# Patient Record
Sex: Female | Born: 1960 | Race: White | Hispanic: No | Marital: Single | Smoking: Former smoker
Health system: Southern US, Community
[De-identification: ages and names within clinical notes are randomized; demographics above are authoritative.]

## PROBLEM LIST (undated history)

## (undated) DIAGNOSIS — Z923 Personal history of irradiation: Secondary | ICD-10-CM

## (undated) DIAGNOSIS — C3490 Malignant neoplasm of unspecified part of unspecified bronchus or lung: Secondary | ICD-10-CM

## (undated) DIAGNOSIS — Z9221 Personal history of antineoplastic chemotherapy: Secondary | ICD-10-CM

## (undated) DIAGNOSIS — IMO0002 Reserved for concepts with insufficient information to code with codable children: Secondary | ICD-10-CM

## (undated) DIAGNOSIS — D649 Anemia, unspecified: Secondary | ICD-10-CM

## (undated) DIAGNOSIS — IMO0001 Reserved for inherently not codable concepts without codable children: Secondary | ICD-10-CM

## (undated) DIAGNOSIS — I82409 Acute embolism and thrombosis of unspecified deep veins of unspecified lower extremity: Secondary | ICD-10-CM

## (undated) DIAGNOSIS — C799 Secondary malignant neoplasm of unspecified site: Secondary | ICD-10-CM

## (undated) DIAGNOSIS — Z66 Do not resuscitate: Secondary | ICD-10-CM

## (undated) DIAGNOSIS — N390 Urinary tract infection, site not specified: Secondary | ICD-10-CM

## (undated) DIAGNOSIS — E669 Obesity, unspecified: Secondary | ICD-10-CM

## (undated) DIAGNOSIS — Z5189 Encounter for other specified aftercare: Secondary | ICD-10-CM

## (undated) DIAGNOSIS — Z8719 Personal history of other diseases of the digestive system: Secondary | ICD-10-CM

## (undated) DIAGNOSIS — Z8742 Personal history of other diseases of the female genital tract: Secondary | ICD-10-CM

## (undated) HISTORY — DX: Obesity, unspecified: E66.9

## (undated) HISTORY — DX: Acute embolism and thrombosis of unspecified deep veins of unspecified lower extremity: I82.409

## (undated) HISTORY — DX: Personal history of irradiation: Z92.3

## (undated) HISTORY — DX: Urinary tract infection, site not specified: N39.0

## (undated) HISTORY — DX: Secondary malignant neoplasm of unspecified site: C79.9

## (undated) HISTORY — DX: Do not resuscitate: Z66

## (undated) HISTORY — DX: Personal history of antineoplastic chemotherapy: Z92.21

---

## 1997-11-07 ENCOUNTER — Emergency Department (HOSPITAL_COMMUNITY): Admission: EM | Admit: 1997-11-07 | Discharge: 1997-11-07 | Payer: Self-pay | Admitting: Emergency Medicine

## 1998-02-21 ENCOUNTER — Encounter: Payer: Self-pay | Admitting: *Deleted

## 1998-02-21 ENCOUNTER — Ambulatory Visit (HOSPITAL_COMMUNITY): Admission: RE | Admit: 1998-02-21 | Discharge: 1998-02-21 | Payer: Self-pay | Admitting: *Deleted

## 1998-04-04 ENCOUNTER — Ambulatory Visit (HOSPITAL_COMMUNITY): Admission: RE | Admit: 1998-04-04 | Discharge: 1998-04-04 | Payer: Self-pay | Admitting: *Deleted

## 1998-04-04 ENCOUNTER — Encounter: Payer: Self-pay | Admitting: *Deleted

## 1998-04-19 ENCOUNTER — Ambulatory Visit (HOSPITAL_COMMUNITY): Admission: RE | Admit: 1998-04-19 | Discharge: 1998-04-19 | Payer: Self-pay | Admitting: *Deleted

## 1998-04-19 ENCOUNTER — Encounter: Payer: Self-pay | Admitting: *Deleted

## 1999-12-05 ENCOUNTER — Encounter: Payer: Self-pay | Admitting: *Deleted

## 1999-12-05 ENCOUNTER — Ambulatory Visit (HOSPITAL_COMMUNITY): Admission: RE | Admit: 1999-12-05 | Discharge: 1999-12-05 | Payer: Self-pay | Admitting: *Deleted

## 1999-12-11 ENCOUNTER — Encounter: Payer: Self-pay | Admitting: *Deleted

## 1999-12-11 ENCOUNTER — Inpatient Hospital Stay (HOSPITAL_COMMUNITY): Admission: AD | Admit: 1999-12-11 | Discharge: 1999-12-14 | Payer: Self-pay | Admitting: Internal Medicine

## 1999-12-13 ENCOUNTER — Encounter: Payer: Self-pay | Admitting: *Deleted

## 1999-12-28 ENCOUNTER — Encounter: Admission: RE | Admit: 1999-12-28 | Discharge: 1999-12-28 | Payer: Self-pay | Admitting: Internal Medicine

## 2000-02-06 ENCOUNTER — Encounter: Admission: RE | Admit: 2000-02-06 | Discharge: 2000-02-06 | Payer: Self-pay

## 2000-02-21 ENCOUNTER — Encounter: Admission: RE | Admit: 2000-02-21 | Discharge: 2000-02-21 | Payer: Self-pay | Admitting: Hematology and Oncology

## 2000-02-28 ENCOUNTER — Encounter: Admission: RE | Admit: 2000-02-28 | Discharge: 2000-02-28 | Payer: Self-pay | Admitting: Internal Medicine

## 2000-03-11 ENCOUNTER — Ambulatory Visit (HOSPITAL_COMMUNITY): Admission: RE | Admit: 2000-03-11 | Discharge: 2000-03-11 | Payer: Self-pay | Admitting: Cardiology

## 2000-03-11 ENCOUNTER — Encounter: Payer: Self-pay | Admitting: Internal Medicine

## 2000-04-02 ENCOUNTER — Encounter: Admission: RE | Admit: 2000-04-02 | Discharge: 2000-04-02 | Payer: Self-pay | Admitting: Obstetrics & Gynecology

## 2011-05-07 ENCOUNTER — Emergency Department (INDEPENDENT_AMBULATORY_CARE_PROVIDER_SITE_OTHER)
Admission: EM | Admit: 2011-05-07 | Discharge: 2011-05-07 | Disposition: A | Discharge status: Nursing Home (Includes ICF) | Payer: Self-pay | Source: Home / Self Care | Attending: Emergency Medicine | Admitting: Emergency Medicine

## 2011-05-07 ENCOUNTER — Emergency Department (HOSPITAL_COMMUNITY): Payer: Self-pay

## 2011-05-07 ENCOUNTER — Encounter (HOSPITAL_COMMUNITY): Payer: Self-pay

## 2011-05-07 ENCOUNTER — Encounter (HOSPITAL_COMMUNITY): Payer: Self-pay | Admitting: Emergency Medicine

## 2011-05-07 ENCOUNTER — Emergency Department (HOSPITAL_COMMUNITY)
Admission: EM | Admit: 2011-05-07 | Discharge: 2011-05-07 | Disposition: A | Discharge status: Home / Self Care | Payer: Self-pay | Attending: Emergency Medicine | Admitting: Emergency Medicine

## 2011-05-07 DIAGNOSIS — R110 Nausea: Secondary | ICD-10-CM | POA: Insufficient documentation

## 2011-05-07 DIAGNOSIS — K5900 Constipation, unspecified: Secondary | ICD-10-CM | POA: Insufficient documentation

## 2011-05-07 DIAGNOSIS — N858 Other specified noninflammatory disorders of uterus: Secondary | ICD-10-CM

## 2011-05-07 DIAGNOSIS — R1900 Intra-abdominal and pelvic swelling, mass and lump, unspecified site: Secondary | ICD-10-CM

## 2011-05-07 DIAGNOSIS — N9489 Other specified conditions associated with female genital organs and menstrual cycle: Secondary | ICD-10-CM | POA: Insufficient documentation

## 2011-05-07 DIAGNOSIS — R109 Unspecified abdominal pain: Secondary | ICD-10-CM | POA: Insufficient documentation

## 2011-05-07 LAB — CBC
Platelets: 392 10*3/uL (ref 150–400)
RBC: 4.45 MIL/uL (ref 3.87–5.11)
WBC: 17.2 10*3/uL — ABNORMAL HIGH (ref 4.0–10.5)

## 2011-05-07 LAB — URINALYSIS, ROUTINE W REFLEX MICROSCOPIC
Glucose, UA: NEGATIVE mg/dL
Hgb urine dipstick: NEGATIVE
Leukocytes, UA: NEGATIVE
Protein, ur: 30 mg/dL — AB
Specific Gravity, Urine: 1.016 (ref 1.005–1.030)
Urobilinogen, UA: 0.2 mg/dL (ref 0.0–1.0)

## 2011-05-07 LAB — POCT URINALYSIS DIP (DEVICE)
Glucose, UA: NEGATIVE mg/dL
Nitrite: NEGATIVE
Urobilinogen, UA: 0.2 mg/dL (ref 0.0–1.0)

## 2011-05-07 LAB — URINE MICROSCOPIC-ADD ON

## 2011-05-07 LAB — COMPREHENSIVE METABOLIC PANEL
ALT: 8 U/L (ref 0–35)
Alkaline Phosphatase: 62 U/L (ref 39–117)
CO2: 24 mEq/L (ref 19–32)
Glucose, Bld: 95 mg/dL (ref 70–99)
Potassium: 3.8 mEq/L (ref 3.5–5.1)
Sodium: 138 mEq/L (ref 135–145)
Total Protein: 8.2 g/dL (ref 6.0–8.3)

## 2011-05-07 LAB — DIFFERENTIAL
Lymphocytes Relative: 13 % (ref 12–46)
Lymphs Abs: 2.2 10*3/uL (ref 0.7–4.0)
Neutro Abs: 13.5 10*3/uL — ABNORMAL HIGH (ref 1.7–7.7)
Neutrophils Relative %: 78 % — ABNORMAL HIGH (ref 43–77)

## 2011-05-07 LAB — LIPASE, BLOOD: Lipase: 33 U/L (ref 11–59)

## 2011-05-07 LAB — PREGNANCY, URINE: Preg Test, Ur: NEGATIVE

## 2011-05-07 MED ORDER — HYDROMORPHONE HCL PF 1 MG/ML IJ SOLN
1.0000 mg | Freq: Once | INTRAMUSCULAR | Status: AC
  Administered 2011-05-07: 1 mg via INTRAVENOUS
  Filled 2011-05-07: qty 1

## 2011-05-07 MED ORDER — IOHEXOL 300 MG/ML  SOLN
100.0000 mL | Freq: Once | INTRAMUSCULAR | Status: AC | PRN
  Administered 2011-05-07: 100 mL via INTRAVENOUS

## 2011-05-07 MED ORDER — OXYCODONE-ACETAMINOPHEN 5-325 MG PO TABS
2.0000 | ORAL_TABLET | Freq: Once | ORAL | Status: AC
  Administered 2011-05-07: 2 via ORAL
  Filled 2011-05-07: qty 2

## 2011-05-07 MED ORDER — SODIUM CHLORIDE 0.9 % IV SOLN
Freq: Once | INTRAVENOUS | Status: AC
  Administered 2011-05-07: 19:00:00 via INTRAVENOUS

## 2011-05-07 MED ORDER — PROMETHAZINE HCL 25 MG PO TABS: 25.0000 mg | ORAL_TABLET | Freq: Four times a day (QID) | ORAL | Status: DC | PRN

## 2011-05-07 MED ORDER — ONDANSETRON HCL 4 MG/2ML IJ SOLN
4.0000 mg | Freq: Once | INTRAMUSCULAR | Status: AC
Start: 2011-05-07 — End: 2011-05-07
  Administered 2011-05-07: 4 mg via INTRAVENOUS
  Filled 2011-05-07: qty 2

## 2011-05-07 MED ORDER — OXYCODONE-ACETAMINOPHEN 5-325 MG PO TABS: 2.0000 | ORAL_TABLET | ORAL | Status: DC | PRN

## 2011-05-07 NOTE — ED Notes (Signed)
C/o lower abdominal pain that radiates around to her lower back for 3 weeks.  States has been getting progressively worse.  States temp of 100 last night.  States she has a lot of abdominal pressure before urination but no painful urination or hematuria.  Lance Bosch has experienced some nausea but denies vomiting, diarrhea or constipation.

## 2011-05-07 NOTE — ED Provider Notes (Signed)
History     CSN: [id number]  Arrival date & time 05/07/11  1547   First MD Initiated Contact with Patient 05/07/11 1807      Chief Complaint  Patient presents with  . Abdominal Pain    (Consider location/radiation/quality/duration/timing/severity/associated sxs/prior treatment) HPI  History reviewed. No pertinent past medical history.  History reviewed. No pertinent past surgical history.  No family history on file.  History  Substance Use Topics  . Smoking status: Current Everyday Smoker -- 1.0 packs/day  . Smokeless tobacco: Not on file  . Alcohol Use: No    OB History    Grav Para Term Preterm Abortions TAB SAB Ect Mult Living                  Review of Systems  Allergies  Aspirin; Morphine and related; and Penicillins  Home Medications  No current outpatient prescriptions on file.  BP 118/70  Pulse 91  Temp(Src) 99 F (37.2 C) (Oral)  Resp 18  SpO2 97%  LMP 03/30/2011  Physical Exam  ED Course  Procedures (including critical care time)   Labs Reviewed  CBC  DIFFERENTIAL  COMPREHENSIVE METABOLIC PANEL  URINALYSIS, ROUTINE W REFLEX MICROSCOPIC  PREGNANCY, URINE  LIPASE, BLOOD   No results found.   No diagnosis found.    MDM  2000 Report received from Drs. Estell Harpin on a 51 year old female presenting with abdominal pain and lower back pain x3 weeks. Palpable abdominal mass. Will be moving her to CDU for a CT of the abdomen after her pregnancy test is back.  10pm Radiologist reports that Jackie Cook has a large uterine mass with right hydronephrosis and lymph edema.    63 Spoke with GYN Oncology nurse Telford Nab.  Will call in am for appointment to schedule biopsy.    10:30  Spoke with urologist about the R hydronephrosis and he said she can follow up tomorrow at the office.         Jethro Bastos, NP 05/08/11 1143

## 2011-05-07 NOTE — ED Notes (Signed)
Pt c/o pain at periumbilical area radiating to lower abd and into back.  Onset 3 weeks ago.  Pt was seen at Urgent Care and sent to ED.  Pt denies any vomiting

## 2011-05-07 NOTE — ED Provider Notes (Signed)
History     CSN: [id number]  Arrival date & time 05/07/11  1357   First MD Initiated Contact with Patient 05/07/11 1434      Chief Complaint  Patient presents with  . Abdominal Pain  . Back Pain    (Consider location/radiation/quality/duration/timing/severity/associated sxs/prior treatment) HPI Comments: The pain and pressure has been getting worse" " the pain shoots to my back goes from my sides" (points to infraumbilical region), feeling chills - nauseous" For week been feeling some pressure" But it has gotten worse " " I don't have insurance",  Patient is a 51 y.o. female presenting with abdominal pain and back pain. The history is provided by the patient.  Abdominal Pain The primary symptoms of the illness include abdominal pain, fever, fatigue and nausea. The primary symptoms of the illness do not include shortness of breath, vomiting, diarrhea, dysuria, vaginal discharge or vaginal bleeding. The problem has been gradually worsening.  The patient states that she believes she is currently not pregnant. Additional symptoms associated with the illness include chills, anorexia and back pain. Symptoms associated with the illness do not include constipation.  Back Pain  Associated symptoms include a fever and abdominal pain. Pertinent negatives include no dysuria.    History reviewed. No pertinent past medical history.  History reviewed. No pertinent past surgical history.  No family history on file.  History  Substance Use Topics  . Smoking status: Current Everyday Smoker -- 1.0 packs/day  . Smokeless tobacco: Not on file  . Alcohol Use: No    OB History    Grav Para Term Preterm Abortions TAB SAB Ect Mult Living                  Review of Systems  Constitutional: Positive for fever, chills and fatigue.  Respiratory: Negative for shortness of breath.   Gastrointestinal: Positive for nausea, abdominal pain and anorexia. Negative for vomiting, diarrhea and constipation.    Genitourinary: Negative for dysuria, vaginal bleeding and vaginal discharge.  Musculoskeletal: Positive for back pain.    Allergies  Aspirin; Morphine and related; and Penicillins  Home Medications  No current outpatient prescriptions on file.  BP 151/83  Pulse 90  Temp(Src) 99.7 F (37.6 C) (Oral)  Resp 17  SpO2 100%  LMP 03/30/2011  Physical Exam  Nursing note and vitals reviewed. Constitutional: She appears well-developed. No distress.  HENT:  Head: Normocephalic.  Eyes: Scleral icterus is present.  Neck: Neck supple.  Abdominal: She exhibits mass. She exhibits no pulsatile liver. There is tenderness. There is no guarding.    Skin: No rash noted.    ED Course  Procedures (including critical care time)  Labs Reviewed  POCT URINALYSIS DIP (DEVICE) - Abnormal; Notable for the following:    Hgb urine dipstick TRACE (*)    All other components within normal limits  POCT URINALYSIS DIPSTICK   No results found.   No diagnosis found.    MDM  Lower abdominal pain- for 3 weeks with posterior back radiation. No dysuria. Reports low grade fevers- palpable abdominal mass and icterus noted- Suspect oncological pelvic process-Patient transferred to the ED for further evaluation        Jimmie Molly, MD 05/07/11 1559

## 2011-05-07 NOTE — ED Provider Notes (Signed)
History     CSN: [id number]  Arrival date & time 05/07/11  1547   First MD Initiated Contact with Patient 05/07/11 1807      Chief Complaint  Patient presents with  . Abdominal Pain    (Consider location/radiation/quality/duration/timing/severity/associated sxs/prior treatment) Patient is a 51 y.o. female presenting with abdominal pain. The history is provided by the patient (Patient complains of lower abdominal pain for 3 weeks mild nausea no vomiting.). No language interpreter was used.  Abdominal Pain The primary symptoms of the illness include abdominal pain. The primary symptoms of the illness do not include fatigue, shortness of breath or diarrhea. The current episode started more than 2 days ago. The onset of the illness was gradual. The problem has been gradually worsening.  Associated with: nothing. The patient states that she believes she is currently not pregnant. The patient has not had a change in bowel habit. Additional symptoms associated with the illness include constipation. Symptoms associated with the illness do not include heartburn, hematuria, frequency or back pain. Significant associated medical issues do not include diverticulitis.    History reviewed. No pertinent past medical history.  History reviewed. No pertinent past surgical history.  No family history on file.  History  Substance Use Topics  . Smoking status: Current Everyday Smoker -- 1.0 packs/day  . Smokeless tobacco: Not on file  . Alcohol Use: No    OB History    Grav Para Term Preterm Abortions TAB SAB Ect Mult Living                  Review of Systems  Constitutional: Negative for fatigue.  HENT: Negative for congestion, sinus pressure and ear discharge.   Eyes: Negative for discharge.  Respiratory: Negative for cough and shortness of breath.   Cardiovascular: Negative for chest pain.  Gastrointestinal: Positive for abdominal pain and constipation. Negative for heartburn and  diarrhea.  Genitourinary: Negative for frequency and hematuria.  Musculoskeletal: Negative for back pain.  Skin: Negative for rash.  Neurological: Negative for seizures and headaches.  Hematological: Negative.   Psychiatric/Behavioral: Negative for hallucinations.    Allergies  Aspirin; Morphine and related; and Penicillins  Home Medications  No current outpatient prescriptions on file.  BP 118/70  Pulse 91  Temp(Src) 99 F (37.2 C) (Oral)  Resp 18  SpO2 97%  LMP 03/30/2011  Physical Exam  Constitutional: She is oriented to person, place, and time. She appears well-developed.  HENT:  Head: Normocephalic and atraumatic.  Eyes: Conjunctivae and EOM are normal. No scleral icterus.  Neck: Neck supple. No thyromegaly present.  Cardiovascular: Normal rate and regular rhythm.  Exam reveals no gallop and no friction rub.   No murmur heard. Pulmonary/Chest: No stridor. She has no wheezes. She has no rales. She exhibits no tenderness.  Abdominal: She exhibits no distension. There is tenderness. There is no rebound.       Pt has a large mass inferior to her umbilicus  Musculoskeletal: Normal range of motion. She exhibits no edema.  Lymphadenopathy:    She has no cervical adenopathy.  Neurological: She is oriented to person, place, and time. Coordination normal.  Skin: No rash noted. No erythema.  Psychiatric: She has a normal mood and affect. Her behavior is normal.    ED Course  Procedures (including critical care time)  Labs Reviewed  CBC - Abnormal; Notable for the following:    WBC 17.2 (*)    Hemoglobin 11.9 (*)    HCT 34.2 (*)  MCV 76.9 (*)    RDW 15.6 (*)    All other components within normal limits  DIFFERENTIAL - Abnormal; Notable for the following:    Neutrophils Relative 78 (*)    Neutro Abs 13.5 (*)    Monocytes Absolute 1.2 (*)    All other components within normal limits  COMPREHENSIVE METABOLIC PANEL - Abnormal; Notable for the following:    GFR calc  non Af Amer 65 (*)    GFR calc Af Amer 76 (*)    All other components within normal limits  URINALYSIS, ROUTINE W REFLEX MICROSCOPIC - Abnormal; Notable for the following:    APPearance HAZY (*)    Protein, ur 30 (*)    All other components within normal limits  URINE MICROSCOPIC-ADD ON - Abnormal; Notable for the following:    Squamous Epithelial / LPF MANY (*)    Bacteria, UA MANY (*)    All other components within normal limits  LIPASE, BLOOD  POCT PREGNANCY, URINE  PREGNANCY, URINE   No results found.   No diagnosis found. Results for orders placed during the hospital encounter of 05/07/11  CBC      Component Value Range   WBC 17.2 (*) 4.0 - 10.5 (K/uL)   RBC 4.45  3.87 - 5.11 (MIL/uL)   Hemoglobin 11.9 (*) 12.0 - 15.0 (g/dL)   HCT 96.0 (*) 45.4 - 46.0 (%)   MCV 76.9 (*) 78.0 - 100.0 (fL)   MCH 26.7  26.0 - 34.0 (pg)   MCHC 34.8  30.0 - 36.0 (g/dL)   RDW 09.8 (*) 11.9 - 15.5 (%)   Platelets 392  150 - 400 (K/uL)  DIFFERENTIAL      Component Value Range   Neutrophils Relative 78 (*) 43 - 77 (%)   Neutro Abs 13.5 (*) 1.7 - 7.7 (K/uL)   Lymphocytes Relative 13  12 - 46 (%)   Lymphs Abs 2.2  0.7 - 4.0 (K/uL)   Monocytes Relative 7  3 - 12 (%)   Monocytes Absolute 1.2 (*) 0.1 - 1.0 (K/uL)   Eosinophils Relative 2  0 - 5 (%)   Eosinophils Absolute 0.3  0.0 - 0.7 (K/uL)   Basophils Relative 0  0 - 1 (%)   Basophils Absolute 0.0  0.0 - 0.1 (K/uL)  COMPREHENSIVE METABOLIC PANEL      Component Value Range   Sodium 138  135 - 145 (mEq/L)   Potassium 3.8  3.5 - 5.1 (mEq/L)   Chloride 103  96 - 112 (mEq/L)   CO2 24  19 - 32 (mEq/L)   Glucose, Bld 95  70 - 99 (mg/dL)   BUN 8  6 - 23 (mg/dL)   Creatinine, Ser 1.47  0.50 - 1.10 (mg/dL)   Calcium 82.9  8.4 - 10.5 (mg/dL)   Total Protein 8.2  6.0 - 8.3 (g/dL)   Albumin 3.5  3.5 - 5.2 (g/dL)   AST 13  0 - 37 (U/L)   ALT 8  0 - 35 (U/L)   Alkaline Phosphatase 62  39 - 117 (U/L)   Total Bilirubin 0.8  0.3 - 1.2 (mg/dL)   GFR  calc non Af Amer 65 (*) >90 (mL/min)   GFR calc Af Amer 76 (*) >90 (mL/min)  URINALYSIS, ROUTINE W REFLEX MICROSCOPIC      Component Value Range   Color, Urine YELLOW  YELLOW    APPearance HAZY (*) CLEAR    Specific Gravity, Urine 1.016  1.005 - 1.030  pH 6.0  5.0 - 8.0    Glucose, UA NEGATIVE  NEGATIVE (mg/dL)   Hgb urine dipstick NEGATIVE  NEGATIVE    Bilirubin Urine NEGATIVE  NEGATIVE    Ketones, ur NEGATIVE  NEGATIVE (mg/dL)   Protein, ur 30 (*) NEGATIVE (mg/dL)   Urobilinogen, UA 0.2  0.0 - 1.0 (mg/dL)   Nitrite NEGATIVE  NEGATIVE    Leukocytes, UA NEGATIVE  NEGATIVE   PREGNANCY, URINE      Component Value Range   Preg Test, Ur NEGATIVE    LIPASE, BLOOD      Component Value Range   Lipase 33  11 - 59 (U/L)  POCT PREGNANCY, URINE      Component Value Range   Preg Test, Ur NEGATIVE    URINE MICROSCOPIC-ADD ON      Component Value Range   Squamous Epithelial / LPF MANY (*) RARE    WBC, UA 0-2  <3 (WBC/hpf)   Bacteria, UA MANY (*) RARE    Urine-Other AMORPHOUS URATES/PHOSPHATES     Ct Abdomen Pelvis W Contrast  05/07/2011  *RADIOLOGY REPORT*  Clinical Data: Abdominal pain radiating into the groin.  Nausea and chills.  CT ABDOMEN AND PELVIS WITH CONTRAST  Technique:  Multidetector CT imaging of the abdomen and pelvis was performed following the standard protocol during bolus administration of intravenous contrast.  Contrast: OMNIPAQUE IOHEXOL 300 MG/ML IV SOLN  Comparison: None.  Findings: The lung bases are clear.  No pleural or pericardial effusion.  The patient has a large mass in the pelvis measuring approximately 20 cm AP by 16.7 cm cranial-caudal by 16 cm transverse.  Although difficult to definitively characterize, this appears to be a massively enlarged uterus.  There is a large area of central low attenuation superiorly within this mass compatible with necrosis. A second pedunculated lesion is seen off the left aspect of the main mass which measures 10.3 cm AP by 8.8  cm transverse by 10.7 cm cranial-caudal.  There is also a low attenuating collection along the right aspect of the main mass measuring 8.3 cm AP by 5.3 cm transverse by 7.7 cm cranial-caudal.  Posterior to the main lesion along the right iliac vessels, there is a large nodal mass measuring 11.1 cm transverse by 7.5 cm AP.  This lesion encases the right iliac vessels.  The patient has severe right hydronephrosis with the point of compression of the right ureter between the nodal mass and the main pelvic mass.  Multiple smaller retroperitoneal lymph nodes are seen cephalad to the main lesion.  The urinary bladder is nearly completely decompressed.  Ovaries are not discretely visualized.  The liver, spleen, adrenal glands and left kidney are unremarkable. Punctate nonobstructing stone lower pole right kidney is noted. The stomach and small and large bowel appear normal.  No focal bony abnormality.  IMPRESSION:  1.  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  There is associated bulky lymphadenopathy in the pelvis with encasement and compression of the right iliac vessels.  Severe right hydronephrosis is again noted with the point of obstruction between the patient's dominant mass and right pelvic lymph nodes. 2.  Nonobstructing stone lower pole right kidney. 3.  Critical Value/emergent results were called by telephone at the time of interpretation on 05/07/2011  at 10:00 p.m.  to Thurston Hole, nurse practitioner, who verbally acknowledged these results.  Original Report Authenticated By: Bernadene Bell. Maricela Curet, M.D.       MDM  Benny Lennert, MD 05/08/11 1209

## 2011-05-08 NOTE — ED Provider Notes (Signed)
Medical screening examination/treatment/procedure(s) were performed by non-physician practitioner and as supervising physician I was immediately available for consultation/collaboration.   Kitiara Hintze L Cedra Villalon, MD 05/08/11 1202 

## 2011-05-10 ENCOUNTER — Encounter: Payer: Self-pay | Admitting: Gynecologic Oncology

## 2011-05-11 ENCOUNTER — Ambulatory Visit: Payer: Self-pay | Attending: Gynecology | Admitting: Gynecology

## 2011-05-11 ENCOUNTER — Encounter: Payer: Self-pay | Admitting: Gynecology

## 2011-05-11 ENCOUNTER — Other Ambulatory Visit (HOSPITAL_COMMUNITY)
Admission: RE | Admit: 2011-05-11 | Discharge: 2011-05-11 | Disposition: A | Discharge status: Home / Self Care | Payer: Self-pay | Source: Ambulatory Visit | Attending: Gynecology | Admitting: Gynecology

## 2011-05-11 VITALS — BP 128/62 | HR 80 | Temp 98.9°F | Resp 16 | Ht 65.0 in | Wt 235.4 lb

## 2011-05-11 DIAGNOSIS — R1909 Other intra-abdominal and pelvic swelling, mass and lump: Secondary | ICD-10-CM | POA: Insufficient documentation

## 2011-05-11 DIAGNOSIS — R634 Abnormal weight loss: Secondary | ICD-10-CM | POA: Insufficient documentation

## 2011-05-11 DIAGNOSIS — Z01419 Encounter for gynecological examination (general) (routine) without abnormal findings: Secondary | ICD-10-CM | POA: Insufficient documentation

## 2011-05-11 DIAGNOSIS — R1900 Intra-abdominal and pelvic swelling, mass and lump, unspecified site: Secondary | ICD-10-CM | POA: Insufficient documentation

## 2011-05-11 DIAGNOSIS — F17200 Nicotine dependence, unspecified, uncomplicated: Secondary | ICD-10-CM | POA: Insufficient documentation

## 2011-05-11 NOTE — Patient Instructions (Signed)
OR is scheduled for February 5

## 2011-05-11 NOTE — Progress Notes (Signed)
Consult Note: Gyn-Onc   Jackie Cook 51 y.o. female  Chief Complaint  Patient presents with  . Pelvic Mass    New pt       HPI:  51-year-old white female seen in consultation and management of a newly diagnosed abdominal pelvic mass.  The patient has had approximately 3 weeks of abdominal pain and saw her primary care provider who found a large mass on exam. Subsequently she's had a CT scan which shows a 20 x 16 x 16 cm abdominal pelvic mass. This is thought to be a large uterus with a central area of low attenuation consistent with necrosis. There is some evidence of a pedunculated mass measuring 10 x 8.8 x 10.7 cm posterior to the main lesion along the right iliac vessels is a large nodal mass measuring 11 x 7.5 cm which encased the iliac vessels and ureter creating a right hydronephrosis which is severe. The liver spleen and adrenal glands and left kidney are otherwise unremarkable there is no evidence of ascites. The patient denies any past gynecologic history. She reports she continues to have regular cyclic menses. She has not had a Pap smear in several years.  Allergies  Allergen Reactions  . Aspirin     GI upset  . Morphine And Related Itching  . Penicillins Itching    History reviewed. No pertinent past medical history.  Past Surgical History  Procedure Date  . No past surgeries     Current Outpatient Prescriptions  Medication Sig Dispense Refill  . Multiple Vitamins-Minerals (MULTIVITAMIN PO) Take 1 tablet by mouth daily.      . oxyCODONE-acetaminophen (PERCOCET) 5-325 MG per tablet Take 2 tablets by mouth every 4 (four) hours as needed for pain.  20 tablet  0  . promethazine (PHENERGAN) 25 MG tablet Take 1 tablet (25 mg total) by mouth every 6 (six) hours as needed for nausea.  30 tablet  0    History   Social History  . Marital Status: Single    Spouse Name: N/A    Number of Children: N/A  . Years of Education: N/A   Occupational History  . Not on  file.   Social History Main Topics  . Smoking status: Current Everyday Smoker -- 1.0 packs/day  . Smokeless tobacco: Not on file  . Alcohol Use: No  . Drug Use: No  . Sexually Active: No   Other Topics Concern  . Not on file   Social History Narrative  . No narrative on file    Family History  Problem Relation Age of Onset  . Breast cancer Maternal Aunt   . Heart attack Other     Review of Systems: The patient admits to 50 pound weight loss over the past 6 months. Otherwise her review of systems is negative except as noted above.  Vitals: Blood pressure 128/62, pulse 80, temperature 98.9 F (37.2 C), temperature source Oral, resp. rate 16, height 5' 5" (1.651 m), weight 235 lb 6.4 oz (106.777 kg), last menstrual period 04/09/2011.  Physical Exam: In general the patient is a pleasant but anxious white female in no acute distress  HEENT is negative  Neck is supple without thyromegaly.  There is no supraclavicular or inguinal adenopathy.  Abdominal exam reveals a large mass extending from the lower abdomen into the upper abdomen. This is moderately painful without any rebound. I do not detect any ascites.  Ellik exam  EGBUS vagina bladder urethra are normal.  The cervix is deviated   far anteriorly and superiorly and cannot be identified with a speculum nor can I palpate it with my vaginal exam finger. Bimanual and rectovaginal exam revealed no masses deep in the pelvis. There is no cul-de-sac nodularity. 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 The unusual features it is the nodal mass on the right side of the pelvis obstructing the ureter and encasing the blood vessels.  A Pap smear was obtained but I was unable to obtain an endometrial biopsy  .Assessment/Plan:  I recommend that the patient undergo exploratory laparotomy with an attempt to resect the majority of the mass which I believe is the uterus. I did indicate to the  patient that it is unlikely we would be able to resect the right pelvic sidewall mass. Clearly we need to establish a diagnosis before any further therapy can be recommended. She is aware that this is major surgery and carries risks including hemorrhage infection injury to adjacent viscera thromboembolic complications and anesthetic risks. She also understands that she will likely require postoperative therapy. All her questions are answered. She would like to move ahead with surgery as soon as possible and we'll therefore scheduled for February 5. She understands and Dr. Wendy Brewster she will be the primary surgeon.  We will attempt to have a right ureteral stent placed preoperatively.     CLARKE-PEARSON,[name], MD 05/11/2011, 12:45 PM                         Consult Note: Gyn-Onc   Jackie Cook 51 y.o. female  Chief Complaint  Patient presents with  . Pelvic Mass    New pt    Interval History:   HPI:  Allergies  Allergen Reactions  . Aspirin     GI upset  . Morphine And Related Itching  . Penicillins Itching    History reviewed. No pertinent past medical history.  Past Surgical History  Procedure Date  . No past surgeries     Current Outpatient Prescriptions  Medication Sig Dispense Refill  . Multiple Vitamins-Minerals (MULTIVITAMIN PO) Take 1 tablet by mouth daily.      . oxyCODONE-acetaminophen (PERCOCET) 5-325 MG per tablet Take 2 tablets by mouth every 4 (four) hours as needed for pain.  20 tablet  0  . promethazine (PHENERGAN) 25 MG tablet Take 1 tablet (25 mg total) by mouth every 6 (six) hours as needed for nausea.  30 tablet  0    History   Social History  . Marital Status: Single    Spouse Name: N/A    Number of Children: N/A  . Years of Education: N/A   Occupational History  . Not on file.   Social History Main Topics  . Smoking status: Current Everyday Smoker -- 1.0 packs/day  . Smokeless tobacco: Not on file  .  Alcohol Use: No  . Drug Use: No  . Sexually Active: No   Other Topics Concern  . Not on file   Social History Narrative  . No narrative on file    Family History  Problem Relation Age of Onset  . Breast cancer Maternal Aunt   . Heart attack Other     Review of Systems:  Vitals: Blood pressure 128/62, pulse 80, temperature 98.9 F (37.2 C), temperature source Oral, resp. rate 16, height 5' 5" (1.651 m), weight 235 lb 6.4 oz (106.777 kg), last menstrual period 04/09/2011.  Physical Exam:  Assessment/Plan:     CLARKE-PEARSON,Tera Pellicane L, MD 05/11/2011, 12:46 PM                         

## 2011-05-11 NOTE — Progress Notes (Signed)
Addended by: Randel Pigg on: 05/11/2011 01:21 PM   Modules accepted: Orders

## 2011-05-14 ENCOUNTER — Encounter (HOSPITAL_COMMUNITY): Payer: Self-pay

## 2011-05-18 ENCOUNTER — Ambulatory Visit (HOSPITAL_COMMUNITY)
Admission: RE | Admit: 2011-05-18 | Discharge: 2011-05-18 | Disposition: A | Discharge status: Home / Self Care | Payer: Self-pay | Source: Ambulatory Visit | Attending: Gynecologic Oncology | Admitting: Gynecologic Oncology

## 2011-05-18 ENCOUNTER — Encounter (HOSPITAL_COMMUNITY)
Admission: RE | Admit: 2011-05-18 | Discharge: 2011-05-18 | Disposition: A | Discharge status: Home / Self Care | Payer: Self-pay | Source: Ambulatory Visit | Attending: Obstetrics & Gynecology | Admitting: Obstetrics & Gynecology

## 2011-05-18 ENCOUNTER — Encounter (HOSPITAL_COMMUNITY): Payer: Self-pay

## 2011-05-18 DIAGNOSIS — Z01812 Encounter for preprocedural laboratory examination: Secondary | ICD-10-CM | POA: Insufficient documentation

## 2011-05-18 DIAGNOSIS — R918 Other nonspecific abnormal finding of lung field: Secondary | ICD-10-CM | POA: Insufficient documentation

## 2011-05-18 DIAGNOSIS — Z01818 Encounter for other preprocedural examination: Secondary | ICD-10-CM | POA: Insufficient documentation

## 2011-05-18 HISTORY — DX: Anemia, unspecified: D64.9

## 2011-05-18 HISTORY — DX: Encounter for other specified aftercare: Z51.89

## 2011-05-18 HISTORY — DX: Reserved for inherently not codable concepts without codable children: IMO0001

## 2011-05-18 HISTORY — DX: Personal history of other diseases of the digestive system: Z87.19

## 2011-05-18 LAB — SURGICAL PCR SCREEN
MRSA, PCR: NEGATIVE
Staphylococcus aureus: NEGATIVE

## 2011-05-18 LAB — COMPREHENSIVE METABOLIC PANEL
ALT: 11 U/L (ref 0–35)
BUN: 8 mg/dL (ref 6–23)
CO2: 28 mEq/L (ref 19–32)
Calcium: 10.3 mg/dL (ref 8.4–10.5)
Creatinine, Ser: 1.05 mg/dL (ref 0.50–1.10)
GFR calc Af Amer: 70 mL/min — ABNORMAL LOW (ref >90)
GFR calc non Af Amer: 60 mL/min — ABNORMAL LOW (ref >90)
Glucose, Bld: 92 mg/dL (ref 70–99)
Sodium: 138 mEq/L (ref 135–145)
Total Protein: 8.1 g/dL (ref 6.0–8.3)

## 2011-05-18 LAB — DIFFERENTIAL
Basophils Absolute: 0.1 10*3/uL (ref 0.0–0.1)
Eosinophils Relative: 3 % (ref 0–5)
Lymphocytes Relative: 13 % (ref 12–46)
Lymphs Abs: 2 10*3/uL (ref 0.7–4.0)
Neutro Abs: 11.6 10*3/uL — ABNORMAL HIGH (ref 1.7–7.7)
Neutrophils Relative %: 77 % (ref 43–77)

## 2011-05-18 LAB — CBC
Hemoglobin: 11.3 g/dL — ABNORMAL LOW (ref 12.0–15.0)
MCH: 26.5 pg (ref 26.0–34.0)
MCHC: 34.6 g/dL (ref 30.0–36.0)
MCV: 76.8 fL — ABNORMAL LOW (ref 78.0–100.0)
RBC: 4.26 MIL/uL (ref 3.87–5.11)

## 2011-05-18 LAB — HCG, SERUM, QUALITATIVE: Preg, Serum: NEGATIVE

## 2011-05-18 NOTE — Pre-Procedure Instructions (Signed)
05/18/11 Nurse also made Dr Nelly Rout aware of pltc count of 435.

## 2011-05-18 NOTE — Pre-Procedure Instructions (Signed)
05/18/11 1550pm Paged Dr Laurette Schimke at [phone] regarding lab and CXR results.  Dr Nelly Rout returned page and cxr results ( Impression ) of CXR done 05/18/11 read to Dr Nelly Rout along with white count result of 15.2.  Dr Nelly Rout stated she would followup with Dr Kemper DurieSharol Given with these results since he saw the pt in office.  Dr Nelly Rout asked regarding what kind of bowel prep instructions the pt had received.  I told her the pt discussed at the preop visit drinking Gatorade and Miralax and being on clear liquids starting on 05/21/11.  Dr Nelly Rout stated she would follow thru with DR Stanford Breed and pt.

## 2011-05-18 NOTE — Patient Instructions (Signed)
[address] [name]  05/18/2011   Your procedure is scheduled on:  05/22/11 145pm-405pm  Report to [facility] at 1145 AM.  Call this number if you have problems the morning of surgery: [phone]   Remember:   Do not eat food:After Midnight.    Take these medicines the morning of surgery with A SIP OF WATER:    Do not wear jewelry, make-up or nail polish.  Do not wear lotions, powders, or perfumes.   Do not shave 48 hours prior to surgery.  Do not bring valuables to the hospital.  Contacts, dentures or bridgework may not be worn into surgery.  Leave suitcase in the car. After surgery it may be brought to your room.  For patients admitted to the hospital, checkout time is 11:00 AM the day of discharge.       Special Instructions: CHG Shower Use Special Wash: 1/2 bottle night before surgery and 1/2 bottle morning of surgery. Shower chin to toes with CHG.  Wash face and private parts with regular soap.     Please read over the following fact sheets that you were given: MRSA Information, coughing and deep breathing exercises, leg exercises, Blood Transfusion Fact Sheet

## 2011-05-22 ENCOUNTER — Encounter (HOSPITAL_COMMUNITY): Payer: Self-pay | Admitting: *Deleted

## 2011-05-22 ENCOUNTER — Inpatient Hospital Stay (HOSPITAL_COMMUNITY)
Admission: RE | Admit: 2011-05-22 | Discharge: 2011-05-26 | DRG: 740 | Disposition: A | Discharge status: Home / Self Care | Payer: Medicaid Other | Source: Ambulatory Visit | Attending: Obstetrics & Gynecology | Admitting: Obstetrics & Gynecology

## 2011-05-22 ENCOUNTER — Encounter (HOSPITAL_COMMUNITY): Payer: Self-pay | Admitting: Certified Registered Nurse Anesthetist

## 2011-05-22 ENCOUNTER — Other Ambulatory Visit: Payer: Self-pay | Admitting: Gynecologic Oncology

## 2011-05-22 ENCOUNTER — Encounter (HOSPITAL_COMMUNITY)
Admission: RE | Disposition: A | Discharge status: Home / Self Care | Payer: Self-pay | Source: Ambulatory Visit | Attending: Obstetrics & Gynecology

## 2011-05-22 ENCOUNTER — Inpatient Hospital Stay (HOSPITAL_COMMUNITY): Payer: Medicaid Other | Admitting: Certified Registered Nurse Anesthetist

## 2011-05-22 DIAGNOSIS — R000 Tachycardia, unspecified: Secondary | ICD-10-CM | POA: Diagnosis not present

## 2011-05-22 DIAGNOSIS — I519 Heart disease, unspecified: Secondary | ICD-10-CM | POA: Diagnosis not present

## 2011-05-22 DIAGNOSIS — R1900 Intra-abdominal and pelvic swelling, mass and lump, unspecified site: Secondary | ICD-10-CM

## 2011-05-22 DIAGNOSIS — D62 Acute posthemorrhagic anemia: Secondary | ICD-10-CM | POA: Diagnosis not present

## 2011-05-22 DIAGNOSIS — Y836 Removal of other organ (partial) (total) as the cause of abnormal reaction of the patient, or of later complication, without mention of misadventure at the time of the procedure: Secondary | ICD-10-CM | POA: Diagnosis not present

## 2011-05-22 DIAGNOSIS — C55 Malignant neoplasm of uterus, part unspecified: Principal | ICD-10-CM | POA: Diagnosis present

## 2011-05-22 HISTORY — PX: LAPAROTOMY: SHX154

## 2011-05-22 HISTORY — PX: ABDOMINAL HYSTERECTOMY: SHX81

## 2011-05-22 HISTORY — PX: TOTAL ABDOMINAL HYSTERECTOMY W/ BILATERAL SALPINGOOPHORECTOMY: SHX83

## 2011-05-22 HISTORY — PX: SALPINGOOPHORECTOMY: SHX82

## 2011-05-22 LAB — PREPARE RBC (CROSSMATCH)

## 2011-05-22 SURGERY — LAPAROTOMY, EXPLORATORY
Anesthesia: General | Site: Abdomen | Wound class: Clean Contaminated

## 2011-05-22 MED ORDER — FENTANYL CITRATE 0.05 MG/ML IJ SOLN
25.0000 ug | INTRAMUSCULAR | Status: DC | PRN
  Administered 2011-05-22 (×2): 50 ug via INTRAVENOUS
  Administered 2011-05-22: 25 ug via INTRAVENOUS

## 2011-05-22 MED ORDER — HYDROMORPHONE HCL PF 1 MG/ML IJ SOLN
INTRAMUSCULAR | Status: DC | PRN
  Administered 2011-05-22: 0.5 mg via INTRAVENOUS
  Administered 2011-05-22: 1 mg via INTRAVENOUS
  Administered 2011-05-22: 0.5 mg via INTRAVENOUS

## 2011-05-22 MED ORDER — LACTATED RINGERS IV SOLN
INTRAVENOUS | Status: DC
  Administered 2011-05-22: 1000 mL via INTRAVENOUS

## 2011-05-22 MED ORDER — ENOXAPARIN SODIUM 40 MG/0.4ML ~~LOC~~ SOLN
40.0000 mg | SUBCUTANEOUS | Status: AC
  Administered 2011-05-22: 40 mg via SUBCUTANEOUS

## 2011-05-22 MED ORDER — KCL IN DEXTROSE-NACL 20-5-0.45 MEQ/L-%-% IV SOLN
INTRAVENOUS | Status: DC
  Administered 2011-05-22 – 2011-05-23 (×3): via INTRAVENOUS
  Filled 2011-05-22 (×13): qty 1000

## 2011-05-22 MED ORDER — DIPHENHYDRAMINE HCL 12.5 MG/5ML PO ELIX: 12.5000 mg | ORAL_SOLUTION | Freq: Four times a day (QID) | ORAL | Status: DC | PRN

## 2011-05-22 MED ORDER — FENTANYL CITRATE 0.05 MG/ML IJ SOLN
INTRAMUSCULAR | Status: DC | PRN
  Administered 2011-05-22: 100 ug via INTRAVENOUS
  Administered 2011-05-22: 50 ug via INTRAVENOUS
  Administered 2011-05-22: 100 ug via INTRAVENOUS

## 2011-05-22 MED ORDER — ONDANSETRON HCL 4 MG/2ML IJ SOLN: 4.0000 mg | Freq: Four times a day (QID) | INTRAMUSCULAR | Status: DC | PRN

## 2011-05-22 MED ORDER — SODIUM CHLORIDE 0.9 % IJ SOLN: 9.0000 mL | INTRAMUSCULAR | Status: DC | PRN

## 2011-05-22 MED ORDER — NALOXONE HCL 0.4 MG/ML IJ SOLN: 0.4000 mg | INTRAMUSCULAR | Status: DC | PRN

## 2011-05-22 MED ORDER — FENTANYL CITRATE 0.05 MG/ML IJ SOLN
INTRAMUSCULAR | Status: AC
  Filled 2011-05-22: qty 2

## 2011-05-22 MED ORDER — PROMETHAZINE HCL 25 MG/ML IJ SOLN: 6.2500 mg | INTRAMUSCULAR | Status: DC | PRN

## 2011-05-22 MED ORDER — LIDOCAINE HCL (CARDIAC) 20 MG/ML IV SOLN
INTRAVENOUS | Status: DC | PRN
  Administered 2011-05-22: 80 mg via INTRAVENOUS

## 2011-05-22 MED ORDER — HYDROMORPHONE 0.3 MG/ML IV SOLN
INTRAVENOUS | Status: DC
  Administered 2011-05-22: 17:00:00 via INTRAVENOUS
  Administered 2011-05-22: 3.9 mg via INTRAVENOUS
  Administered 2011-05-23: 1.7 mg via INTRAVENOUS
  Administered 2011-05-23: via INTRAVENOUS
  Administered 2011-05-23: 2.1 mg via INTRAVENOUS
  Administered 2011-05-24: 2.4 mg via INTRAVENOUS
  Administered 2011-05-24: 0.9 mg via INTRAVENOUS

## 2011-05-22 MED ORDER — DEXAMETHASONE SODIUM PHOSPHATE 10 MG/ML IJ SOLN
INTRAMUSCULAR | Status: DC | PRN
  Administered 2011-05-22: 10 mg via INTRAVENOUS

## 2011-05-22 MED ORDER — ACETAMINOPHEN 10 MG/ML IV SOLN
INTRAVENOUS | Status: DC | PRN
  Administered 2011-05-22: 1000 mg via INTRAVENOUS

## 2011-05-22 MED ORDER — ONDANSETRON HCL 4 MG/2ML IJ SOLN
INTRAMUSCULAR | Status: DC | PRN
  Administered 2011-05-22: 4 mg via INTRAVENOUS

## 2011-05-22 MED ORDER — ONDANSETRON HCL 4 MG/2ML IJ SOLN
4.0000 mg | Freq: Four times a day (QID) | INTRAMUSCULAR | Status: DC | PRN
  Administered 2011-05-23: 4 mg via INTRAVENOUS
  Filled 2011-05-22: qty 2

## 2011-05-22 MED ORDER — HETASTARCH-ELECTROLYTES 6 % IV SOLN
INTRAVENOUS | Status: DC | PRN
  Administered 2011-05-22: 15:00:00 via INTRAVENOUS

## 2011-05-22 MED ORDER — SODIUM CHLORIDE 0.9 % IJ SOLN
INTRAMUSCULAR | Status: DC | PRN
  Administered 2011-05-22: 16:00:00 via TOPICAL

## 2011-05-22 MED ORDER — MAGNESIUM HYDROXIDE 400 MG/5ML PO SUSP
30.0000 mL | Freq: Three times a day (TID) | ORAL | Status: AC
  Administered 2011-05-23: 30 mL via ORAL
  Filled 2011-05-22 (×3): qty 30

## 2011-05-22 MED ORDER — CEFOTETAN DISODIUM 2 G IJ SOLR
2.0000 g | INTRAMUSCULAR | Status: AC
  Administered 2011-05-22: 2 g via INTRAVENOUS
  Filled 2011-05-22: qty 2

## 2011-05-22 MED ORDER — LACTATED RINGERS IV SOLN
INTRAVENOUS | Status: DC | PRN
  Administered 2011-05-22: 13:00:00 via INTRAVENOUS

## 2011-05-22 MED ORDER — NEOSTIGMINE METHYLSULFATE 1 MG/ML IJ SOLN
INTRAMUSCULAR | Status: DC | PRN
  Administered 2011-05-22: 5 mg via INTRAVENOUS

## 2011-05-22 MED ORDER — MIDAZOLAM HCL 5 MG/5ML IJ SOLN
INTRAMUSCULAR | Status: DC | PRN
  Administered 2011-05-22: 2 mg via INTRAVENOUS

## 2011-05-22 MED ORDER — SUCCINYLCHOLINE CHLORIDE 20 MG/ML IJ SOLN
INTRAMUSCULAR | Status: DC | PRN
  Administered 2011-05-22: 100 mg via INTRAVENOUS

## 2011-05-22 MED ORDER — GLYCOPYRROLATE 0.2 MG/ML IJ SOLN
INTRAMUSCULAR | Status: DC | PRN
  Administered 2011-05-22: .8 mg via INTRAVENOUS

## 2011-05-22 MED ORDER — PROPOFOL 10 MG/ML IV EMUL
INTRAVENOUS | Status: DC | PRN
  Administered 2011-05-22: 200 mg via INTRAVENOUS

## 2011-05-22 MED ORDER — ZOLPIDEM TARTRATE 5 MG PO TABS: 5.0000 mg | ORAL_TABLET | Freq: Every evening | ORAL | Status: DC | PRN

## 2011-05-22 MED ORDER — ROCURONIUM BROMIDE 100 MG/10ML IV SOLN
INTRAVENOUS | Status: DC | PRN
  Administered 2011-05-22: 50 mg via INTRAVENOUS
  Administered 2011-05-22 (×4): 10 mg via INTRAVENOUS

## 2011-05-22 MED ORDER — DIPHENHYDRAMINE HCL 50 MG/ML IJ SOLN: 12.5000 mg | Freq: Four times a day (QID) | INTRAMUSCULAR | Status: DC | PRN

## 2011-05-22 MED ORDER — ONDANSETRON HCL 4 MG PO TABS
4.0000 mg | ORAL_TABLET | Freq: Four times a day (QID) | ORAL | Status: DC | PRN
  Administered 2011-05-22: 4 mg via ORAL
  Filled 2011-05-22: qty 1

## 2011-05-22 MED ORDER — BUPIVACAINE LIPOSOME 1.3 % IJ SUSP
20.0000 mL | Freq: Once | INTRAMUSCULAR | Status: AC
  Administered 2011-05-22: 20 mL
  Filled 2011-05-22: qty 20

## 2011-05-22 MED ORDER — HYDROMORPHONE HCL 2 MG PO TABS
2.0000 mg | ORAL_TABLET | ORAL | Status: DC | PRN
  Administered 2011-05-24 – 2011-05-26 (×8): 2 mg via ORAL
  Filled 2011-05-22 (×8): qty 1

## 2011-05-22 MED ORDER — LACTATED RINGERS IV SOLN
INTRAVENOUS | Status: DC | PRN
  Administered 2011-05-22 (×2): via INTRAVENOUS

## 2011-05-22 MED ORDER — HEMOSTATIC AGENTS (NO CHARGE) OPTIME
TOPICAL | Status: DC | PRN
  Administered 2011-05-22: 1 via TOPICAL

## 2011-05-22 MED ORDER — ACETAMINOPHEN 10 MG/ML IV SOLN
INTRAVENOUS | Status: AC
  Filled 2011-05-22: qty 100

## 2011-05-22 SURGICAL SUPPLY — 52 items
APL SKNCLS STERI-STRIP NONHPOA (GAUZE/BANDAGES/DRESSINGS) ×2
APPLIER CLIP 11 MED OPEN (CLIP) ×3
APR CLP MED 11 20 MLT OPN (CLIP) ×2
ATTRACTOMAT 16X20 MAGNETIC DRP (DRAPES) ×3 IMPLANT
BAG URINE DRAINAGE (UROLOGICAL SUPPLIES) ×1 IMPLANT
BENZOIN TINCTURE PRP APPL 2/3 (GAUZE/BANDAGES/DRESSINGS) ×3 IMPLANT
BLADE EXTENDED COATED 6.5IN (ELECTRODE) ×3 IMPLANT
CANISTER SUCTION 2500CC (MISCELLANEOUS) ×3 IMPLANT
CHLORAPREP W/TINT 26ML (MISCELLANEOUS) ×4 IMPLANT
CLIP APPLIE 11 MED OPEN (CLIP) ×2 IMPLANT
CLIP TI MEDIUM LARGE 6 (CLIP) ×6 IMPLANT
CLOTH BEACON ORANGE TIMEOUT ST (SAFETY) ×3 IMPLANT
COVER SURGICAL LIGHT HANDLE (MISCELLANEOUS) ×6 IMPLANT
DECANTER SPIKE VIAL GLASS SM (MISCELLANEOUS) ×3 IMPLANT
DRAPE UTILITY 15X26 (DRAPE) ×3 IMPLANT
DRAPE WARM FLUID 44X44 (DRAPE) ×1 IMPLANT
ELECT REM PT RETURN 9FT ADLT (ELECTROSURGICAL) ×3
ELECTRODE REM PT RTRN 9FT ADLT (ELECTROSURGICAL) ×2 IMPLANT
GAUZE SPONGE 4X4 16PLY XRAY LF (GAUZE/BANDAGES/DRESSINGS) ×1 IMPLANT
GLOVE BIO SURGEON STRL SZ7.5 (GLOVE) ×15 IMPLANT
GLOVE BIOGEL M STRL SZ7.5 (GLOVE) ×15 IMPLANT
GLOVE INDICATOR 8.0 STRL GRN (GLOVE) ×3 IMPLANT
GOWN STRL REIN XL XLG (GOWN DISPOSABLE) ×3 IMPLANT
LIGASURE IMPACT 36 18CM CVD LR (INSTRUMENTS) ×3 IMPLANT
NS IRRIG 1000ML POUR BTL (IV SOLUTION) ×6 IMPLANT
PACK ABDOMINAL WL (CUSTOM PROCEDURE TRAY) ×3 IMPLANT
SHEET LAVH (DRAPES) ×3 IMPLANT
SPONGE GAUZE 4X4 STERILE 39 (GAUZE/BANDAGES/DRESSINGS) ×3 IMPLANT
SPONGE LAP 18X18 X RAY DECT (DISPOSABLE) ×2 IMPLANT
STRIP CLOSURE SKIN 1/2X4 (GAUZE/BANDAGES/DRESSINGS) ×3 IMPLANT
SUT ETHILON 1 LR 30 (SUTURE) IMPLANT
SUT PDS AB 0 CT1 36 (SUTURE) ×6 IMPLANT
SUT PDS AB 0 CTX 60 (SUTURE) ×6 IMPLANT
SUT SILK 0 (SUTURE)
SUT SILK 0 30XBRD TIE 6 (SUTURE) ×2 IMPLANT
SUT SILK 2 0 (SUTURE)
SUT SILK 2 0 30  PSL (SUTURE)
SUT SILK 2 0 30 PSL (SUTURE) IMPLANT
SUT SILK 2-0 18XBRD TIE 12 (SUTURE) ×2 IMPLANT
SUT VIC AB 0 CT1 36 (SUTURE) ×22 IMPLANT
SUT VIC AB 2-0 CT2 27 (SUTURE) ×4 IMPLANT
SUT VIC AB 2-0 SH 27 (SUTURE) ×18
SUT VIC AB 2-0 SH 27X BRD (SUTURE) ×8 IMPLANT
SUT VIC AB 3-0 CTX 36 (SUTURE) IMPLANT
SUT VIC AB 4-0 PS1 27 (SUTURE) ×6 IMPLANT
SUT VICRYL 0 TIES 12 18 (SUTURE) ×3 IMPLANT
TAPE CLOTH SURG 4X10 WHT LF (GAUZE/BANDAGES/DRESSINGS) ×2 IMPLANT
TOWEL BLUE STERILE X RAY DET (MISCELLANEOUS) ×3 IMPLANT
TOWEL OR 17X26 10 PK STRL BLUE (TOWEL DISPOSABLE) ×3 IMPLANT
TOWEL OR NON WOVEN STRL DISP B (DISPOSABLE) ×3 IMPLANT
TRAY FOLEY CATH 14FRSI W/METER (CATHETERS) ×3 IMPLANT
WATER STERILE IRR 1500ML POUR (IV SOLUTION) ×4 IMPLANT

## 2011-05-22 NOTE — Anesthesia Postprocedure Evaluation (Signed)
  Anesthesia Post-op Note  Patient: Jackie Cook  Procedure(s) Performed:  EXPLORATORY LAPAROTOMY; HYSTERECTOMY ABDOMINAL - with Resection of Pelvic Mass  ; SALPINGO OOPHERECTOMY  Patient Location: PACU  Anesthesia Type: General  Level of Consciousness: oriented and sedated  Airway and Oxygen Therapy: Patient Spontanous Breathing and Patient connected to nasal cannula oxygen  Post-op Pain: mild  Post-op Assessment: Post-op Vital signs reviewed, Patient's Cardiovascular Status Stable, Respiratory Function Stable and Patent Airway  Post-op Vital Signs: stable  Complications: No apparent anesthesia complications

## 2011-05-22 NOTE — Anesthesia Procedure Notes (Signed)
Procedure Name: Intubation Date/Time: 05/22/2011 2:03 PM Performed by: [place], Revanth Neidig C Pre-anesthesia Checklist: Timeout performed, Patient identified, Emergency Drugs available, Suction available and Patient being monitored Patient Re-evaluated:Patient Re-evaluated prior to inductionOxygen Delivery Method: Circle System Utilized Preoxygenation: Pre-oxygenation with 100% oxygen Intubation Type: IV induction and Inhalational induction Ventilation: Mask ventilation without difficulty Laryngoscope Size: Miller and 2 Grade View: Grade III Tube type: Oral Tube size: 7.5 mm Number of attempts: 2 Airway Equipment and Method: bougie stylet Placement Confirmation: breath sounds checked- equal and bilateral,  positive ETCO2,  CO2 detector and ETT inserted through vocal cords under direct vision Secured at: 21 cm Tube secured with: Tape Dental Injury: Teeth and Oropharynx as per pre-operative assessment  Comments: DL by S [place] with MAC 3, grade 3 view, did not attempt to insert ETT. DL by Dr. Shireen Quan with Hyacinth Meeker 2, boogie and cricoid used 7.5 ETT over boogie.

## 2011-05-22 NOTE — Transfer of Care (Signed)
Immediate Anesthesia Transfer of Care Note  Patient: Jackie Cook  Procedure(s) Performed:  EXPLORATORY LAPAROTOMY; HYSTERECTOMY ABDOMINAL - with Resection of Pelvic Mass  ; SALPINGO OOPHERECTOMY  Patient Location: PACU  Anesthesia Type: General  Level of Consciousness: awake, alert , oriented and patient cooperative  Airway & Oxygen Therapy: Patient Spontanous Breathing and Patient connected to face mask oxygen  Post-op Assessment: Report given to PACU RN, Post -op Vital signs reviewed and stable and Patient moving all extremities  Post vital signs: Reviewed and stable  Complications: No apparent anesthesia complications

## 2011-05-22 NOTE — Op Note (Signed)
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  Nelly Rout, M.D. PhD  Assistant Surgeon: Antionette Char M.D. Assistant: Telford Nab RN, MSN  Anesthesia: Gen. endotracheal  Specimens: Uterus ovaries and tubes  Estimated Blood Loss: 800 cc mL.  Indication for Procedure: This is a 51 year old who presented with a recent history of 30 pound weight loss significant abdominal pain and a pelvic mass. Imaging was notable for an enlarged uterus with multiple areas of necrosis. A 11 cm retroperitoneal mass causing ureteral obstruction was also identified. Bilateral pulmonary nodules were noted. Given the significant abdominal pain the plan was for removal of the uterus with planned adjuvant therapy  Operative Findings: 24 cm uterus extending to bilateral pelvic sidewalls. The cervix is deviated towards the right. A proximately 15 cm retroperitoneal mass was noted extending from the level of the cervix to the bifurcation of the right iliac vessels. Tumor was also noted to be extruding from the right cornua and adherent to the retroperitoneal mass.  Frozen pathology was consistent with uterine spindle cell tumor with atypia and necrosis and mitotic activity Procedure: Patient was taken to the operating room placed under general tracheal anesthesia without any difficulty. She was placed in dorsal lithotomy position and prepped and draped in usual sterile fashion. A midline incision was made etc. from symphysis pubis to approximately 5 cm above the umbilicus. Upon entry into the abdomen a small amount of ascites is noted. This was collected. The incision was further extended more cephalad. A myoma was noted to arise from the superior aspect of the uterus on the left. This was transected in order to facilitate visualization. The left  retroperitoneal space was entered the ureter was identified. Using the LigaSure the left utero-ovarian vessels were sealed. The broad ligament was dissected and was noted to be bullous in nature. Dissection was continued inferiorly to the level of the cervix. Hemostasis was assured with the use of the LigaSure. The vesicouterine reflection was identified and dissected off the upper aspect of the cervix. The right retroperitoneal space was entered. The round ligament was transected. The superior aspect of the ureter was identified and the right utero-ovarian vessels were sealed with the LigaSure and transected. The broad ligament was dissected and a large cyst noted within the broad ligament. This was transected and drained in order to facilitate visualization. The uterine vessels were skeletonized as much as possible and sealed and transected with use of the LigaSure. The uterus was then transected from the cervix using Bovie cautery. Significant bleeding was noted at this point the procedure was at that time that the right-sided retroperitoneal mass was appreciated and noted to be in contiguity with the area of the right corneal with infiltrative disease. The specimen was sent for frozen section evaluation. MR  Hemostasis was obtained. Given the extent of the retroperitoneal disease it was determined that it was not prudent to proceed with removal of the cervix. The cervix was oversewn with interrupted 0 Vicryl sutures. Additional vessels noted within the right side were secured with suture, clips and Bovie cautery.  The area was copiously irrigated and drained. FloSeal was placed over the denuded true peritoneal tumor. Gelfoam was placed over and hemostasis was assured.  Frozen section returned evidence of a uterine malignancy.  The fascia was closed with old looped PDS suture in a mass closure fashion with suture overlapping in the midline. Subcutaneous tissues were copiously  irrigated and drained. The  subcutaneous tissues were infiltrated with Exporil. Hemostasis was assured. The skin was closed with staples.  Sponge, lap and needle counts were correct x 3.    Complications: None  The patient had sequential compression devices for VTE prophylaxis and will  receive Lovenox postoperatively.           Disposition: Patient taken to recovery room in stable condition. Foley draining clear urine         Condition: Stable

## 2011-05-22 NOTE — Anesthesia Preprocedure Evaluation (Addendum)
Anesthesia Evaluation  Patient identified by MRN, date of birth, ID band Patient awake    Reviewed: Allergy & Precautions, H&P , NPO status , Patient's Chart, lab work & pertinent test results, reviewed documented beta blocker date and time   Airway Mallampati: III TM Distance: >3 FB Neck ROM: Full    Dental  (+) Dental Advisory Given   Pulmonary Current Smoker,  clear to auscultation        Cardiovascular neg cardio ROS Regular Normal Denies cardiac symptoms   Neuro/Psych Negative Neurological ROS  Negative Psych ROS   GI/Hepatic negative GI ROS, Neg liver ROS,   Endo/Other  obesity  Renal/GU negative Renal ROS   Pelvic mass    Musculoskeletal negative musculoskeletal ROS (+)   Abdominal   Peds negative pediatric ROS (+)  Hematology negative hematology ROS (+)   Anesthesia Other Findings Poor dentition  Reproductive/Obstetrics negative OB ROS                          Anesthesia Physical Anesthesia Plan  ASA: II  Anesthesia Plan: General   Post-op Pain Management:    Induction: Intravenous  Airway Management Planned: Oral ETT  Additional Equipment:   Intra-op Plan:   Post-operative Plan: Extubation in OR  Informed Consent: I have reviewed the patients History and Physical, chart, labs and discussed the procedure including the risks, benefits and alternatives for the proposed anesthesia with the patient or authorized representative who has indicated his/her understanding and acceptance.     Plan Discussed with: CRNA and Surgeon  Anesthesia Plan Comments:         Anesthesia Quick Evaluation

## 2011-05-22 NOTE — Progress Notes (Signed)
Bowel prep and clear liq diet 05/21/11 as instructed

## 2011-05-22 NOTE — Interval H&P Note (Signed)
History and Physical Interval Note:  05/22/2011 1:19 PM  Jackie Cook  has presented today for surgery, with the diagnosis of Pelvic mass  The various methods of treatment have been discussed with the patient and family. After consideration of risks, benefits and other options for treatment, the patient has consented to  Procedure(s): EXPLORATORY LAPAROTOMY HYSTERECTOMY ABDOMINAL SALPINGO OOPHERECTOMY as a surgical intervention .  The patients' history has been reviewed, patient examined, no change in status, stable for surgery.  I have reviewed the patients' chart and labs.  Questions were answered to the patient's satisfaction.     Laurette Schimke MD

## 2011-05-22 NOTE — H&P (View-Only) (Signed)
Consult Note: Gyn-Onc   [name] 51 y.o. female  Chief Complaint  Patient presents with  . Pelvic Mass    New pt       HPI:  51 year old white female seen in consultation and management of a newly diagnosed abdominal pelvic mass.  The patient has had approximately 3 weeks of abdominal pain and saw her primary care provider who found a large mass on exam. Subsequently she's had a CT scan which shows a 20 x 16 x 16 cm abdominal pelvic mass. This is thought to be a large uterus with a central area of low attenuation consistent with necrosis. There is some evidence of a pedunculated mass measuring 10 x 8.8 x 10.7 cm posterior to the main lesion along the right iliac vessels is a large nodal mass measuring 11 x 7.5 cm which encased the iliac vessels and ureter creating a right hydronephrosis which is severe. The liver spleen and adrenal glands and left kidney are otherwise unremarkable there is no evidence of ascites. The patient denies any past gynecologic history. She reports she continues to have regular cyclic menses. She has not had a Pap smear in several years.  Allergies  Allergen Reactions  . Aspirin     GI upset  . Morphine And Related Itching  . Penicillins Itching    History reviewed. No pertinent past medical history.  Past Surgical History  Procedure Date  . No past surgeries     Current Outpatient Prescriptions  Medication Sig Dispense Refill  . Multiple Vitamins-Minerals (MULTIVITAMIN PO) Take 1 tablet by mouth daily.      Marland Kitchen oxyCODONE-acetaminophen (PERCOCET) 5-325 MG per tablet Take 2 tablets by mouth every 4 (four) hours as needed for pain.  20 tablet  0  . promethazine (PHENERGAN) 25 MG tablet Take 1 tablet (25 mg total) by mouth every 6 (six) hours as needed for nausea.  30 tablet  0    History   Social History  . Marital Status: Single    Spouse Name: N/A    Number of Children: N/A  . Years of Education: N/A   Occupational History  . Not on  file.   Social History Main Topics  . Smoking status: Current Everyday Smoker -- 1.0 packs/day  . Smokeless tobacco: Not on file  . Alcohol Use: No  . Drug Use: No  . Sexually Active: No   Other Topics Concern  . Not on file   Social History Narrative  . No narrative on file    Family History  Problem Relation Age of Onset  . Breast cancer Maternal Aunt   . Heart attack Other     Review of Systems: The patient admits to 50 pound weight loss over the past 6 months. Otherwise her review of systems is negative except as noted above.  Vitals: Blood pressure 128/62, pulse 80, temperature 98.9 F (37.2 C), temperature source Oral, resp. rate 16, height 5\' 5"  (1.651 m), weight 235 lb 6.4 oz (106.777 kg), last menstrual period 04/09/2011.  Physical Exam: In general the patient is a pleasant but anxious white female in no acute distress  HEENT is negative  Neck is supple without thyromegaly.  There is no supraclavicular or inguinal adenopathy.  Abdominal exam reveals a large mass extending from the lower abdomen into the upper abdomen. This is moderately painful without any rebound. I do not detect any ascites.  Ellik exam  EGBUS vagina bladder urethra are normal.  The cervix is deviated  far anteriorly and superiorly and cannot be identified with a speculum nor can I palpate it with my vaginal exam finger. Bimanual and rectovaginal exam revealed no masses deep in the pelvis. There is no cul-de-sac nodularity. 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 The unusual features it is the nodal mass on the right side of the pelvis obstructing the ureter and encasing the blood vessels.  A Pap smear was obtained but I was unable to obtain an endometrial biopsy  .Assessment/Plan:  I recommend that the patient undergo exploratory laparotomy with an attempt to resect the majority of the mass which I believe is the uterus. I did indicate to the  patient that it is unlikely we would be able to resect the right pelvic sidewall mass. Clearly we need to establish a diagnosis before any further therapy can be recommended. She is aware that this is major surgery and carries risks including hemorrhage infection injury to adjacent viscera thromboembolic complications and anesthetic risks. She also understands that she will likely require postoperative therapy. All her questions are answered. She would like to move ahead with surgery as soon as possible and we'll therefore scheduled for February 5. She understands and Dr. Laurette Schimke she will be the primary surgeon.  We will attempt to have a right ureteral stent placed preoperatively.     Jeannette Corpus, MD 05/11/2011, 12:45 PM                         Consult Note: Gyn-Onc   [name] 51 y.o. female  Chief Complaint  Patient presents with  . Pelvic Mass    New pt    Interval History:   HPI:  Allergies  Allergen Reactions  . Aspirin     GI upset  . Morphine And Related Itching  . Penicillins Itching    History reviewed. No pertinent past medical history.  Past Surgical History  Procedure Date  . No past surgeries     Current Outpatient Prescriptions  Medication Sig Dispense Refill  . Multiple Vitamins-Minerals (MULTIVITAMIN PO) Take 1 tablet by mouth daily.      Marland Kitchen oxyCODONE-acetaminophen (PERCOCET) 5-325 MG per tablet Take 2 tablets by mouth every 4 (four) hours as needed for pain.  20 tablet  0  . promethazine (PHENERGAN) 25 MG tablet Take 1 tablet (25 mg total) by mouth every 6 (six) hours as needed for nausea.  30 tablet  0    History   Social History  . Marital Status: Single    Spouse Name: N/A    Number of Children: N/A  . Years of Education: N/A   Occupational History  . Not on file.   Social History Main Topics  . Smoking status: Current Everyday Smoker -- 1.0 packs/day  . Smokeless tobacco: Not on file  .  Alcohol Use: No  . Drug Use: No  . Sexually Active: No   Other Topics Concern  . Not on file   Social History Narrative  . No narrative on file    Family History  Problem Relation Age of Onset  . Breast cancer Maternal Aunt   . Heart attack Other     Review of Systems:  Vitals: Blood pressure 128/62, pulse 80, temperature 98.9 F (37.2 C), temperature source Oral, resp. rate 16, height 5\' 5"  (1.651 m), weight 235 lb 6.4 oz (106.777 kg), last menstrual period 04/09/2011.  Physical Exam:  Assessment/Plan:  Jeannette Corpus, MD 05/11/2011, 12:46 PM

## 2011-05-23 LAB — CBC
MCH: 26.5 pg (ref 26.0–34.0)
MCHC: 34.4 g/dL (ref 30.0–36.0)
MCV: 77.1 fL — ABNORMAL LOW (ref 78.0–100.0)
Platelets: 337 10*3/uL (ref 150–400)
RDW: 15.3 % (ref 11.5–15.5)
WBC: 18.6 10*3/uL — ABNORMAL HIGH (ref 4.0–10.5)

## 2011-05-23 LAB — BASIC METABOLIC PANEL
Calcium: 9.2 mg/dL (ref 8.4–10.5)
Chloride: 103 mEq/L (ref 96–112)
Creatinine, Ser: 1.03 mg/dL (ref 0.50–1.10)
GFR calc Af Amer: 71 mL/min — ABNORMAL LOW (ref >90)
GFR calc non Af Amer: 61 mL/min — ABNORMAL LOW (ref >90)

## 2011-05-23 MED ORDER — HYDROMORPHONE 0.3 MG/ML IV SOLN
INTRAVENOUS | Status: AC
  Administered 2011-05-23: 1.7 mg via INTRAVENOUS
  Filled 2011-05-23: qty 25

## 2011-05-23 MED ORDER — PROMETHAZINE HCL 25 MG/ML IJ SOLN
12.5000 mg | Freq: Four times a day (QID) | INTRAMUSCULAR | Status: DC | PRN
  Administered 2011-05-23: 12.5 mg via INTRAVENOUS
  Filled 2011-05-23: qty 1

## 2011-05-23 MED ORDER — HYDROMORPHONE 0.3 MG/ML IV SOLN
INTRAVENOUS | Status: AC
  Filled 2011-05-23: qty 25

## 2011-05-23 MED ORDER — ENOXAPARIN SODIUM 40 MG/0.4ML ~~LOC~~ SOLN
40.0000 mg | SUBCUTANEOUS | Status: DC
  Administered 2011-05-23 – 2011-05-25 (×3): 40 mg via SUBCUTANEOUS
  Filled 2011-05-23 (×4): qty 0.4

## 2011-05-23 NOTE — Progress Notes (Signed)
MD ordered for pt to be transferred to surgical unit 5 west, report called to [facility]. Pt. left this unit in stable condition.

## 2011-05-23 NOTE — Progress Notes (Signed)
Patient arrived to unit. Transferred from 5east. Is alert and oriented. Dilaudid pca and pulse oximetry active. Abdominal incision clean and dry, staples intact. Is up in chair. No c/o pain at this time.

## 2011-05-23 NOTE — Progress Notes (Signed)
Patient ID: Jackie Cook, female   DOB: 1960-10-05, 51 y.o.   MRN: [id number] 1 Day Post-Op Procedure(s) (LRB): EXPLORATORY LAPAROTOMY (N/Cook) HYSTERECTOMY ABDOMINAL (N/Cook) SALPINGO OOPHERECTOMY (Bilateral)  Subjective: Patient reports incisional pain.    Objective: Vital signs in last 24 hours: Temp:  [97.1 F (36.2 C)-98.8 F (37.1 C)] 98.8 F (37.1 C) (02/06 0533) Pulse Rate:  [65-86] 73  (02/06 0533) Resp:  [12-20] 20  (02/06 0533) BP: (108-132)/(51-74) 113/55 mmHg (02/06 0533) SpO2:  [96 %-100 %] 97 % (02/06 0533) Weight:  [109.2 kg (240 lb 11.9 oz)] 109.2 kg (240 lb 11.9 oz) (02/05 2030) Last BM Date: 05/21/11  Intake/Output from previous day: 02/05 0701 - 02/06 0700 In: 5916.7 [I.V.:5416.7; IV Piggyback:500] Out: 2725 [Urine:1225; Blood:1500]     Physical Examination: General: alert GI: soft, non-tender; bowel sounds normal; no masses,  no organomegaly and incision: bloody drainage present Extremities: extremities normal, atraumatic, no cyanosis or edema   Labs: WBC/Hgb/Hct/Plts:  18.6/8.8/25.6/337 (02/06 0501) BUN/Cr/glu/ALT/AST/amyl/lip:  10/1.03/--/--/--/--/-- (02/06 0501)   Assessment:  51 y.o. s/p Procedure(s)ure(s): EXPLORATORY LAPAROTOMY HYSTERECTOMY ABDOMINAL SALPINGO OOPHERECTOMY: stable Pain:  Pain is well-controlled on PCA .  Heme:Anemia: c/w intraoperative blood loss  GI:  Tolerating po: Yes    Prophylaxis: pharmacologic prophylaxis (with any of the following: Lovenox to start today).  Plan: Advance diet Encourage ambulation Recheck hgb 2/7    LOS: 1 day    Jackie Cook 05/23/2011, 8:39 AM

## 2011-05-24 ENCOUNTER — Other Ambulatory Visit: Payer: Self-pay

## 2011-05-24 ENCOUNTER — Telehealth: Payer: Self-pay | Admitting: Oncology

## 2011-05-24 ENCOUNTER — Inpatient Hospital Stay (HOSPITAL_COMMUNITY): Payer: Medicaid Other

## 2011-05-24 DIAGNOSIS — R000 Tachycardia, unspecified: Secondary | ICD-10-CM | POA: Diagnosis not present

## 2011-05-24 DIAGNOSIS — D62 Acute posthemorrhagic anemia: Secondary | ICD-10-CM | POA: Diagnosis not present

## 2011-05-24 LAB — COMPREHENSIVE METABOLIC PANEL
Albumin: 2.4 g/dL — ABNORMAL LOW (ref 3.5–5.2)
BUN: 9 mg/dL (ref 6–23)
Calcium: 8.5 mg/dL (ref 8.4–10.5)
GFR calc Af Amer: 67 mL/min — ABNORMAL LOW (ref >90)
Glucose, Bld: 114 mg/dL — ABNORMAL HIGH (ref 70–99)
Sodium: 133 mEq/L — ABNORMAL LOW (ref 135–145)
Total Protein: 6.3 g/dL (ref 6.0–8.3)

## 2011-05-24 LAB — CARDIAC PANEL(CRET KIN+CKTOT+MB+TROPI)
CK, MB: 1.9 ng/mL (ref 0.3–4.0)
Total CK: 106 U/L (ref 7–177)
Troponin I: <0.3 ng/mL (ref <0.30)

## 2011-05-24 LAB — PHOSPHORUS: Phosphorus: 3.2 mg/dL (ref 2.3–4.6)

## 2011-05-24 LAB — HEMOGLOBIN AND HEMATOCRIT, BLOOD
HCT: 23.5 % — ABNORMAL LOW (ref 36.0–46.0)
Hemoglobin: 8.1 g/dL — ABNORMAL LOW (ref 12.0–15.0)

## 2011-05-24 MED ORDER — FAMOTIDINE 20 MG PO TABS
20.0000 mg | ORAL_TABLET | Freq: Every day | ORAL | Status: DC | PRN
  Filled 2011-05-24: qty 1

## 2011-05-24 MED ORDER — ACETAMINOPHEN 325 MG PO TABS
650.0000 mg | ORAL_TABLET | Freq: Once | ORAL | Status: AC
  Administered 2011-05-24: 650 mg via ORAL

## 2011-05-24 MED ORDER — FAMOTIDINE 20 MG PO TABS
20.0000 mg | ORAL_TABLET | Freq: Every day | ORAL | Status: DC
  Administered 2011-05-24: 20 mg via ORAL
  Filled 2011-05-24: qty 1

## 2011-05-24 MED ORDER — ACETAMINOPHEN 325 MG PO TABS
ORAL_TABLET | ORAL | Status: AC
  Administered 2011-05-24: 650 mg via ORAL
  Filled 2011-05-24: qty 2

## 2011-05-24 MED ORDER — DIPHENHYDRAMINE HCL 50 MG PO CAPS
50.0000 mg | ORAL_CAPSULE | Freq: Once | ORAL | Status: AC
  Administered 2011-05-24: 50 mg via ORAL
  Filled 2011-05-24: qty 1

## 2011-05-24 MED ORDER — IOHEXOL 350 MG/ML SOLN
100.0000 mL | Freq: Once | INTRAVENOUS | Status: AC | PRN
  Administered 2011-05-24: 100 mL via INTRAVENOUS

## 2011-05-24 MED ORDER — SODIUM CHLORIDE 0.9 % IV BOLUS (SEPSIS)
500.0000 mL | Freq: Once | INTRAVENOUS | Status: AC
  Administered 2011-05-24: 500 mL via INTRAVENOUS

## 2011-05-24 MED ORDER — BISACODYL 10 MG RE SUPP: 10.0000 mg | Freq: Every day | RECTAL | Status: DC | PRN

## 2011-05-24 NOTE — Progress Notes (Signed)
Patient spiked temp of 100.1 with blood transfusion. MD notified. New orders obtained. Meds given. Will continue to monitor the patient.  Also IV infiltrate in R-AC where PRBC's were transfusing. Site d/c'd.

## 2011-05-24 NOTE — Telephone Encounter (Signed)
Talked to Dagmar Hait, she is aware of pt's appt on 06/12/11

## 2011-05-24 NOTE — Progress Notes (Signed)
Patient ID: Jackie Cook, female   DOB: Mar 02, 1961, 51 y.o.   MRN: [id number] 2 Days Post-Op Procedure(s) (LRB): EXPLORATORY LAPAROTOMY (N/A) HYSTERECTOMY ABDOMINAL (N/A) SALPINGO OOPHERECTOMY (Bilateral)  Subjective: Patient reports tolerating PO.    Objective: Vital signs in last 24 hours: Temp:  [98 F (36.7 C)-99.3 F (37.4 C)] 99.3 F (37.4 C) (02/07 0636) Pulse Rate:  [82-144] 144  (02/07 0636) Resp:  [16-20] 18  (02/07 0636) BP: (102-127)/(63-78) 127/67 mmHg (02/07 0636) SpO2:  [94 %-98 %] 94 % (02/07 0636) Weight:  [109.2 kg (240 lb 11.9 oz)] 109.2 kg (240 lb 11.9 oz) (02/06 1115) Last BM Date: 05/22/11  Intake/Output from previous day: 02/06 0701 - 02/07 0700 In: 3441.6 [P.O.:580; I.V.:2861.6] Out: 1255 [Urine:1255]     Physical Examination: General: alert Cardio: tachycardic, regular rhythm GI: soft, non-tender; bowel sounds normal; no masses,  no organomegaly and incision: clean, dry and intact Extremities: Homans sign is negative, no sign of DVT Chest: CTA B/L  Labs: WBC/Hgb/Hct/Plts:  --/8.1/23.5/-- (02/07 0340)     Assessment:  51 y.o. s/p Procedure(s): EXPLORATORY LAPAROTOMY HYSTERECTOMY ABDOMINAL SALPINGO OOPHERECTOMY: anemia Pain:  Pain is well-controlled on PCA.  Heme:Anemia: stable/symptomatic  CV: Arrhythmia: sinus tachycardia in setting of acute blood loss/anemia   GI:  Tolerating po: ?GERD symptoms  Prophylaxis: pharmacologic prophylaxis (with any of the following: LMWH) and intermittent pneumatic compression boots.  Plan: Advance diet Cardiac enzymes, electrolytes, TSH, Spiral Chest CT, transfuse 2 units PRBC, H2 blocker    LOS: 2 days    JACKSON-MOORE,Samaa Ueda A 05/24/2011, 8:19 AM

## 2011-05-25 ENCOUNTER — Telehealth: Payer: Self-pay | Admitting: Oncology

## 2011-05-25 LAB — CARDIAC PANEL(CRET KIN+CKTOT+MB+TROPI)
CK, MB: 1.6 ng/mL (ref 0.3–4.0)
Relative Index: INVALID (ref 0.0–2.5)
Total CK: 71 U/L (ref 7–177)
Troponin I: <0.3 ng/mL (ref <0.30)

## 2011-05-25 LAB — CBC
Platelets: 295 10*3/uL (ref 150–400)
RDW: 15.6 % — ABNORMAL HIGH (ref 11.5–15.5)
WBC: 14.9 10*3/uL — ABNORMAL HIGH (ref 4.0–10.5)

## 2011-05-25 LAB — TYPE AND SCREEN
ABO/RH(D): O POS
Antibody Screen: NEGATIVE
Unit division: 0

## 2011-05-25 NOTE — Telephone Encounter (Signed)
Del.Chart °

## 2011-05-25 NOTE — Progress Notes (Signed)
Patient ID: Jackie Cook, female   DOB: 19-Feb-1961, 51 y.o.   MRN: [id number] 3 Days Post-Op Procedure(s) (LRB): EXPLORATORY LAPAROTOMY (N/A) HYSTERECTOMY ABDOMINAL (N/A) SALPINGO OOPHERECTOMY (Bilateral)  Subjective: Patient reports + flatus.    Objective: Vital signs in last 24 hours: Temp:  [98.1 F (36.7 C)-100.1 F (37.8 C)] 99.3 F (37.4 C) (02/08 0635) Pulse Rate:  [59-100] 88  (02/08 0635) Resp:  [15-20] 18  (02/08 0635) BP: (95-119)/(46-71) 119/71 mmHg (02/08 0635) SpO2:  [94 %-98 %] 96 % (02/08 0635) Last BM Date: 05/22/11  Intake/Output from previous day: 02/07 0701 - 02/08 0700 In: 2421 [P.O.:1171; I.V.:500; Blood:750] Out: 2095 [Urine:2095] Intake/Output this shift: Total I/O In: 240 [P.O.:240] Out: -   Physical Examination: General: alert Cardio: regular rate and rhythm, S1, S2 normal, no murmur, click, rub or gallop GI: soft, non-tender; bowel sounds normal; no masses,  no organomegaly and incision: clean, dry and intact Extremities: extremities normal, atraumatic, no cyanosis or edema  Labs: WBC/Hgb/Hct/Plts:  14.9/8.8/25.0/295 (02/08 0249) BUN/Cr/glu/ALT/AST/amyl/lip:  9/1.08/--/9/13/--/-- (02/07 1037)   Assessment:  51 y.o. s/p Procedure(s): EXPLORATORY LAPAROTOMY HYSTERECTOMY ABDOMINAL SALPINGO OOPHERECTOMY: stable Pain:  Pain is well-controlled on oral medications.  Heme:Anemia: s/p transfusion; doubt on-going bleed; hemodynamically stable   CV: Tachyardia resolved intravascular resuscitation   GI:  Tolerating po: Yes    FEN: Electrolytes normal  Prophylaxis: pharmacologic prophylaxis (with any of the following: LMWH).  Plan: Encourage ambulation repeat H/H on 2/9    LOS: 3 days    JACKSON-MOORE,Rainn Bullinger A 05/25/2011, 9:52 AM

## 2011-05-25 NOTE — Progress Notes (Signed)
*  Late Entry* Patient with elevated HR this AM, HR 140-147, regular rate, pt with no complaints, denies chest pain, pain, or shortness of breath,dizziness, patient reports feeling no changes in condition. BP 127/67, O2 94% on RA, noted some wheezing with assessment. Attempted to contact MD on call answering service for approximately 30 minutes with no response. Contacted Patients MD and notified of above information and Hgb 8.1 this AM. Orders given and initiated. MD to be here shortly to assess patient. Report given to oncoming Day shift RN, to get EKG and give NS bolus. To continue to moniter.

## 2011-05-26 LAB — HEMOGLOBIN AND HEMATOCRIT, BLOOD
HCT: 26.7 % — ABNORMAL LOW (ref 36.0–46.0)
Hemoglobin: 9.1 g/dL — ABNORMAL LOW (ref 12.0–15.0)

## 2011-05-26 MED ORDER — ENOXAPARIN SODIUM 40 MG/0.4ML ~~LOC~~ SOLN: 40.0000 mg | SUBCUTANEOUS | Status: AC

## 2011-05-26 MED ORDER — HYDROCODONE-ACETAMINOPHEN 5-500 MG PO TABS: 1.0000 | ORAL_TABLET | Freq: Four times a day (QID) | ORAL | Status: AC | PRN

## 2011-05-26 NOTE — Progress Notes (Signed)
CARE MANAGEMENT NOTE 05/26/2011  Patient:  Jackie Cook, Jackie Cook   Account Number:  [id number]  Date Initiated:  05/26/2011  Documentation initiated by:  Asya Derryberry  Subjective/Objective Assessment:   51 yo femalke admitted s/p EXPLORATORY LAPAROTOMY  HYSTERECTOMY ABDOMINAL  SALPINGO OOPHERECTOMY . PTA pt lived with spouse.     Action/Plan:   Home when stable   Anticipated DC Date:  05/26/2011   Anticipated DC Plan:  HOME/SELF CARE  In-house referral  NA      DC Planning Services  CM consult  Medication Assistance      PAC Choice  NA   Choice offered to / List presented to:  NA   DME arranged  NA      DME agency  NA     HH arranged  NA      HH agency  NA   Status of service:  Completed, signed off Medicare Important Message given?   (If response is "NO", the following Medicare IM given date fields will be blank) Date Medicare IM given:   Date Additional Medicare IM given:    Discharge Disposition:    Per UR Regulation:    Comments:  05/26/11 1112 Youssouf Shipley,RN,BSN [phone] CM spoke with pt concerning medication assistance. pt self-pay, per MD pt to discharge with home lovenox. pt eligble for indigent funds. Pharmarcy able to fill rx, rx taken to pharmacy by CM. Pt given [facility] generic rug list. Pt agreed to have CM forward demographics to [name] for uninsured program. pt to follow up with Dirk Dress MD on 05/31/11.

## 2011-05-26 NOTE — Discharge Summary (Signed)
Physician Discharge Summary  Patient ID: Jackie Cook MRN: [id number] DOB/AGE: 1961/04/09 51 y.o.  Admit date: 05/22/2011 Discharge date: 05/26/2011  Admission Diagnoses:  Pelvic mass  Discharge Diagnoses: Pelvic mass, anemia secondary to acute blood loss   Discharged Condition: stable  Hospital Course: The patient underwent an exploratory laparotomy/supracervical hysterectomy.  The procedure was complicated by intraoperative bleeding.  Postoperatively the patient became tachycardic--she was transfused 2 units packed red blood cells.  The tachycardia resolved after the intravascular resuscitation.  She was tolerating a regular diet at discharge.  Consults: None  Significant Diagnostic Studies: labs: cardiac enzymes, TSH and radiology: CT scan: chest CT  Treatments: anticoagulation: LMW heparin and surgery: see above  Discharge Exam: Blood pressure 125/71, pulse 90, temperature 98.7 F (37.1 C), temperature source Oral, resp. rate 20, height 5\' 5"  (1.651 m), weight 109.2 kg (240 lb 11.9 oz), last menstrual period 04/09/2011, SpO2 99.00%.  CBC    Component Value Date/Time   WBC 14.9* 05/25/2011 0249   RBC 3.21* 05/25/2011 0249   HGB 9.1* 05/26/2011 0334   HCT 26.7* 05/26/2011 0334   PLT 295 05/25/2011 0249   MCV 77.9* 05/25/2011 0249   MCH 27.4 05/25/2011 0249   MCHC 35.2 05/25/2011 0249   RDW 15.6* 05/25/2011 0249   LYMPHSABS 2.0 05/18/2011 1140   MONOABS 1.1* 05/18/2011 1140   EOSABS 0.4 05/18/2011 1140   BASOSABS 0.1 05/18/2011 1140     General appearance: alert GI: soft, non-tender; bowel sounds normal; no masses,  no organomegaly Extremities: extremities normal, atraumatic, no cyanosis or edema Incision/Wound: C/D/I  Disposition: Home or Self Care  Discharge Orders    Future Appointments: Provider: Department: Dept Phone: Center:   05/31/2011 9:00 AM Laurette Schimke, MD PHD [facility] [phone] None   06/05/2011 12:00 PM [facility] Edu [facility] [phone] None   06/12/2011 9:30 AM [facility] Financial Counselor [facility] [phone] None   06/12/2011 9:45 AM Stephanie Acre [facility] [phone] None   06/12/2011 10:00 AM Lennis Buzzy Han, MD [facility] [phone] None     Future Orders Please Complete By Expires   Diet - low sodium heart healthy      Activity as tolerated - No restrictions      Increase activity slowly      May walk up steps      May shower / Bathe      Comments:   No tub baths for 6 weeks   Driving Restrictions      Comments:   No driving for 1- 2 weeks   Lifting restrictions      Comments:   No lifting > 30 lbs for 6 weeks   Sexual Activity Restrictions      Comments:   No intercourse for 6 - 8 weeks   Discharge wound care:      Comments:   Keep clean and dry   Call MD for:  temperature >100.4      Call MD for:  persistant nausea and vomiting      Call MD for:  severe uncontrolled pain      Call MD for:  redness, tenderness, or signs of infection (pain, swelling, redness, odor or green/yellow discharge around incision site)      Call MD for:  persistant dizziness or light-headedness      Call MD for:  extreme fatigue        Medication List  As of 05/26/2011  9:20 AM   STOP taking these medications  HYDROcodone-acetaminophen 5-325 MG per tablet         TAKE these medications         docusate sodium 100 MG capsule   Commonly known as: COLACE   Take 100 mg by mouth daily.      enoxaparin 40 MG/0.4ML Soln   Commonly known as: LOVENOX   Inject 0.4 mLs (40 mg total) into the skin daily.      ferrous sulfate 325 (65 FE) MG tablet   Take 325 mg by mouth daily.      HYDROcodone-acetaminophen 5-500 MG per tablet   Commonly known as: VICODIN   Take 1 tablet by mouth every 6 (six) hours as needed for pain (Can take up to 2 pills at one time).      MULTIVITAMIN PO   Take 1 tablet by mouth daily.      promethazine 25 MG tablet   Commonly known as: PHENERGAN   Take 25 mg by mouth every 6 (six)  hours as needed.           Follow-up Information    Please follow up. (call Deatra Robinson, RN)          Signed: Roseanna Rainbow 05/26/2011, 9:20 AM

## 2011-05-26 NOTE — Progress Notes (Signed)
Cm spoke with pharmacy concerning lovenox rx. Pt eligible for indigent funds. Rx taken to pharmacy by Cm.   Leonie Green [phone]

## 2011-05-31 ENCOUNTER — Encounter: Payer: Self-pay | Admitting: Gynecologic Oncology

## 2011-05-31 ENCOUNTER — Ambulatory Visit (HOSPITAL_COMMUNITY)
Admission: RE | Admit: 2011-05-31 | Discharge: 2011-05-31 | Disposition: A | Discharge status: Home / Self Care | Payer: Self-pay | Source: Ambulatory Visit | Attending: Gynecologic Oncology | Admitting: Gynecologic Oncology

## 2011-05-31 ENCOUNTER — Ambulatory Visit: Payer: Medicaid Other | Attending: Gynecologic Oncology | Admitting: Gynecologic Oncology

## 2011-05-31 VITALS — BP 160/70 | HR 70 | Temp 97.8°F | Resp 18 | Ht 65.83 in | Wt 232.1 lb

## 2011-05-31 DIAGNOSIS — Z90710 Acquired absence of both cervix and uterus: Secondary | ICD-10-CM | POA: Insufficient documentation

## 2011-05-31 DIAGNOSIS — Z9079 Acquired absence of other genital organ(s): Secondary | ICD-10-CM | POA: Insufficient documentation

## 2011-05-31 DIAGNOSIS — C549 Malignant neoplasm of corpus uteri, unspecified: Secondary | ICD-10-CM | POA: Insufficient documentation

## 2011-05-31 DIAGNOSIS — M7989 Other specified soft tissue disorders: Secondary | ICD-10-CM | POA: Insufficient documentation

## 2011-05-31 DIAGNOSIS — C499 Malignant neoplasm of connective and soft tissue, unspecified: Secondary | ICD-10-CM

## 2011-05-31 DIAGNOSIS — C55 Malignant neoplasm of uterus, part unspecified: Secondary | ICD-10-CM | POA: Insufficient documentation

## 2011-05-31 NOTE — Patient Instructions (Signed)
  Stage IV uterine leiomyosarcoma Immediate instituation of chemotherapy,  Docetaxol and gemcitabine for  symptom management. If there is a response to chemotherapy and residual disease remains in the retroperitoneal area consideration could be given for adjuvant radiotherapy. Prescription for Percocet  Patient's been asked to followup in 3 weeks. She's been advised to keep all appointments with Dr. Ozella Rocks

## 2011-05-31 NOTE — Progress Notes (Signed)
VASCULAR LAB PRELIMINARY  PRELIMINARY  PRELIMINARY  PRELIMINARY  Right lower extremity venous duplex completed.    Preliminary report:  Right:  No evidence of DVT, superficial thrombosis, or Baker's cyst.   Terance Hart, RVT 05/31/2011, 10:17 AM

## 2011-05-31 NOTE — Progress Notes (Signed)
Consult Note: Gyn-Onc   Jackie Cook 51 y.o. female  Chief Complaint  Patient presents with  . Leiomyosarcoma    Follow up     HPI:  51 year old white female presented with an abdominal pelvic mass. Subsequently she's had a CT scan which shows a 20 x 16 x 16 cm abdominal pelvic mass. This is thought to be a large uterus with a central area of low attenuation consistent with necrosis. There is some evidence of a pedunculated mass measuring 10 x 8.8 x 10.7 cm posterior to the main lesion along the right iliac vessels is a large nodal mass measuring 11 x 7.5 cm which encased the iliac vessels and ureter creating a right hydronephrosis which is severe. The liver spleen and adrenal glands and left kidney are otherwise unremarkable there is no evidence of ascites. The patient denies any past gynecologic history. Chest x-ray was notable for 2 metastatic lesions.   On February 5 she underwent an exploratory laparotomy.  Operative findings were notable for a 24 cm uterus extending to bilateral pelvic sidewalls. The cervix was deviated towards the right. A proximately 15 cm retroperitoneal mass was noted extending from the level of the cervix to the bifurcation of the right iliac vessels. Tumor was also noted to be extruding from the right cornua and adherent to the retroperitoneal mass.  She underwent supracervical hysterectomy  bilateral salpingo-oophorectomy. 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 - TUMOR INVOLVES UTERUS AND RIGHT ADNEXA - LYMPHOVASCULAR INVASION IS IDENTIFIED. - SEE ONCOLOGY TABLE BELOW. Microscopic Comment UTERUS Specimen: Uterus and bilateral adnexa. Procedure: Supracervical hysterectomy and bilateral salpingo-oophorectomy. Lymph node sampling performed: No. Specimen integrity: Intact. Maximum tumor size (gross measurement): At least 17.0 cm. Histologic type:  Leiomyosarcoma. Grade: High grade. Myometrial invasion: N/A. Cervical stromal involvement: Not identified. Extent of involvement of other organs: Involves the right adnexa. Lymph vascular invasion: Present, scattered foci. Peritoneal washings: N/A. Lymph nodes: Number examined 0; number positive N/A. TNM code: pT2a, pNX, at least. FIGO Stage (based on pathologic findings, needs clinical correlation): IIA, at least. Comments: There are multiple foci of leiomyosarcoma; the largest grossly spans 17.0 cm and is present in the myometrium. In addition to this largest nodule, there are two other grossly identified sarcomas in the right para adnexa tissue, spanning 6.0 and 4.5 cm. The myometrium also contains benign leiomyomata and the endometrium contains two benign endometrioid polyps.  Allergies  Allergen Reactions  . Aspirin     GI upset  . Morphine And Related Itching  . Penicillins Itching    Past Medical History  Diagnosis Date  . Anemia   . Blood transfusion     2001  . H/O hiatal hernia     Past Surgical History  Procedure Date  . No past surgeries     Current Outpatient Prescriptions  Medication Sig Dispense Refill  . docusate sodium (COLACE) 100 MG capsule Take 100 mg by mouth daily.      Marland Kitchen enoxaparin (LOVENOX) 40 MG/0.4ML SOLN Inject 0.4 mLs (40 mg total) into the skin daily.  11.2 mL  1  . ferrous sulfate 325 (65 FE) MG tablet Take 325 mg by mouth daily.      Marland Kitchen HYDROcodone-acetaminophen (VICODIN) 5-500 MG per tablet Take 1 tablet by mouth every 6 (six) hours as needed for pain (Can take up to 2 pills at one time).  60 tablet  0  . Multiple Vitamins-Minerals (MULTIVITAMIN PO) Take  1 tablet by mouth daily.      . promethazine (PHENERGAN) 25 MG tablet Take 25 mg by mouth every 6 (six) hours as needed.        History   Social History  . Marital Status: Single    Spouse Name: N/A    Number of Children: N/A  . Years of Education: N/A   Occupational History  . Not  on file.   Social History Main Topics  . Smoking status: Current Everyday Smoker -- 0.5 packs/day for 34 years  . Smokeless tobacco: Never Used  . Alcohol Use: No  . Drug Use: No  . Sexually Active: No   Other Topics Concern  . Not on file   Social History Narrative  . No narrative on file    Family History  Problem Relation Age of Onset  . Breast cancer Maternal Aunt   . Heart attack Other     Review of Systems: Reports epigastric pain anorexia nausea no vomiting. Reports significant right back pain radiating to low right leg that is not managed well on current Vicodin. Reports edema of the right upper thigh since surgery.   Denies vaginal bleeding, no rectal bleeding.    Vitals: Blood pressure 160/70, pulse 70, temperature 97.8 F (36.6 C), temperature source Oral, resp. rate 18, height 5' 5.83" (1.672 m), weight 232 lb 1.6 oz (105.28 kg), last menstrual period 04/09/2011.  Physical Exam: A pleasant but anxious white female in significant distress with multiple episodes of crying. HEENT is negative  There is no supraclavicular or inguinal adenopathy.  Abdomen;  soft nontender.  Midline incision healing well without any evidence of wound separation or discharge  Pelvic EGBUS vagina bladder urethra are normal.  Probable cervix without any pelvic masses  Extremities: Edema of the right upper thigh from the knee to the inguinal area at the lower extremities within normal limits. .Assessment/Plan: Stage IV uterine leiomyosarcoma. Patient has significant residual disease in the right retroperitoneal area with obstruction of the ureter. There is also noticeable edema of the right upper thigh. A Doppler ultrasound was obtained and was unremarkable. It's likely that this swelling is she used to venous stasis.    The poor prognosis was discussed with the patient and her family. Recommendation was given for immediate in the situation of chemotherapy, to Docetaxol and gemcitabine for  emission symptom management. If there is a response to chemotherapy and residual disease remains in the retroperitoneal area consideration could be given for adjuvant radiotherapy. Prescription for Percocet was given. Patient's been asked to followup in 3 weeks. She's been advised to keep all appointments with Dr. Juel Burrow as necessary.  Laurette Schimke, MD 05/31/2011, 11:28 AM                         Consult Note: Gyn-Onc   Jackie Cook 51 y.o. female  Chief Complaint  Patient presents with  . Leiomyosarcoma    Follow up    Interval History:   HPI:  Allergies  Allergen Reactions  . Aspirin     GI upset  . Morphine And Related Itching  . Penicillins Itching    Past Medical History  Diagnosis Date  . Anemia   . Blood transfusion     2001  . H/O hiatal hernia     Past Surgical History  Procedure Date  . No past surgeries     Current Outpatient Prescriptions  Medication Sig Dispense Refill  . docusate  sodium (COLACE) 100 MG capsule Take 100 mg by mouth daily.      Marland Kitchen enoxaparin (LOVENOX) 40 MG/0.4ML SOLN Inject 0.4 mLs (40 mg total) into the skin daily.  11.2 mL  1  . ferrous sulfate 325 (65 FE) MG tablet Take 325 mg by mouth daily.      Marland Kitchen HYDROcodone-acetaminophen (VICODIN) 5-500 MG per tablet Take 1 tablet by mouth every 6 (six) hours as needed for pain (Can take up to 2 pills at one time).  60 tablet  0  . Multiple Vitamins-Minerals (MULTIVITAMIN PO) Take 1 tablet by mouth daily.      . promethazine (PHENERGAN) 25 MG tablet Take 25 mg by mouth every 6 (six) hours as needed.        History   Social History  . Marital Status: Single    Spouse Name: N/A    Number of Children: N/A  . Years of Education: N/A   Occupational History  . Not on file.   Social History Main Topics  . Smoking status: Current Everyday Smoker -- 0.5 packs/day for 34 years  . Smokeless tobacco: Never Used  . Alcohol Use: No  . Drug Use: No  . Sexually  Active: No   Other Topics Concern  . Not on file   Social History Narrative  . No narrative on file    Family History  Problem Relation Age of Onset  . Breast cancer Maternal Aunt   . Heart attack Other     Review of Systems:  Vitals: Blood pressure 160/70, pulse 70, temperature 97.8 F (36.6 C), temperature source Oral, resp. rate 18, height 5' 5.83" (1.672 m), weight 232 lb 1.6 oz (105.28 kg), last menstrual period 04/09/2011.  Physical Exam:  Assessment/Plan:   Laurette Schimke, MD 05/31/2011, 11:28 AM

## 2011-06-05 ENCOUNTER — Encounter: Payer: Self-pay | Admitting: *Deleted

## 2011-06-05 ENCOUNTER — Other Ambulatory Visit: Payer: Self-pay

## 2011-06-05 ENCOUNTER — Encounter: Payer: Self-pay | Admitting: Oncology

## 2011-06-05 NOTE — Progress Notes (Signed)
Patient approve 100% Discount  06/05/11 - 12/03/11.

## 2011-06-11 ENCOUNTER — Telehealth: Payer: Self-pay | Admitting: Gynecologic Oncology

## 2011-06-11 NOTE — Telephone Encounter (Signed)
Called to speak with patient about information needed by [facility] for lovenox assistance.  Company requesting income documentation or a federal tax return but pt reporting no income and no federal tax information.  Patient reporting right leg swelling, numbness, tightness, and pain in the thigh region.  Denies any areas of redness or drainage.  Doppler performed at last visit was negative for DVT (swelling present at this time).  Telford Nab RN, MSN notified of the right leg symptoms- no new orders or recommendations at this time.  Pt has appointment with Dr Darrold Span in the am 06/12/11.  Plan to contact [employer] for further recommendations to complete the application.

## 2011-06-11 NOTE — Telephone Encounter (Signed)
Spoke with a representative at [employer] about the form for lovenox assistance.  Company stating that patient needs to contact them about financial status/income.  Called to speak with Pilar Grammes but unavailable.  Message left with family member with the company's number and instructions to contact them to further the application process.  All information required from the GYN Oncology office has been submitted.

## 2011-06-12 ENCOUNTER — Ambulatory Visit (HOSPITAL_BASED_OUTPATIENT_CLINIC_OR_DEPARTMENT_OTHER): Payer: Self-pay | Admitting: Oncology

## 2011-06-12 ENCOUNTER — Telehealth: Payer: Self-pay | Admitting: Oncology

## 2011-06-12 ENCOUNTER — Other Ambulatory Visit: Payer: Self-pay | Admitting: Lab

## 2011-06-12 ENCOUNTER — Other Ambulatory Visit: Payer: Self-pay | Admitting: Oncology

## 2011-06-12 ENCOUNTER — Inpatient Hospital Stay (HOSPITAL_COMMUNITY)
Admission: EM | Admit: 2011-06-12 | Discharge: 2011-06-19 | DRG: 660 | Disposition: A | Discharge status: Home / Self Care | Payer: Medicaid Other | Attending: Internal Medicine | Admitting: Internal Medicine

## 2011-06-12 ENCOUNTER — Encounter (HOSPITAL_COMMUNITY): Payer: Self-pay | Admitting: *Deleted

## 2011-06-12 ENCOUNTER — Emergency Department (HOSPITAL_COMMUNITY): Payer: Medicaid Other

## 2011-06-12 ENCOUNTER — Encounter (HOSPITAL_COMMUNITY): Payer: Self-pay | Admitting: Internal Medicine

## 2011-06-12 ENCOUNTER — Ambulatory Visit: Payer: Self-pay

## 2011-06-12 ENCOUNTER — Other Ambulatory Visit: Payer: Self-pay

## 2011-06-12 ENCOUNTER — Encounter: Payer: Self-pay | Admitting: Oncology

## 2011-06-12 ENCOUNTER — Ambulatory Visit: Payer: Self-pay | Admitting: Nutrition

## 2011-06-12 VITALS — BP 143/74 | HR 83 | Temp 98.0°F | Ht 66.0 in | Wt 238.7 lb

## 2011-06-12 DIAGNOSIS — Z6838 Body mass index (BMI) 38.0-38.9, adult: Secondary | ICD-10-CM

## 2011-06-12 DIAGNOSIS — N1330 Unspecified hydronephrosis: Secondary | ICD-10-CM

## 2011-06-12 DIAGNOSIS — M5137 Other intervertebral disc degeneration, lumbosacral region: Secondary | ICD-10-CM | POA: Diagnosis present

## 2011-06-12 DIAGNOSIS — R600 Localized edema: Secondary | ICD-10-CM | POA: Diagnosis present

## 2011-06-12 DIAGNOSIS — D630 Anemia in neoplastic disease: Secondary | ICD-10-CM | POA: Diagnosis present

## 2011-06-12 DIAGNOSIS — C55 Malignant neoplasm of uterus, part unspecified: Secondary | ICD-10-CM

## 2011-06-12 DIAGNOSIS — M51379 Other intervertebral disc degeneration, lumbosacral region without mention of lumbar back pain or lower extremity pain: Secondary | ICD-10-CM | POA: Diagnosis present

## 2011-06-12 DIAGNOSIS — R110 Nausea: Secondary | ICD-10-CM | POA: Diagnosis present

## 2011-06-12 DIAGNOSIS — R599 Enlarged lymph nodes, unspecified: Secondary | ICD-10-CM | POA: Diagnosis present

## 2011-06-12 DIAGNOSIS — R609 Edema, unspecified: Secondary | ICD-10-CM | POA: Diagnosis present

## 2011-06-12 DIAGNOSIS — C7800 Secondary malignant neoplasm of unspecified lung: Secondary | ICD-10-CM | POA: Diagnosis present

## 2011-06-12 DIAGNOSIS — D62 Acute posthemorrhagic anemia: Secondary | ICD-10-CM

## 2011-06-12 DIAGNOSIS — E669 Obesity, unspecified: Secondary | ICD-10-CM

## 2011-06-12 DIAGNOSIS — D649 Anemia, unspecified: Secondary | ICD-10-CM

## 2011-06-12 DIAGNOSIS — N19 Unspecified kidney failure: Secondary | ICD-10-CM

## 2011-06-12 DIAGNOSIS — K5900 Constipation, unspecified: Secondary | ICD-10-CM | POA: Diagnosis present

## 2011-06-12 DIAGNOSIS — N179 Acute kidney failure, unspecified: Principal | ICD-10-CM

## 2011-06-12 DIAGNOSIS — N135 Crossing vessel and stricture of ureter without hydronephrosis: Secondary | ICD-10-CM | POA: Diagnosis present

## 2011-06-12 HISTORY — DX: Reserved for concepts with insufficient information to code with codable children: IMO0002

## 2011-06-12 HISTORY — DX: Personal history of other diseases of the female genital tract: Z87.42

## 2011-06-12 LAB — URINALYSIS, MICROSCOPIC - CHCC
Glucose: NEGATIVE g/dL
Leukocyte Esterase: NEGATIVE
Nitrite: NEGATIVE
Specific Gravity, Urine: 1.005 (ref 1.003–1.035)

## 2011-06-12 LAB — COMPREHENSIVE METABOLIC PANEL
Albumin: 3.4 g/dL — ABNORMAL LOW (ref 3.5–5.2)
CO2: 24 mEq/L (ref 19–32)
Glucose, Bld: 109 mg/dL — ABNORMAL HIGH (ref 70–99)
Sodium: 144 mEq/L (ref 135–145)
Total Bilirubin: 0.4 mg/dL (ref 0.3–1.2)
Total Protein: 6.6 g/dL (ref 6.0–8.3)

## 2011-06-12 LAB — CBC WITH DIFFERENTIAL/PLATELET
Basophils Absolute: 0.1 10*3/uL (ref 0.0–0.1)
Eosinophils Absolute: 0.4 10*3/uL (ref 0.0–0.5)
HCT: 26.5 % — ABNORMAL LOW (ref 34.8–46.6)
HGB: 9 g/dL — ABNORMAL LOW (ref 11.6–15.9)
LYMPH%: 11.4 % — ABNORMAL LOW (ref 14.0–49.7)
MONO#: 1 10*3/uL — ABNORMAL HIGH (ref 0.1–0.9)
NEUT#: 8.1 10*3/uL — ABNORMAL HIGH (ref 1.5–6.5)
Platelets: 365 10*3/uL (ref 145–400)
RBC: 3.25 10*6/uL — ABNORMAL LOW (ref 3.70–5.45)
WBC: 10.8 10*3/uL — ABNORMAL HIGH (ref 3.9–10.3)

## 2011-06-12 MED ORDER — DOCUSATE SODIUM 100 MG PO CAPS
200.0000 mg | ORAL_CAPSULE | Freq: Every day | ORAL | Status: DC
  Administered 2011-06-13 – 2011-06-19 (×8): 200 mg via ORAL
  Filled 2011-06-12 (×9): qty 2

## 2011-06-12 MED ORDER — ONDANSETRON 8 MG PO TBDP: 8.0000 mg | ORAL_TABLET | Freq: Once | ORAL | Status: DC

## 2011-06-12 MED ORDER — ONDANSETRON HCL 8 MG PO TABS: ORAL_TABLET | ORAL | Status: DC

## 2011-06-12 MED ORDER — ONDANSETRON HCL 4 MG/2ML IJ SOLN
4.0000 mg | Freq: Four times a day (QID) | INTRAMUSCULAR | Status: DC | PRN
  Administered 2011-06-16 – 2011-06-18 (×2): 4 mg via INTRAVENOUS
  Filled 2011-06-12 (×3): qty 2

## 2011-06-12 MED ORDER — OXYCODONE-ACETAMINOPHEN 5-325 MG PO TABS
1.0000 | ORAL_TABLET | ORAL | Status: DC | PRN
  Administered 2011-06-12 – 2011-06-17 (×7): 1 via ORAL
  Filled 2011-06-12 (×8): qty 1

## 2011-06-12 MED ORDER — SODIUM CHLORIDE 0.9 % IV SOLN
INTRAVENOUS | Status: DC
  Administered 2011-06-12 – 2011-06-13 (×2): via INTRAVENOUS

## 2011-06-12 MED ORDER — ONDANSETRON HCL 4 MG PO TABS: 4.0000 mg | ORAL_TABLET | Freq: Four times a day (QID) | ORAL | Status: DC | PRN

## 2011-06-12 MED ORDER — DEXAMETHASONE 4 MG PO TABS: ORAL_TABLET | ORAL | Status: DC

## 2011-06-12 MED ORDER — ACETAMINOPHEN 325 MG PO TABS: 650.0000 mg | ORAL_TABLET | Freq: Four times a day (QID) | ORAL | Status: DC | PRN

## 2011-06-12 MED ORDER — ENOXAPARIN SODIUM 30 MG/0.3ML ~~LOC~~ SOLN
30.0000 mg | SUBCUTANEOUS | Status: DC
  Filled 2011-06-12: qty 0.3

## 2011-06-12 MED ORDER — LIDOCAINE-PRILOCAINE 2.5-2.5 % EX CREA: TOPICAL_CREAM | CUTANEOUS | Status: DC | PRN

## 2011-06-12 MED ORDER — ACETAMINOPHEN 650 MG RE SUPP: 650.0000 mg | Freq: Four times a day (QID) | RECTAL | Status: DC | PRN

## 2011-06-12 MED ORDER — ADULT MULTIVITAMIN W/MINERALS CH
1.0000 | ORAL_TABLET | Freq: Every day | ORAL | Status: DC
  Administered 2011-06-13 – 2011-06-19 (×8): 1 via ORAL
  Filled 2011-06-12 (×9): qty 1

## 2011-06-12 MED ORDER — POLYETHYLENE GLYCOL 3350 17 GM/SCOOP PO POWD: 17.0000 g | Freq: Every day | ORAL | Status: DC

## 2011-06-12 MED ORDER — POLYETHYLENE GLYCOL 3350 17 GM/SCOOP PO POWD
17.0000 g | Freq: Every day | ORAL | Status: DC
  Administered 2011-06-14: 17 g via ORAL
  Filled 2011-06-12 (×2): qty 255

## 2011-06-12 MED ORDER — SODIUM CHLORIDE 0.9 % IV SOLN
Freq: Once | INTRAVENOUS | Status: AC
  Administered 2011-06-12: 19:00:00 via INTRAVENOUS

## 2011-06-12 MED ORDER — LOPERAMIDE HCL 2 MG PO CAPS: 4.0000 mg | ORAL_CAPSULE | Freq: Once | ORAL | Status: DC

## 2011-06-12 NOTE — Patient Instructions (Signed)
Take milk of mag (or miralax/glycolax) every day, to keep bowels moving daily   We will call in prescriptions to [facility], where you can use patient assistance funds:  Zofran (ondansetron) 8 mg       1 - 2 every 12 hrs as needed for nausea. Does not make you drowsy Decadron (dexamethasone, steroid) 4 mg      2 tablets = 8 mg with food morning and evening x 3 days begin day before taxotere chemotherapy.         Your first taxotere will be March 18, so you will need to take the steroid March 17,18 and 19.  Each dose with food. EMLA/ELLAMAX numbing cream to put on the portacath before access. Use a good squirt and cover with a small piece of saran wrap to keep it on the port and off of your clothes.    Your phenergan will be fine to use for chemo nausea also, as needed every 6 hrs.   You need to skip one dose of your lovenox before the portacath is put in   Dr.Mark Ottelin will see you at [facility] this Thurs. 2-28  Arrive 1230 for 1:00 appointment.  [phone].  He will talk with you about putting in a stent to drain the right kidney.   Call any time if questions or concerns    [phone]

## 2011-06-12 NOTE — ED Provider Notes (Signed)
History     CSN: [id number]  Arrival date & time 06/12/11  1640   First MD Initiated Contact with Patient 06/12/11 1717      Chief Complaint  Patient presents with  . Acute Renal Failure    cancer pt    (Consider location/radiation/quality/duration/timing/severity/associated sxs/prior treatment) HPI Comments: Patient with a history of leiomyosarcoma, of the uterus involving her right adnexal organs possibly her right ureter and vascular to her pelvis, status post total abdominal hysterectomy at the beginning of February reports that she was having a routine evaluation with the oncologist Dr. Darrold Span who checked a urinalysis and some blood tests and discover that she was in "renal failure" and was told to come to the emergency department supposedly for admission. The patient denies a decrease in her urine output. She reports no prior history of kidney problems, however she does report that do to the area of her cancer, it did involve her ureter. She reports some appropriate discomfort on the right side of her abdomen and postsurgical. She denies fever or chills. She denies nausea vomiting. She reports her appointment today was in preparation for future chemotherapy as some of her cancer was not able to be fully removed surgically due to the involvement of her blood vessels.  Otherwise she has been in her usual state of health.  The history is provided by the patient and a relative.    Past Medical History  Diagnosis Date  . Anemia   . Blood transfusion     2001  . H/O hiatal hernia   . Malignant neoplasm of body of uterus 05/31/2011    Past Surgical History  Procedure Date  . No past surgeries   . Abdominal hysterectomy     Family History  Problem Relation Age of Onset  . Breast cancer Maternal Aunt   . Heart attack Other     History  Substance Use Topics  . Smoking status: Former Smoker -- 0.5 packs/day for 34 years    Quit date: 05/22/2011  . Smokeless tobacco: Never  Used  . Alcohol Use: No    OB History    Grav Para Term Preterm Abortions TAB SAB Ect Mult Living                  Review of Systems  Constitutional: Negative for fever, chills and fatigue.  Gastrointestinal: Positive for abdominal pain. Negative for nausea, vomiting and diarrhea.  Genitourinary: Negative for dysuria and decreased urine volume.  Skin: Negative for color change.  Neurological: Negative for weakness.  All other systems reviewed and are negative.    Allergies  Aspirin; Morphine and related; and Penicillins  Home Medications   Current Outpatient Rx  Name Route Sig Dispense Refill  . DOCUSATE SODIUM 100 MG PO CAPS Oral Take 200 mg by mouth daily.     Marland Kitchen ENOXAPARIN SODIUM 40 MG/0.4ML [place] SOLN Subcutaneous Inject 0.4 mLs (40 mg total) into the skin daily. 11.2 mL 1  . MULTIVITAMIN PO Oral Take 1 tablet by mouth daily.    . OXYCODONE-ACETAMINOPHEN 5-325 MG PO TABS Oral Take 1 tablet by mouth every 4 (four) hours as needed. For pain.    Marland Kitchen POLYETHYLENE GLYCOL 3350 PO POWD Oral Take 17 g by mouth daily. 255 g 1    TO KEEP BOWELS MOVING DAILY  . PROMETHAZINE HCL 25 MG PO TABS Oral Take 25 mg by mouth every 6 (six) hours as needed. For nausea.      BP 150/69  Pulse 82  Temp(Src) 98.9 F (37.2 C) (Oral)  Resp 16  Wt 236 lb (107.049 kg)  SpO2 100%  LMP 04/09/2011  Physical Exam  Nursing note and vitals reviewed. Constitutional: She is oriented to person, place, and time. She appears well-developed and well-nourished. No distress.  HENT:  Head: Normocephalic and atraumatic.  Eyes: No scleral icterus.  Cardiovascular: Normal rate.   Pulmonary/Chest: Effort normal. No respiratory distress. She has no wheezes.  Abdominal: Soft. Bowel sounds are normal. She exhibits mass. She exhibits no distension. There is tenderness. There is no rebound and no guarding.  Musculoskeletal: She exhibits no tenderness.       Legs: Neurological: She is alert and oriented to person,  place, and time.  Skin: Skin is warm and dry. No rash noted. No erythema.  Psychiatric: She has a normal mood and affect. Her speech is normal and behavior is normal. Judgment and thought content normal. Cognition and memory are normal.    ED Course  Procedures (including critical care time)  Labs Reviewed - No data to display No results found.   1. Renal failure   2. Leiomyosarcoma of uterus     RA sat is 100% and normal.    MDM   I reviewed the patient's prior records. The patient did have right hydronephrosis secondary to the cancer. She did have a CBC as well as a complete metabolic panel performed at the oncology office this morning. Labs were drawn at 09 30. Her creatinine at that time was 3.76. Plan is to place a Foley catheter and performing a ultrasound of her kidneys. Patient will require admission. She will likely require urology consultation, possibly for stent placement in my opinion. Otherwise she is nontoxic-appearing with stable vital signs at present.   6:35 PM I spoke to Dr. Darnelle Catalan who will see pt in the ED and agrees to admit for renal failure.         Gavin Pound. Oletta Lamas, MD 06/12/11 [phone]

## 2011-06-12 NOTE — ED Notes (Signed)
Pt states "did a urine test for Dr. Darrold Span, she called me and told me to come on in now because they may do the stent in my right kidney tonight or tomorrow"

## 2011-06-12 NOTE — Telephone Encounter (Signed)
Chemistries sent at new patient visit earlier today resulted with creatinine 3.76/ BUN 30, these having been 1.08/ 9 on 05-24-2011. Called [facility] as I had made outpatient referral to Dr.Ottelin for 06-14-11 prior to this result; LM for on call MD to call me back. Called patient at home # and told her that creatinine much higher, particularly concerning with known right hydronephrosis by previous scan and present nausea/ probably inadequate po fluid intake. I recommended that she go to [facility] ED, anticipating that she will need admission and urologic intervention prior to 06-14-11. Patient agrees and will go to ED.

## 2011-06-12 NOTE — Progress Notes (Signed)
[facility] NEW PATIENT EVALUATION Date of Visit 06-12-2011  Name: Jackie Cook Date: 06/12/2011 MRN: [id number] DOB: 1960/04/30  REFERRING PHYSICIAN: Dr.Wendy Nelly Rout Other physicians: none. No regular medical care in years.  Referral made to Dr. Ihor Gully for new patient visit 06-14-2011.  REASON FOR REFERRAL: metastatic leiomyosarcoma of uterus    HISTORY OF PRESENT ILLNESS:Jackie Cook is a 51 y.o. female who is see in new patient consultation, together with her mother, on referral from Dr.Wendy [facility] for recent diagnosis of metastatic leiomyosarcoma of uterus. History is of heavy menstrual bleeding x years, 4 months of continuous light vaginal bleeding fall 2012, and more recent constipation. She had also lost 50 lbs in past 8 months, which she states was intentional with diet changes. She had significant constipation in Jan 2013, followed by several days of pelvic pain after bowels finally moved. She presented to ED because of the pain, with CT AP 05-07-2011 in [facility] with 20 x 16.7 x 16  cm pelvic mass thought to be uterus, with second 10.3x10.7x8.8 cm mass adjacent, an 11 x 7.5 cm nodal mass encasing right iliac vessels and severe right hydronephrosis. She was referred to gyn oncology, seen by [facility] 05-11-11, then went to exploratory laparotomy with supracervical hysterectomy at [facility] by Dr.Wendy Nelly Rout on 05-22-11. At surgery she had a 24 cm uterus extending to bilateral pelvic sidewalls with an ~ 15 cm retroperitoneal mass. Pathology [id number]) showed multifocal leiomyosarcoma spanning 17 cm, involving uterus and right adnexa, + LVSI. Postoperatively she was tachycardic which improved with 2 units PRBCs; she also had CT angio chest done during that hospitalization 05-24-11 which was negative for PE but did show multiple bilateral pulmonary nodules radiographically consistent with metastases. She was seen in follow up by Dr.Brewster at clinic  on 05-31-2011, with increased swelling of right thigh and right back pain; venous doppler was negative for clot RLE. Patient continues on lovenox daily since surgery, ~ 9 days left. Dr.Brewster has recommended taxotere/ gemcitabine and patient has attended chemotherapy teaching class prior to my visit today. My RN has also reviewed teaching for these drugs today. Patient has had no problems with healing of the surgical wound, but has right flank/ right low back pain (?new vs related to known disc disease), more swelling RLE which is heavy and some discomfort in thigh, increased nausea with poor po intake, some suprapubic discomfort and still constipation. She has been using just occasional percocet (total 3 yesterday), MOM about every 3 days, and phenergan. She has had water, tea, fruit punch and a couple of donuts only in last 36 hrs. She is not vomiting and denies GERD symptoms. She has some urinary pressure and we have sent UA/C&S now. She has had no fever. She had slight bleeding from lower surgical incision x 1, no other bleeding. She denies SOB, cough, chest pain and stopped smoking with surgery. Last BM was 06-10-11. She is sleeping poorly.   REVIEW OF SYSTEMS: otherwise, no HA or other neuro symptoms, good visual acuity with glasses, denies dental pain or bleeding, has not had any dental care in quite some time. Denies difficulty being off cigarettes, not using patch or gum. Long hx low back pain at least to 2001, intermittently radiates down right leg and some lateral thigh numbness, no numbness hands or feet. Has been on oral iron since 2001 (when hgb 5.3 such that planned back surgery was cancelled, also had IV iron then). IV access apparently was not easy  during hospitalization.  ALLERGIES: Aspirin; Morphine and related; and Penicillins Morphine and PCN caused itching, ASA causes GI upset.  PAST MEDICAL HISTORY:  No other surgery. Iron deficiency anemia dating back at least to 2001 (see above).  Disc problems low back. No pregnancies.   CURRENT MEDICATIONS: phenergan, percocet, MOM, ibuprofen  SOCIAL HISTORY: From [place], where she lives with friend. Mother in [place]. 1 ppd cigarettes since age 89 until DC'd 05-22-11. Denies ETOH. Transfused PRBCs 2001 and at recent surgery. Not employed.  FAMILY HISTORY: Mother healthy. Father deceased with "brain and lung" cancer, also alcoholic. Sister healthy. Maternal aunt breast ca. Maternal uncle with renal disease and DM, maternal grandparents with MIs.  LABORATORY DATA:  Results for orders placed in visit on 06/12/11 (from the past 48 hour(s))  CBC WITH DIFFERENTIAL     Status: Abnormal   Collection Time   06/12/11  9:30 AM      Component Value Range Comment   WBC 10.8 (*) 3.9 - 10.3 (10e3/uL)    NEUT# 8.1 (*) 1.5 - 6.5 (10e3/uL)    HGB 9.0 (*) 11.6 - 15.9 (g/dL)    HCT 40.9 (*) 81.1 - 46.6 (%)    Platelets 365  145 - 400 (10e3/uL)    MCV 81.6  79.5 - 101.0 (fL)    MCH 27.6  25.1 - 34.0 (pg)    MCHC 33.8  31.5 - 36.0 (g/dL)    RBC 9.14 (*) 7.82 - 5.45 (10e6/uL)    RDW 16.0 (*) 11.2 - 14.5 (%)    lymph# 1.2  0.9 - 3.3 (10e3/uL)    MONO# 1.0 (*) 0.1 - 0.9 (10e3/uL)    Eosinophils Absolute 0.4  0.0 - 0.5 (10e3/uL)    Basophils Absolute 0.1  0.0 - 0.1 (10e3/uL)    NEUT% 74.5  38.4 - 76.8 (%)    LYMPH% 11.4 (*) 14.0 - 49.7 (%)    MONO% 9.6  0.0 - 14.0 (%)    EOS% 3.9  0.0 - 7.0 (%)    BASO% 0.6  0.0 - 2.0 (%)   COMPREHENSIVE METABOLIC PANEL     Status: Abnormal   Collection Time   06/12/11  9:30 AM      Component Value Range Comment   Sodium 144  135 - 145 (mEq/L)    Potassium 4.3  3.5 - 5.3 (mEq/L)    Chloride 106  96 - 112 (mEq/L)    CO2 24  19 - 32 (mEq/L)    Glucose, Bld 109 (*) 70 - 99 (mg/dL)    BUN 30 (*) 6 - 23 (mg/dL)    Creatinine, Ser 9.56 (*) 0.50 - 1.10 (mg/dL)    Total Bilirubin 0.4  0.3 - 1.2 (mg/dL)    Alkaline Phosphatase 64  39 - 117 (U/L)    AST 11  0 - 37 (U/L)    ALT <8  0 - 35 (U/L)    Total  Protein 6.6  6.0 - 8.3 (g/dL)    Albumin 3.4 (*) 3.5 - 5.2 (g/dL)    Calcium 9.5  8.4 - 10.5 (mg/dL)      Last prior chemistries from 05-24-2011 BUN 9, creat 1.08 and from 05-07-11 BUN 8 and creat 0.98  UA, C&S sent and pending today   RADIOGRAPHY: CT A{ 05-07-11 and CT angio chest 05-24-11 as noted above, both in [facility]      PHYSICAL EXAM:  height is 5\' 6"  (1.676 m) and weight is 238 lb 11.2 oz (108.274 kg).  Her oral temperature is 98 F (36.7 C). Her blood pressure is 143/74 and her pulse is 83.  Slowly ambulatory, looks mildly uncomfortable and nonspecifically unwell. Fully alert and appropriate, good historian. Mother supportive.She is drinking water during visit. HEENT: normal hair pattern. PERRL, not icteric. Mouth moist, very poor dentition with multiple broken and missing teeth. Neck supple without JVD or obvious thyroid mass, no cervical or supraclavicular adenopathy. Lungs with somewhat diminished breath sounds bilaterally, no wheezes, rales, crackles or rubs and clear to percussion, no use of accessory muscles. Breasts bilaterally without dominant masses, skin or nipple changes. Cor RRR without gallop. Abdomen full, quiet, not clearly tender. Midline incision still with a few areas of eschar but mostly closed, no drainage or bleeding, no significant surrounding tenderness. Lower panus minimal erythema without heat. Cannot appreciate inguinal adenopathy. 2+ swelling RLE and trace edema LLE without clear cords. Feet warm. No focal neuro deficits. Skin without rash or much bruising at lovenox injection sites. IV access does not appear adequate for chemo.  Discussion and planning: I have discussed all of above at length with patient and her mother. They understand that most likely the pulmonary nodules are metastatic leiomyosarcoma, and certainly there is extensive metastatic involvement in pelvis. I have recommended that we follow the pulmonary nodules as we treat with systemic  chemotherapy, as the clinical response may give enough information that she would not have to have a separate biopsy of these. She understands that we cannot cure leiomyosarcoma that is this extensive, tho some of these malignancies do shrink or stabilize for a time on taxotere/gemzar or other systemic therapies. She is particularly uncomfortable from the RLE swelling; I have asked her to keep the leg elevated as much as possible. We have discussed RT to right pelvic involvement if chemo does not help adequately. She can certainly continue prn pain medication. Lymphedema PT and Teds probably would not improve swelling much until area of obstruction has improved.   I have confirmed with [facility] financial staff that she has been approved for 100% assistance. She is glad to use [facility] so that patient assistance funds can cover zofran, phenergan, miralax, decadron and EMLA that will be needed for chemotherapy.  We will set up PAC by IR.  I would like her to be seen by urology,and that office has kindly set her up as new patient to Dr.Ottelin for 06-14-11. Note that office requires $250 up front payment as she has no insurance, and I have discussed with [facility] financial staff using part of patient assistance funds for this if needed.  [facility] dietician has met with her now and given samples of supplement drinks as well as beginning teaching etc.   With need for PAC and urologic assistance, I have set up chemotherapy to begin with gemzar 3-11 and gemzar/taxotere 3-12 (day1, day 8 q 21 days). We will check cbc by fingerstick with each chemo and for now will also check BMET from [facility] also with each chemo administration. She will also need neulasta with taxotere. I will see her again at least 3-18 and again 3-27, cbc/cmet with my visit 3-27.  Patient and her mother seemed to follow the discussion well and were in agreement with plan as above. She knows that she can call at any time if questions or  concerns. Mother plans to bring her to appointments, prefers days other than Fridays.   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 2.right ureteral obstruction with hydronephrosis seen on scan in Jan: chemistries were pending at time of visit (see addendum to this note) 3.Inadequate peripheral IV access for chemo: PAC by IR 4.long tobacco recently DC'd. She tells me that she does not plan to resume smoking at all. 5.no insurance 6.obesity: 50 lb weight loss in past ~ 8 months, reportedly intentional 7.lumbar disc disease 8.very poor dentition: I mentioned [facility] but did not try to do this also today 9.blood loss anemia by history 10.Nausea and constipation: I have asked her to use MOM or miralax (from [facility]) daily.   Genie Wenke P, MD 06/12/2011 4:20 PM   ADDENDUM: creatinine resulted after visit at 3.76. I contacted patient by phone and have asked her to go to [facility] ED. She may be somewhat dehydrated, but she may also have progressed to bilateral hydronephrosis and likely will need admission for urologic intervention as soon as possible. I did speak to urologist on call to make them aware.  Jama Flavors, MD

## 2011-06-12 NOTE — H&P (Signed)
Hospital Admission Note Date: 06/12/2011  Patient name: Jackie Cook Medical record number: [id number] Date of birth: 08/16/1960 Age: 51 y.o. Gender: female PCP: No primary provider on file. (Patient confirms no PCP) Oncologist: Dr. Jama Flavors  Attending physician: Hillery Aldo, MD Emergency Contact: Stanford Scotland (friend) [phone] Code Status: Full code  Chief Complaint: Told to come to ED by Dr. Darrold Span  History of Present Illness: Jackie Cook is an 51 y.o. female with recent diagnosis of metastatic leiomyosarcoma of uterus who presented to the ER upon the advice of Dr. Darrold Span, who she saw for the first time today, after routine blood work revealed significant renal impairment.  The patient denies any specific symptoms, other than back pain.  The patient was originally diagnosed with uterine cancer 05/07/11, when she presented to the ED with complaints of back and pelvic pain, with subsequent CT scans showing 20 x 16.7 x 16 cm pelvic mass thought to be uterus, with a second 10.3x10.7x8.8 cm mass adjacent, an 11 x 7.5 cm nodal mass encasing right iliac vessels and severe right hydronephrosis.  She underwent an exploratory laparotomy with supracervical hysterectomy by Dr.Wendy Nelly Rout on 05-22-11. CT scans of the chest showed metastatic involvement of the lungs.  She had gone to Dr. Precious Reel office for oncologic follow up for planned chemotherapy.  Routine labs were drawn by Dr. Precious Reel office, which showed that the patient's creatinine went from 1.08 on 05/24/11 to 3.76 today.  She subsequently was advised to come to the ER.  A renal ultrasound was done but results are pending at this time.    Past Medical History Past Medical History  Diagnosis Date  . Anemia     Iron deficiency  . Blood transfusion     2001  . H/O hiatal hernia   . 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 H/O menorrhagia   . DDD (degenerative disc  disease)     Past Surgical History Past Surgical History  Procedure Date  . Total abdominal hysterectomy w/ bilateral salpingoophorectomy 05/22/11    Meds: Prior to Admission medications   Medication Sig Start Date End Date Taking? Authorizing Provider  docusate sodium (COLACE) 100 MG capsule Take 200 mg by mouth daily.    Yes Historical Provider, MD  enoxaparin (LOVENOX) 40 MG/0.4ML SOLN Inject 0.4 mLs (40 mg total) into the skin daily. 05/26/11 06/12/11 Yes Roseanna Rainbow, MD  Multiple Vitamins-Minerals (MULTIVITAMIN PO) Take 1 tablet by mouth daily.   Yes Historical Provider, MD  oxyCODONE-acetaminophen (PERCOCET) 5-325 MG per tablet Take 1 tablet by mouth every 4 (four) hours as needed. For pain.   Yes Historical Provider, MD  polyethylene glycol powder (MIRALAX) powder Take 17 g by mouth daily. 06/12/11  Yes Lennis Buzzy Han, MD  promethazine (PHENERGAN) 25 MG tablet Take 25 mg by mouth every 6 (six) hours as needed. For nausea.   Yes Historical Provider, MD    Allergies: Aspirin; Morphine and related; and Penicillins  Social History: History   Social History  . Marital Status: Single    Spouse Name: N/A    Number of Children: 0  . Years of Education: 12   Occupational History  . Unemployed   . Has done dog sitting in the past.    Social History Main Topics  . Smoking status: Former Smoker -- 1.0 packs/day for 34 years    Quit date: 05/22/2011  . Smokeless tobacco: Never Used  . Alcohol  Use: No  . Drug Use: No  . Sexually Active: No   Other Topics Concern  . Not on file   Social History Narrative   Single.  Lives with a friend.  Independent of ADLs and ambulation.    Family History:  Family History  Problem Relation Age of Onset  . Breast cancer Maternal Aunt   . Heart attack Other   . Lung cancer Father   .       Review of Systems: Constitutional: No fever or chills;  Appetite diminished; + weight loss of 50 lbs in last 8 months.  HEENT: No blurry  vision or diplopia, no pharyngitis or dysphagia; wears glasses, poor dentition. CV: No chest pain or arrhythmia.  Resp: No SOB, no cough. GI: Occasional nausea, no vomiting, no diarrhea, no melena or hematochezia, occasional constipation.  GU: No dysuria or hematuria.  MSK: + back pain x several months, initially improved after hysterectomy but started back recently, +RLE swelling   Neuro:  No headache or focal neurological deficits but has had some RLE numbness associated with swelling.  Psych: No depression or anxiety, + poor sleep.  Endo: No thyroid disease or DM.  Skin: No rashes or lesions.  Heme: +anemia, h/o heavy menses, no blood dyscrasia   Physical Exam: Blood pressure 150/69, pulse 82, temperature 98.9 F (37.2 C), temperature source Oral, resp. rate 16, height 5\' 6"  (1.676 m), weight 107.049 kg (236 lb), last menstrual period 04/09/2011, SpO2 100.00%. BP 150/69  Pulse 82  Temp(Src) 98.9 F (37.2 C) (Oral)  Resp 16  Ht 5\' 6"  (1.676 m)  Wt 107.049 kg (236 lb)  BMI 38.09 kg/m2  SpO2 100%  LMP 04/09/2011  General Appearance:    Alert, cooperative, no distress, appears stated age  Head:    Normocephalic, without obvious abnormality, atraumatic  Eyes:    PERRL, conjunctiva/corneas clear, EOM's intact  Ears:    Normal external ear canals, both ears  Nose:   Nares normal, septum midline, mucosa normal, no drainage    or sinus tenderness  Throat:   Lips, mucosa, and tongue normal; multiple broken teeth with extensive decay/caries.  Neck:   Supple, symmetrical, trachea midline, no adenopathy;    thyroid:  no enlargement/tenderness/nodules; no carotid   bruit or JVD  Back:     Symmetric, no curvature, ROM normal, no CVA tenderness  Lungs:     Clear to auscultation bilaterally, respirations unlabored  Chest Wall:    No tenderness or deformity   Heart:    Regular rate and rhythm, S1 and S2 normal, no murmur, rub   or gallop  Abdomen:     Soft, non-tender, bowel sounds active all four  quadrants,    no masses, no organomegaly.  Healing midline scar.  Extremities:   RLE asymmetric edema from foot to thigh  Pulses:   2+ and symmetric all extremities  Skin:   Skin color, texture, turgor normal, no rashes or lesions  Lymph nodes:   Cervical, supraclavicular, and axillary nodes normal  Neurologic:   Non-focal   Lab results: Basic Metabolic Panel:  Lab 06/12/11 1610  NA 144  K 4.3  CL 106  CO2 24  GLUCOSE 109*  BUN 30*  CREATININE 3.76*  CALCIUM 9.5  MG --  PHOS --   GFR Estimated Creatinine Clearance: 21.7 ml/min (by C-G formula based on Cr of 3.76). Liver Function Tests:  Lab 06/12/11 0930  AST 11  ALT <8  ALKPHOS 64  BILITOT 0.4  PROT 6.6  ALBUMIN 3.4*    CBC:  Lab 06/12/11 0930  WBC 10.8*  NEUTROABS 8.1*  HGB 9.0*  HCT 26.5*  MCV 81.6  PLT 365    Imaging results:  No results found.  Assessment & Plan: Principal Problem:  *ARF (acute renal failure) Likely secondary to obstructive uropathy in the setting of her history of uterine cancer with bulky pelvic disease. I have spoken with the urologist on call who plans to see her briefly tonight, but she will need a nephrostomy tube placed. A renal ultrasound has been done but the results are not yet back. We'll gently hydrate. Active Problems:  Leiomyosarcoma of uterus Patient is status post total abdominal hysterectomy but her disease has metastasized to the lungs and treatment options are not curative. Will let Dr. Darrold Span know of the patient's admission in the morning.  Hydronephrosis, right Have spoken with Dr. Retta Diones with plans to see the patient either this evening or tomorrow for placement of a percutaneous nephrostomy tube.  Obesity (BMI 30-39.9) Patient has had significant weight loss but remains obese with a BMI of 38.1. We'll request a dietitian evaluation.  Constipation We will continue the patient on daily MiraLAX and Colace.  Nausea We'll give anti-emetics when necessary.   Right leg edema Likely from compressive lymphadenopathy. She apparently had a Doppler exam done which ruled out DVT within the past month.  Prophylaxis: Patient will be maintained on Lovenox, which will be adjusted for her renal function.  Time Spent On Admission: One hour  Jackie Cook 06/12/2011, 7:15 PM Pager [phone]

## 2011-06-12 NOTE — Assessment & Plan Note (Signed)
Jackie Cook is a 51 year old female patient of Dr. Darrold Span diagnosed with stage IV uterine leiomyosarcoma.    History includes anemia and hiatal hernia.  MEDICATIONS INCLUDE:  Colace, ferrous sulfate, multivitamin and Phenergan. Decadron, Zofran, and miralax added today.  LABS INCLUDE:  Sodium of 133, glucose of 114, albumin of 2.4 on February 7.  HEIGHT:  66 inches. WEIGHT:  238.7 pounds. USUAL BODY WEIGHT:  240 pounds. BMI:  38.55.  I received a call from Dr. Darrold Span requesting nutrition supplements for patient as the patient was having difficulty eating.  I had a few minutes to stop by and talk to the patient today.  She reports severe nausea, but no vomiting.  She has not been taking her Phenergan on a regular basis.  Her weight is stable overall.  She complains of constipation.  NUTRITION DIAGNOSIS:  Food and nutrition related knowledge deficit related to new diagnosis of uterine cancer and associated treatments as evidenced by no prior need for nutrition related information.  INTERVENTION:  I have educated the patient on the importance of consuming small, frequent high protein meals and snacks daily.  I educated her on strategies for eating with nausea as well as constipation.  I have encouraged her to discuss her symptoms with her physician so that medications could be adjusted as needed.  I have explained the importance of taking nausea medication as written to help prevent nausea.  I have encouraged the patient to continue to drink a lot of water and have provided nutritional supplement samples for her to try.  I provided fact sheets for the patient and my contact information.  MONITORING/EVALUATION (GOALS):  The patient will tolerate increased oral intake to minimize weight loss and to improve symptoms during treatment.  NEXT VISIT:  There is no followup scheduled for patient at this time. She will call me if she has questions or  concerns.    ______________________________ Zenovia Jarred, RD, LDN Clinical Nutrition Specialist BN/MEDQ  D:  06/12/2011  T:  06/12/2011  Job:  [id number]

## 2011-06-12 NOTE — Consult Note (Addendum)
Urology Consult  CC: Increasing creatinine with hydronephrosis  HPI: 51 year old year old female with new onset acute renal insufficiency. She has metastatic leiomyosarcoma of the uterus, and is status post resection of a huge leiomyosarcoma of the uterus, 20 cm in maximum diameter. That was performed on 05/22/2011. She did have metastatic disease in nodes, more on the right than the left. Preoperatively, she had right hydronephrosis. The patient saw Dr. Darrold Span today, and was found to have a creatinine of approximately 3.7. Baseline creatinine was close to 1. She has not had any significant flank pain. She has not had gross hematuria. Because of her hydronephrosis, an elevating creatinine, urologic consultation is requested.  PMH: Past Medical History  Diagnosis Date  . Anemia     Iron deficiency  . Blood transfusion     2001  . H/O hiatal hernia   . 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 H/O menorrhagia   . DDD (degenerative disc disease)     PSH: Past Surgical History  Procedure Date  . Total abdominal hysterectomy w/ bilateral salpingoophorectomy 05/22/11    Allergies: Allergies  Allergen Reactions  . Aspirin     GI upset  . Morphine And Related Itching  . Penicillins Itching    Medications:  (Not in a hospital admission)   Social History: History   Social History  . Marital Status: Single    Spouse Name: N/A    Number of Children: 0  . Years of Education: 12   Occupational History  . Unemployed   . Has done dog sitting in the past.    Social History Main Topics  . Smoking status: Former Smoker -- 1.0 packs/day for 34 years    Quit date: 05/22/2011  . Smokeless tobacco: Never Used  . Alcohol Use: No  . Drug Use: No  . Sexually Active: No   Other Topics Concern  . Not on file   Social History Narrative   Single.  Lives with a friend.  Independent of ADLs and ambulation.    Family History: Family  History  Problem Relation Age of Onset  . Breast cancer Maternal Aunt   . Heart attack Other   . Lung cancer Father   .       Review of Systems: Positive: She has had some back pain which is improved since her hysterectomy. She has had significant weight loss over the past several months. She's had no blood in her school or urine. Negative:   A further 10 point review of systems was negative except what is listed in the HPI.  Physical Exam: @VITALS2 @ General: No acute distress.  Awake. Head:  Normocephalic.  Atraumatic. ENT:  EOMI.  Mucous membranes moist Skin:  Normal turgor.  No visible rash. Extremity: No gross deformity of bilateral upper extremities.  No gross deformity of    bilateral lower extremities. Neurologic: Alert. Appropriate mood.    Studies:  Recent Labs  Basename 06/12/11 0930   HGB 9.0*   WBC 10.8*   PLT 365    Recent Labs  Basename 06/12/11 0930   NA 144   K 4.3   CL 106   CO2 24   BUN 30*   CREATININE 3.76*   CALCIUM 9.5   GFRNONAA --   GFRAA --     No results found for this basename: PT:2,INR:2,APTT:2 in the last 72 hours   No components found with this basename: ABG:2  I  reviewed the patient's renal ultrasound from today. She does have mild left hydronephrosis and moderate right hydronephrosis.  Assessment: Bilateral hydronephrosis from malignant neoplasm.  Plan:  I have discussed management with the patient. Because of her malignant hydronephrosis, her nodal disease, and her increasing creatinine, I have recommended that her cutaneous nephrostomy tubes in place. I fear that if stents are placed, they would soon be blocked by her bulky disease. She understands placement of the percutaneous nephrostomy tubes. Hopefully, we will be able to get this performed in the near future.  Down the road, if she has a remarkable response to any adjuvant therapy, instead of changing the tubes and a regular basis, she could have nephrostograms to check  the patency of her ureters, and the percutaneous tube could be removed at a later date.  Because of the need to place nephrostomy tubes, I have held the patient's Lovenox for now. Pager:[phone]

## 2011-06-13 ENCOUNTER — Other Ambulatory Visit: Payer: Self-pay | Admitting: Oncology

## 2011-06-13 ENCOUNTER — Inpatient Hospital Stay (HOSPITAL_COMMUNITY): Payer: Medicaid Other

## 2011-06-13 ENCOUNTER — Encounter: Payer: Self-pay | Admitting: Oncology

## 2011-06-13 ENCOUNTER — Other Ambulatory Visit: Payer: Self-pay

## 2011-06-13 ENCOUNTER — Other Ambulatory Visit: Payer: Self-pay | Admitting: Radiology

## 2011-06-13 DIAGNOSIS — D638 Anemia in other chronic diseases classified elsewhere: Secondary | ICD-10-CM

## 2011-06-13 DIAGNOSIS — N179 Acute kidney failure, unspecified: Principal | ICD-10-CM

## 2011-06-13 DIAGNOSIS — C55 Malignant neoplasm of uterus, part unspecified: Secondary | ICD-10-CM

## 2011-06-13 DIAGNOSIS — D649 Anemia, unspecified: Secondary | ICD-10-CM

## 2011-06-13 DIAGNOSIS — K5900 Constipation, unspecified: Secondary | ICD-10-CM

## 2011-06-13 HISTORY — PX: OTHER SURGICAL HISTORY: SHX169

## 2011-06-13 LAB — IRON AND TIBC
Iron: 28 ug/dL — ABNORMAL LOW (ref 42–135)
Saturation Ratios: 14 % — ABNORMAL LOW (ref 20–55)
TIBC: 205 ug/dL — ABNORMAL LOW (ref 250–470)

## 2011-06-13 LAB — BASIC METABOLIC PANEL
GFR calc Af Amer: 13 mL/min — ABNORMAL LOW (ref >90)
GFR calc non Af Amer: 11 mL/min — ABNORMAL LOW (ref >90)
Glucose, Bld: 96 mg/dL (ref 70–99)
Potassium: 4.8 mEq/L (ref 3.5–5.1)
Sodium: 139 mEq/L (ref 135–145)

## 2011-06-13 LAB — APTT: aPTT: 39 seconds — ABNORMAL HIGH (ref 24–37)

## 2011-06-13 LAB — FERRITIN: Ferritin: 506 ng/mL — ABNORMAL HIGH (ref 10–291)

## 2011-06-13 LAB — CBC
Hemoglobin: 7.7 g/dL — ABNORMAL LOW (ref 12.0–15.0)
MCHC: 33.3 g/dL (ref 30.0–36.0)
RDW: 15.8 % — ABNORMAL HIGH (ref 11.5–15.5)

## 2011-06-13 LAB — URINE CULTURE

## 2011-06-13 MED ORDER — CIPROFLOXACIN IN D5W 400 MG/200ML IV SOLN
400.0000 mg | INTRAVENOUS | Status: AC
  Administered 2011-06-13: 400 mg via INTRAVENOUS
  Filled 2011-06-13: qty 200

## 2011-06-13 MED ORDER — PROMETHAZINE HCL 25 MG PO TABS: 25.0000 mg | ORAL_TABLET | Freq: Four times a day (QID) | ORAL | Status: DC | PRN

## 2011-06-13 MED ORDER — NICOTINE 21 MG/24HR TD PT24
21.0000 mg | MEDICATED_PATCH | Freq: Every day | TRANSDERMAL | Status: DC | PRN
  Filled 2011-06-13: qty 1

## 2011-06-13 MED ORDER — DIPHENHYDRAMINE HCL 50 MG/ML IJ SOLN: 25.0000 mg | Freq: Four times a day (QID) | INTRAMUSCULAR | Status: DC | PRN

## 2011-06-13 MED ORDER — GLYCERIN (LAXATIVE) 2.1 G RE SUPP
1.0000 | Freq: Every day | RECTAL | Status: DC | PRN
  Filled 2011-06-13: qty 1

## 2011-06-13 MED ORDER — ACETAMINOPHEN 325 MG PO TABS: 325.0000 mg | ORAL_TABLET | Freq: Four times a day (QID) | ORAL | Status: DC | PRN

## 2011-06-13 MED ORDER — IOHEXOL 300 MG/ML  SOLN: 20.0000 mL | Freq: Once | INTRAMUSCULAR | Status: AC | PRN

## 2011-06-13 MED ORDER — ZOLPIDEM TARTRATE 5 MG PO TABS
5.0000 mg | ORAL_TABLET | Freq: Every evening | ORAL | Status: DC | PRN
  Administered 2011-06-13 – 2011-06-15 (×3): 5 mg via ORAL
  Filled 2011-06-13 (×3): qty 1

## 2011-06-13 MED ORDER — FENTANYL CITRATE 0.05 MG/ML IJ SOLN
INTRAMUSCULAR | Status: AC | PRN
  Administered 2011-06-13 (×2): 100 ug via INTRAVENOUS

## 2011-06-13 MED ORDER — SODIUM CHLORIDE 0.9 % IV SOLN: INTRAVENOUS | Status: DC

## 2011-06-13 MED ORDER — HYDROMORPHONE HCL PF 1 MG/ML IJ SOLN
1.0000 mg | INTRAMUSCULAR | Status: DC | PRN
  Administered 2011-06-13 – 2011-06-17 (×17): 1 mg via INTRAVENOUS
  Filled 2011-06-13 (×17): qty 1

## 2011-06-13 MED ORDER — MIDAZOLAM HCL 5 MG/5ML IJ SOLN
INTRAMUSCULAR | Status: AC | PRN
  Administered 2011-06-13: 2 mg via INTRAVENOUS

## 2011-06-13 NOTE — Progress Notes (Signed)
  Subjective: The patient is doing well.  No nausea or vomiting. Pain is adequately controlled. She tolerated bilateral nephrostomies well.  Objective: Vital signs in last 24 hours: Temp:  [98.2 F (36.8 C)-99.2 F (37.3 C)] 98.2 F (36.8 C) (02/27 1404) Pulse Rate:  [75-91] 75  (02/27 1659) Resp:  [11-22] 12  (02/27 1659) BP: (117-157)/(68-85) 141/68 mmHg (02/27 1659) SpO2:  [97 %-100 %] 100 % (02/27 1659) Weight:  [105.28 kg (232 lb 1.6 oz)] 105.28 kg (232 lb 1.6 oz) (02/26 2105)  Intake/Output from previous day:   Intake/Output this shift: Total I/O In: -  Out: 600 [Urine:600]  Physical Exam:  Urine pink  Lab Results:  Basename 06/13/11 0505 06/12/11 0930  HGB 7.7* 9.0*  HCT 23.1* 26.5*    Assessment/Plan: S/p nephrostomy tube placement for malignant hydronephrosis. She is doing well.  I do NOT AGREE with internalization of stents--she has bulky disease and will most likely need frequent stent replacement. Leave nephrostomy tubes for now.  I will be gone tomorrow but will see her Friday   Bertram Millard. Labrittany Wechter, MD  06/13/2011, 6:07 PM

## 2011-06-13 NOTE — Progress Notes (Signed)
06/13/2011, 8:21 AM  Hospital day: 2 Antibiotics: none Chemotherapy: not yet begun   Appreciate assistance from Dr.Rama and Dr.Dahlstedt with admission last pm and plan for bilateral percutaneous nephrostomy tubes by IR as work in today.  I had met her as new patient at [facility] 06-12-2011, with nonoliguric renal failure apparent when chemistries resulted from that consultation visit.  Subjective: Slept little last pm and mild HA now, is NPO for procedure today. Last lovenox was 06-11-11 @ 1800; I have written SCDs until lovenox is resumed. She has been voiding thru night, total 600 cc urine charted. No bowel movement since 06-10-11, did have miralax and colace since admission. No increased shortness of breath. No noted bleeding. Appetite seems a little better (but NPO). Discussed PCNs., she understands and agrees. She fell in ED bathroom "right knee went out", hit face on sink without significant injury.  Objective: Vital signs in last 24 hours: Blood pressure 128/74, pulse 81, temperature 98.3 F (36.8 C), temperature source Oral, resp. rate 20, height 5\' 6"  (1.676 m), weight 232 lb 1.6 oz (105.28 kg), last menstrual period 04/09/2011, SpO2 99.00%.  Awake, alert, NAD. IV infusing without difficulty, NS at 50. Intake/Output from previous day:   Intake/Output this shift: Total I/O In: -  Out: 600 [Urine:600] Needs strict I/O q shift. Note very likely to have post obstructive diuresis also.  Physical exam: Pale, respirations not labored RA in bed. Poor dentition. Peripheral IV site ok. Foley. Lungs without wheezes or rales. Cor RRR. Abdomen full, not any more distended than at exam yesterday, midline incision clean and dry. No erythema lower panus now. Swelling RLE a little better. No apparent bleeding. Moves all extremities easily in bed. Speech fluent. No focal neuro deficits.  Lab Results:  Basename 06/13/11 0505 06/12/11 0930  WBC 10.5 10.8*  HGB 7.7* 9.0*  HCT 23.1* 26.5*  PLT 356  365   BMET  Basename 06/13/11 0505 06/12/11 0930  NA 139 144  K 4.8 4.3  CL 104 106  CO2 27 24  GLUCOSE 96 109*  BUN 31* 30*  CREATININE 4.15* 3.76*  CALCIUM 9.5 9.5    Studies/Results: US Renal  06/12/2011  *RADIOLOGY REPORT*  Clinical Data: Acute renal failure; history of hysterectomy and oophorectomy for uterine cancer (05/31/2011).  RENAL/URINARY TRACT ULTRASOUND COMPLETE  Comparison:  CT of the abdomen pelvis - 05/07/2011  Findings:  Examination is slightly degraded secondary to patient body habitus.  Right Kidney:  The right kidney is normal in size, measuring 12.2 cm in length.  Normal cortical echogenicity and thickness.  There is moderate right-sided hydronephrosis with dilatation of the visualized aspect of the visualized portion the superior aspect of the right ureter.  This finding is likely unchanged compared to prior abdominal CT performed 05/07/2011).  No perinephric stranding. There is a approximately 7 x 10 mm echogenic likely nonobstructing renal stone within the inferior aspect of the right kidney (image 29).  Left Kidney:  Suboptimally visualized secondary to poor sonographic window.  The left kidney appears grossly normal in size measuring approximately 13.3 cm in length.  Grossly normal cortical echogenicity and thickness.  There is mild fullness of the left renal pelvis and calyces.  No perinephric stranding.  No echogenic renal stones.  Bladder:  Decompressed with a Foley catheter.  IMPRESSION: 1.  Moderate right-sided pelvocaliectasis and ureterectasis of the superior aspect of the right ureter, likely unchanged compared to abdominal CT performed 05/07/2011 and possibly a chronic finding.  2.  Likely nonobstructing  1 cm right-sided renal stone.  3.  Mild fullness of the left sided collecting system suggestive of mild hydronephrosis.  Original Report Authenticated By: Waynard Reeds, M.D.     Assessment/Plan: 1.Nonoliguric renal failure apparently from bilateral  ureteral obstruction: creatinine higher today despite some hydration. I have spoken to IR to make them aware that PCNs need to be done today if at all possible. She will need to be monitored for possible post obstructive diuresis and to follow electrolytes after procedure. This is new in past 2 weeks per labs in EMR. 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 Mild microcytic anemia preop (05-07-11 hgb 11.9 with MCV 76.9), transfused 2 u PRBCs post op. Hgb this am down to 7.7 with some hydration. She agrees to PRBCs, which I have ordered for after PCNs placed. Iron studies also ordered 4.peripheral IV access not adequate for chemo: is presently scheduled for [facility] outpatient 06-21-11. 5.degenerative lumbar disc disease 6.morbid obesity, 50 lb weight loss in past 8 months, met with [facility] dietician yesterday. 7.nephrolithiasis by [place] above 8.poor dentition 9.constipation related to disease in pelvis and prn pain medication. Bowel regimen increased on admission 10.DVT prophylaxis: still on lovenox PTA from recent gyn onc surgery, last dose 2-25 at 8 pm. Use SCDs until lovenox can be resumed.  11.no insurance. Has been approved for financial assistance per my office. Does have patient assistance funds that can be used at [facility] outpatient pharmacy for meds. Jackie Cook P

## 2011-06-13 NOTE — Interval H&P Note (Cosign Needed)
History and Physical Interval Note:  06/13/2011 9:49 AM  Jackie Cook is scheduled for bilateral percutaneous nephrostomies today. The various methods of treatment have been discussed with the patient . After consideration of risks, benefits and other options for treatment, the patient has consented to the above procedure .  The patients' history has been reviewed, patient examined, no change in status, stable for the above procedure.  I have reviewed the patients' chart and labs.  Questions were answered to the patient's satisfaction.   Past Medical History  Diagnosis Date  . Anemia     Iron deficiency  . Blood transfusion     2001  . H/O hiatal hernia   . 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 H/O menorrhagia   . DDD (degenerative disc disease)    Past Surgical History  Procedure Date  . Total abdominal hysterectomy w/ bilateral salpingoophorectomy 05/22/11   Results for orders placed during the hospital encounter of 06/12/11  CBC      Component Value Range   WBC 10.5  4.0 - 10.5 (K/uL)   RBC 2.92 (*) 3.87 - 5.11 (MIL/uL)   Hemoglobin 7.7 (*) 12.0 - 15.0 (g/dL)   HCT 40.9 (*) 81.1 - 46.0 (%)   MCV 79.1  78.0 - 100.0 (fL)   MCH 26.4  26.0 - 34.0 (pg)   MCHC 33.3  30.0 - 36.0 (g/dL)   RDW 91.4 (*) 78.2 - 15.5 (%)   Platelets 356  150 - 400 (K/uL)  BASIC METABOLIC PANEL      Component Value Range   Sodium 139  135 - 145 (mEq/L)   Potassium 4.8  3.5 - 5.1 (mEq/L)   Chloride 104  96 - 112 (mEq/L)   CO2 27  19 - 32 (mEq/L)   Glucose, Bld 96  70 - 99 (mg/dL)   BUN 31 (*) 6 - 23 (mg/dL)   Creatinine, Ser 9.56 (*) 0.50 - 1.10 (mg/dL)   Calcium 9.5  8.4 - 21.3 (mg/dL)   GFR calc non Af Amer 11 (*) >90 (mL/min)   GFR calc Af Amer 13 (*) >90 (mL/min)   Chest 2 View  05/18/2011  *RADIOLOGY REPORT*  Clinical Data: Preoperative respiratory film.  CHEST - 2 VIEW  Comparison: None.  Findings: A left suprahilar nodule is seen on the  PA view only and measures 2.4 cm cranial-caudal.  A second nodule is seen in the right upper lobe projecting over the right fifth rib measuring 0.6 cm in diameter.  No other nodule is identified.  There is no pneumothorax or pleural effusion.  Heart size is normal.  IMPRESSION: Bilateral pulmonary nodules.  CT scan of the chest with contrast is recommended for further evaluation.  These results will be called to the ordering clinician or representative by the Radiologist Assistant, and communication documented in the PACS Dashboard.  Original Report Authenticated By: Bernadene Bell. Maricela Curet, M.D.   Ct Angio Chest W/cm &/or Wo Cm  05/24/2011  *RADIOLOGY REPORT*  Clinical Data: Tachycardia.  Status post surgery for malignant pelvic mass.  Smoker. Question pulmonary embolism.  CT ANGIOGRAPHY CHEST  Technique:  Multidetector CT imaging of the chest using the standard protocol during bolus administration of intravenous contrast. Multiplanar reconstructed images including MIPs were obtained and reviewed to evaluate the vascular anatomy.  Contrast: OMNIPAQUE IOHEXOL 350 MG/ML IV SOLN  Comparison: Abdominal pelvic CT 05/07/2011.  Chest radiographs 05/18/2011.  Findings: The pulmonary arteries are  well opacified with contrast. There is no evidence of acute pulmonary embolism.  There are no enlarged mediastinal or hilar lymph nodes.  A small hiatal hernia is noted.  There is no pleural or pericardial effusion.  There are multiple pulmonary nodules bilaterally consistent with metastatic disease. Largest nodule is in the left upper lobe, measuring 2.4 x 1.8 cm on image 28.  There are right upper lobe nodules on images 26 and 37. There is a lingular nodule on image 43 in the left lower lobe nodule on image 50.  The latter measures 11 mm in diameter and has slightly enlarged compared with the prior abdominal CT.  There is a questionable filling defect near the origin of the right mainstem bronchus, best seen on coronal image  64.  This may reflect a retained secretion or polyp.  The visualized upper abdomen appears unremarkable.  There are no suspicious osseous findings.  IMPRESSION:  1.  No evidence of acute pulmonary embolism. 2.  Multiple pulmonary nodules bilaterally consistent with metastatic disease. 3.  Possible polyp or retained secretion near the origin of the right mainstem bronchus.  Original Report Authenticated By: Gerrianne Scale, M.D.   US Renal  06/12/2011  *RADIOLOGY REPORT*  Clinical Data: Acute renal failure; history of hysterectomy and oophorectomy for uterine cancer (05/31/2011).  RENAL/URINARY TRACT ULTRASOUND COMPLETE  Comparison:  CT of the abdomen pelvis - 05/07/2011  Findings:  Examination is slightly degraded secondary to patient body habitus.  Right Kidney:  The right kidney is normal in size, measuring 12.2 cm in length.  Normal cortical echogenicity and thickness.  There is moderate right-sided hydronephrosis with dilatation of the visualized aspect of the visualized portion the superior aspect of the right ureter.  This finding is likely unchanged compared to prior abdominal CT performed 05/07/2011).  No perinephric stranding. There is a approximately 7 x 10 mm echogenic likely nonobstructing renal stone within the inferior aspect of the right kidney (image 29).  Left Kidney:  Suboptimally visualized secondary to poor sonographic window.  The left kidney appears grossly normal in size measuring approximately 13.3 cm in length.  Grossly normal cortical echogenicity and thickness.  There is mild fullness of the left renal pelvis and calyces.  No perinephric stranding.  No echogenic renal stones.  Bladder:  Decompressed with a Foley catheter.  IMPRESSION: 1.  Moderate right-sided pelvocaliectasis and ureterectasis of the superior aspect of the right ureter, likely unchanged compared to abdominal CT performed 05/07/2011 and possibly a chronic finding.  2.  Likely nonobstructing 1 cm right-sided renal  stone.  3.  Mild fullness of the left sided collecting system suggestive of mild hydronephrosis.  Original Report Authenticated By: Waynard Reeds, M.D.    Chinita Pester

## 2011-06-13 NOTE — Progress Notes (Addendum)
PROGRESS NOTE  KEYATTA TOLLES FAO:[id number] DOB: 04-26-1960 DOA: 06/12/2011 PCP: No primary provider on file.  Brief narrative: Jackie Cook is an 51 y.o. female with recent diagnosis of metastatic leiomyosarcoma of uterus who presented to the ER upon the advice of Dr. Darrold Span, who she saw 06/12/11, after routine blood work revealed significant renal impairment. She had a known history of hydronephrosis related to uterine cancer, and it was suspected as the source of her deterioration in renal function.  Renal US showed bilateral hydronephrosis, and urology was consulted for consideration of percutaneous nephrostomy tube placement.  Consultants:  Dr. Tama Gander, Urology  Dr. Jama Flavors, Oncology  Dr. Maryclare Bean, Interventional radiology  Procedures:  Bilateral percutaneous nephrostomies 06/13/11   Antibiotics:  Cipro 06/13/11 x 1 dose   Subjective  Jackie Cook is having some back discomfort after nephrostomy tube placement.  She has had some nausea, but no vomiting.   Objective    Interim History: Stable over night.  Bilateral nephrostomy tubes placed by interventional radiology today.   Objective: Filed Vitals:   06/13/11 1727 06/13/11 1802 06/13/11 1805 06/13/11 1848  BP: 143/63  139/55 128/57  Pulse: 86  83 83  Temp: 98.4 F (36.9 C)  98.1 F (36.7 C) 99.1 F (37.3 C)  TempSrc: Oral  Oral Oral  Resp: 18  18 18   Height:      Weight:      SpO2: 100% 100%      Intake/Output Summary (Last 24 hours) at 06/13/11 1932 Last data filed at 06/13/11 1851  Gross per 24 hour  Intake  312.5 ml  Output   1650 ml  Net -1337.5 ml    Exam: Gen:  NAD Cardiovascular:  RRR, No M/R/G Respiratory: Lungs CTAB Gastrointestinal: Abdomen soft, NT/ND with normal active bowel sounds. Extremities: RLE edema    Data Reviewed: Basic Metabolic Panel:  Lab 06/13/11 6962 06/12/11 0930  NA 139 144  K 4.8 4.3  CL 104 106  CO2 27 24  GLUCOSE 96 109*  BUN  31* 30*  CREATININE 4.15* 3.76*  CALCIUM 9.5 9.5  MG -- --  PHOS -- --   Liver Function Tests:  Lab 06/12/11 0930  AST 11  ALT <8  ALKPHOS 64  BILITOT 0.4  PROT 6.6  ALBUMIN 3.4*   CBC:  Lab 06/13/11 0505 06/12/11 0930  WBC 10.5 10.8*  NEUTROABS -- 8.1*  HGB 7.7* 9.0*  HCT 23.1* 26.5*  MCV 79.1 81.6  PLT 356 365     Recent Results (from the past 240 hour(s))  URINE CULTURE     Status: Normal   Collection Time   06/12/11 10:39 AM      Component Value Range Status Comment   Urine Culture, Routine Culture, Urine   Final      Studies:   US Renal 06/12/2011  IMPRESSION: 1.  Moderate right-sided pelvocaliectasis and ureterectasis of the superior aspect of the right ureter, likely unchanged compared to abdominal CT performed 05/07/2011 and possibly a chronic finding.  2.  Likely nonobstructing 1 cm right-sided renal stone.  3.  Mild fullness of the left sided collecting system suggestive of mild hydronephrosis.  Original Report Authenticated By: Waynard Reeds, M.D.    Scheduled Meds:   . ciprofloxacin  400 mg Intravenous On Call  . docusate sodium  200 mg Oral Daily  . mulitivitamin with minerals  1 tablet Oral Daily  . polyethylene glycol powder  17 g Oral Daily  .  DISCONTD: enoxaparin  30 mg Subcutaneous Q24H   Continuous Infusions:   . sodium chloride 75 mL/hr (06/13/11 0850)  . DISCONTD: sodium chloride 50 mL/hr at 06/13/11 0503     Assessment/Plan: Assessment & Plan:  Principal Problem:  *ARF (acute renal failure)  Likely secondary to obstructive uropathy in the setting of her history of uterine cancer with bulky pelvic disease. I have spoken with the urologist on call who saw her and agreed with nephrostomy tube placement. A renal ultrasound confirmed bilateral hydronephrosis.  No improvement in renal function yet, but now that tubes are placed, expect recovery of renal function. Active Problems:  Leiomyosarcoma of uterus  Patient is status post total  abdominal hysterectomy but her disease has metastasized to the lungs and treatment options are not curative.  Dr. Darrold Span has seen the patient.  She plans gemcitabine/ taxotere in palliative attempt when stable (outpatient). Hydronephrosis, bilateral Status post placement of bilateral percutaneous nephrostomy tubes.  Obesity (BMI 30-39.9)  Patient has had significant weight loss but remains obese with a BMI of 38.1. Seen by dietitian on 06/13/11.    Constipation  We will continue the patient on daily MiraLAX and Colace.  Nausea  We'll give anti-emetics when necessary.  Right leg edema  Likely from compressive lymphadenopathy. She apparently had a Doppler exam done which ruled out DVT within the past month.  Normocytic anemia Getting blood today.  Likely from AOCD   Code Status: Full code Family Communication: Spoke briefly with the patient's mother Disposition Plan: Home when stable   LOS: 1 day   Hillery Aldo, MD Pager [phone]  06/13/2011, 7:32 PM

## 2011-06-13 NOTE — H&P (View-Only) (Signed)
06/13/2011, 8:21 AM  Hospital day: 2 Antibiotics: none Chemotherapy: not yet begun   Appreciate assistance from Dr.Rama and Dr.Dahlstedt with admission last pm and plan for bilateral percutaneous nephrostomy tubes by IR as work in today.  I had met her as new patient at [facility] 06-12-2011, with nonoliguric renal failure apparent when chemistries resulted from that consultation visit.  Subjective: Slept little last pm and mild HA now, is NPO for procedure today. Last lovenox was 06-11-11 @ 1800; I have written SCDs until lovenox is resumed. She has been voiding thru night, total 600 cc urine charted. No bowel movement since 06-10-11, did have miralax and colace since admission. No increased shortness of breath. No noted bleeding. Appetite seems a little better (but NPO). Discussed PCNs., she understands and agrees. She fell in ED bathroom "right knee went out", hit face on sink without significant injury.  Objective: Vital signs in last 24 hours: Blood pressure 128/74, pulse 81, temperature 98.3 F (36.8 C), temperature source Oral, resp. rate 20, height 5' 6" (1.676 m), weight 232 lb 1.6 oz (105.28 kg), last menstrual period 04/09/2011, SpO2 99.00%.  Awake, alert, NAD. IV infusing without difficulty, NS at 50. Intake/Output from previous day:   Intake/Output this shift: Total I/O In: -  Out: 600 [Urine:600] Needs strict I/O q shift. Note very likely to have post obstructive diuresis also.  Physical exam: Pale, respirations not labored RA in bed. Poor dentition. Peripheral IV site ok. Foley. Lungs without wheezes or rales. Cor RRR. Abdomen full, not any more distended than at exam yesterday, midline incision clean and dry. No erythema lower panus now. Swelling RLE a little better. No apparent bleeding. Moves all extremities easily in bed. Speech fluent. No focal neuro deficits.  Lab Results:  Basename 06/13/11 0505 06/12/11 0930  WBC 10.5 10.8*  HGB 7.7* 9.0*  HCT 23.1* 26.5*  PLT 356  365   BMET  Basename 06/13/11 0505 06/12/11 0930  NA 139 144  K 4.8 4.3  CL 104 106  CO2 27 24  GLUCOSE 96 109*  BUN 31* 30*  CREATININE 4.15* 3.76*  CALCIUM 9.5 9.5    Studies/Results: Us Renal  06/12/2011  *RADIOLOGY REPORT*  Clinical Data: Acute renal failure; history of hysterectomy and oophorectomy for uterine cancer (05/31/2011).  RENAL/URINARY TRACT ULTRASOUND COMPLETE  Comparison:  CT of the abdomen pelvis - 05/07/2011  Findings:  Examination is slightly degraded secondary to patient body habitus.  Right Kidney:  The right kidney is normal in size, measuring 12.2 cm in length.  Normal cortical echogenicity and thickness.  There is moderate right-sided hydronephrosis with dilatation of the visualized aspect of the visualized portion the superior aspect of the right ureter.  This finding is likely unchanged compared to prior abdominal CT performed 05/07/2011).  No perinephric stranding. There is a approximately 7 x 10 mm echogenic likely nonobstructing renal stone within the inferior aspect of the right kidney (image 29).  Left Kidney:  Suboptimally visualized secondary to poor sonographic window.  The left kidney appears grossly normal in size measuring approximately 13.3 cm in length.  Grossly normal cortical echogenicity and thickness.  There is mild fullness of the left renal pelvis and calyces.  No perinephric stranding.  No echogenic renal stones.  Bladder:  Decompressed with a Foley catheter.  IMPRESSION: 1.  Moderate right-sided pelvocaliectasis and ureterectasis of the superior aspect of the right ureter, likely unchanged compared to abdominal CT performed 05/07/2011 and possibly a chronic finding.  2.  Likely nonobstructing   1 cm right-sided renal stone.  3.  Mild fullness of the left sided collecting system suggestive of mild hydronephrosis.  Original Report Authenticated By: JOHN A. WATTS V, M.D.     Assessment/Plan: 1.Nonoliguric renal failure apparently from bilateral  ureteral obstruction: creatinine higher today despite some hydration. I have spoken to IR to make them aware that PCNs need to be done today if at all possible. She will need to be monitored for possible post obstructive diuresis and to follow electrolytes after procedure. This is new in past 2 weeks per labs in EMR. 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 Mild microcytic anemia preop (05-07-11 hgb 11.9 with MCV 76.9), transfused 2 u PRBCs post op. Hgb this am down to 7.7 with some hydration. She agrees to PRBCs, which I have ordered for after PCNs placed. Iron studies also ordered 4.peripheral IV access not adequate for chemo: is presently scheduled for PAC outpatient 06-21-11. 5.degenerative lumbar disc disease 6.morbid obesity, 50 lb weight loss in past 8 months, met with [facility] dietician yesterday. 7.nephrolithiasis by US above 8.poor dentition 9.constipation related to disease in pelvis and prn pain medication. Bowel regimen increased on admission 10.DVT prophylaxis: still on lovenox PTA from recent gyn onc surgery, last dose 2-25 at 8 pm. Use SCDs until lovenox can be resumed.  11.no insurance. Has been approved for financial assistance per my office. Does have patient assistance funds that can be used at [facility] outpatient pharmacy for meds. Kerrington Greenhalgh P     

## 2011-06-13 NOTE — Procedures (Signed)
B 10 Fr PCN No comp

## 2011-06-13 NOTE — Progress Notes (Signed)
Patient received five prescriptions from [facility] on 06/12/11 $33.44,her remaning balance [facility] $366.56.

## 2011-06-13 NOTE — Progress Notes (Signed)
INITIAL ADULT NUTRITION ASSESSMENT Date: 06/13/2011   Time: 1:56 PM Reason for Assessment: Consult  ASSESSMENT: Female 51 y.o.  Dx: ARF (acute renal failure)  Hx:  Past Medical History  Diagnosis Date  . Anemia     Iron deficiency  . Blood transfusion     2001  . H/O hiatal hernia   . 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 H/O menorrhagia   . DDD (degenerative disc disease)    Related Meds:  Scheduled Meds:   . sodium chloride   Intravenous Once  . ciprofloxacin  400 mg Intravenous On Call  . docusate sodium  200 mg Oral Daily  . mulitivitamin with minerals  1 tablet Oral Daily  . polyethylene glycol powder  17 g Oral Daily  . DISCONTD: enoxaparin  30 mg Subcutaneous Q24H  . DISCONTD: loperamide  4 mg Oral Once  . DISCONTD: ondansetron  8 mg Oral Once   Continuous Infusions:   . sodium chloride 75 mL/hr (06/13/11 0850)  . DISCONTD: sodium chloride 50 mL/hr at 06/13/11 0503   PRN Meds:.acetaminophen, acetaminophen, acetaminophen, Glycerin (Adult), nicotine, ondansetron (ZOFRAN) IV, ondansetron, oxyCODONE-acetaminophen  Ht: 5\' 6"  (167.6 cm)  Wt: 232 lb 1.6 oz (105.28 kg) (bed scale)  Ideal Wt: 59kg % Ideal Wt: 178  Usual Wt: 109kg % Usual Wt: 96  Body mass index is 37.46 kg/(m^2).   Class II obesity  Food/Nutrition Related Hx: Pt with metastatic leiomyosarcoma of uterus, not started chemotherapy yet, and has meet with [facility] Dietitian. Pt with 50 pound weight loss in the past 8 months, however it was intentional per pt report. Pt states she was cutting back on sweets, avoiding fried foods, and eating more fruits and salad. Pt states she has had nausea for the past week but is starting to get her appetite back. Pt denies any recent weight loss in the past month. Pt scheduled to have bilateral percutaneous nephrostomy tubes placed today. Pt reports having occasional constipation at home, on Colace.    Renal ultrasound yesterday showed: 1. Moderate right-sided pelvocaliectasis and ureterectasis of the  superior aspect of the right ureter, likely unchanged compared to  abdominal CT performed 05/07/2011 and possibly a chronic finding.  2. Likely nonobstructing 1 cm right-sided renal stone.  3. Mild fullness of the left sided collecting system suggestive of  mild hydronephrosis.  Labs:  CMP     Component Value Date/Time   NA 139 06/13/2011 0505   K 4.8 06/13/2011 0505   CL 104 06/13/2011 0505   CO2 27 06/13/2011 0505   GLUCOSE 96 06/13/2011 0505   BUN 31* 06/13/2011 0505   CREATININE 4.15* 06/13/2011 0505   CALCIUM 9.5 06/13/2011 0505   PROT 6.6 06/12/2011 0930   ALBUMIN 3.4* 06/12/2011 0930   AST 11 06/12/2011 0930   ALT <8 06/12/2011 0930   ALKPHOS 64 06/12/2011 0930   BILITOT 0.4 06/12/2011 0930   GFRNONAA 11* 06/13/2011 0505   GFRAA 13* 06/13/2011 0505    Intake/Output Summary (Last 24 hours) at 06/13/11 1404 Last data filed at 06/13/11 0737  Gross per 24 hour  Intake      0 ml  Output    600 ml  Net   -600 ml   Last BM - 06/10/11  Diet Order: NPO  IVF:    sodium chloride Last Rate: 75 mL/hr (06/13/11 0850)  DISCONTD: sodium chloride Last Rate: 50 mL/hr at 06/13/11 0503  Estimated Nutritional Needs:   Kcal:1800-2050 Protein:70-90g Fluid:1.8-2L  NUTRITION DIAGNOSIS: -Inadequate oral intake (NI-2.1).  Status: Ongoing  RELATED TO: nausea PTA, currently not eating r/t surgery  AS EVIDENCE BY: NPO for surgery  MONITORING/EVALUATION(Goals): Advance diet as tolerated to bland diet.   EDUCATION NEEDS: -No education needs identified at this time  INTERVENTION: Diet advancement per MD. Hopefully once diet advanced and nausea resolves, intake will improve.   Dietitian #: [phone]  DOCUMENTATION CODES Per approved criteria  -Obesity Unspecified    Jackie Cook 06/13/2011, 1:56 PM

## 2011-06-14 ENCOUNTER — Telehealth: Payer: Self-pay | Admitting: Oncology

## 2011-06-14 ENCOUNTER — Encounter (HOSPITAL_COMMUNITY): Payer: Self-pay | Admitting: Gynecologic Oncology

## 2011-06-14 LAB — CBC
HCT: 25.9 % — ABNORMAL LOW (ref 36.0–46.0)
Hemoglobin: 8.1 g/dL — ABNORMAL LOW (ref 12.0–15.0)
MCH: 26.6 pg (ref 26.0–34.0)
MCHC: 33.6 g/dL (ref 30.0–36.0)
MCV: 79.6 fL (ref 78.0–100.0)
MCV: 79.9 fL (ref 78.0–100.0)
RBC: 3.04 MIL/uL — ABNORMAL LOW (ref 3.87–5.11)
RDW: 15.5 % (ref 11.5–15.5)

## 2011-06-14 LAB — BASIC METABOLIC PANEL
BUN: 35 mg/dL — ABNORMAL HIGH (ref 6–23)
CO2: 25 mEq/L (ref 19–32)
Calcium: 9.3 mg/dL (ref 8.4–10.5)
Glucose, Bld: 91 mg/dL (ref 70–99)
Potassium: 5.2 mEq/L — ABNORMAL HIGH (ref 3.5–5.1)
Sodium: 141 mEq/L (ref 135–145)

## 2011-06-14 LAB — TYPE AND SCREEN: ABO/RH(D): O POS

## 2011-06-14 MED ORDER — DEXTROSE-NACL 5-0.9 % IV SOLN
INTRAVENOUS | Status: DC
  Administered 2011-06-14 (×2): via INTRAVENOUS

## 2011-06-14 MED ORDER — FERROUS FUMARATE 325 (106 FE) MG PO TABS
106.0000 mg | ORAL_TABLET | Freq: Every day | ORAL | Status: DC
  Administered 2011-06-14 – 2011-06-18 (×5): 106 mg via ORAL
  Filled 2011-06-14 (×6): qty 1

## 2011-06-14 MED ORDER — ENOXAPARIN SODIUM 40 MG/0.4ML ~~LOC~~ SOLN: 40.0000 mg | SUBCUTANEOUS | Status: DC

## 2011-06-14 MED ORDER — ENOXAPARIN SODIUM 30 MG/0.3ML ~~LOC~~ SOLN
30.0000 mg | SUBCUTANEOUS | Status: DC
  Administered 2011-06-14: 30 mg via SUBCUTANEOUS
  Filled 2011-06-14 (×2): qty 0.3

## 2011-06-14 NOTE — Telephone Encounter (Signed)
had called for pt and she is in the hosp,will mail appt info even though it may change  aom

## 2011-06-14 NOTE — Progress Notes (Signed)
  06/14/2011, 7:51 AM  Hospital day: 3 Antibiotics: none Chemotherapy: not begun as yet    Subjective: Feeling better than on admission, with less nausea and finally slept last pm. Prn IV dilaudid helpful for nephrostomy pain, total 3 doses. No BM yet, no miralax yesterday due to NPO for procedure, does have flatus. Transfused 1 unit PRBCs last pm. No increased shortness of breath. No vaginal bleeding. Has not been out of bed overnight, understands she needs assistance for this.   Objective: Vital signs in last 24 hours: Blood pressure 157/79, pulse 89, temperature 97.2 F (36.2 C), temperature source Oral, resp. rate 20, height 5\' 6"  (1.676 m), weight 232 lb 1.6 oz (105.28 kg), last menstrual period 04/09/2011, SpO2 100.00%.  Alert, looks fairly comfortable at 45 degrees with pillows to back. Respirations not labored on RA. IV right antecubital, presently NS at 75 which I have changed to D5NS at 125 now. Bilateral percutaneous nephrostomy tubes with blood tinged urine, more on left. Lungs without wheezes or rales anterior/lateral. Mouth moist and clear. Cor RRR.  Abdomen with more active bowel sounds, soft, not distended, no erythema at lower abdomen now, midline incision clean and dry. Nothing palpable in RLQ where intermittent discomfort. RLE less swelling especially lower leg, probably with elevation since admission, still swelling right thigh tho less. No significant swelling LLE. Feet warm. Moves all extremities easily. SCDs are not on tho are ordered.  Intake/Output from previous day: 02/27 0701 - 02/28 0700 In: 1825 [I.V.:1512.5; Blood:312.5] Out: 3809 [Urine:3809] Intake/Output this shift:    Lab Results:  Basename 06/14/11 0515 06/13/11 0505  WBC 10.5 10.5  HGB 8.1* 7.7*  HCT 24.2* 23.1*  PLT 324 356  Iron 28, % sat 14, ferritin 506 (acute phase reactant) BMET  Basename 06/14/11 0515 06/13/11 0505  NA 141 139  K 5.2* 4.8  CL 107 104  CO2 25 27  GLUCOSE 91 96  BUN 35*  31*  CREATININE 4.31* 4.15*  CALCIUM 9.3 9.5   Urine culture 06-12-11 negative  Studies/Results: RLE venous doppler report seen from 05-31-11, no DVT IR percutaneous nephrostomy procedure notes seen  Assessment/Plan: 1.Acute renal failure secondary to bilateral ureteral obstruction from metastatic leiomyosarcoma: post bilateral PCN placement by IR 06-13-11. Per Dr.Dahlstedt, should keep PCNs instead of trying to internalize stents. No improvement in creatinine yet and need to be sure that she does not get dehydrated, as she seems to be having some post-obstructive diuresis; present IVF may need to increase further today. I would have low threshold for asking  nephrology to give recommendations if no improvement in next 12 - 24 hrs. 2.Metastatic leiomyosarcoma of uterus in pelvis and clinically to lung: chemo in palliative attempt if other situation stabilizes to allow this (outpatient). 3.Anemia: hgb prior to gyn onc surgery in early Feb ~ 12. Will resume oral iron and follow. 4.Inadequate peripheral IV access for chemo: will need PAC by IR, possibly before DC this hospitalization (presently scheduled as outpatient 06-21-11. 5.Constipation related to pain meds and disease in pelvis: continue miralax/ stool softner 6.DVT prophylaxis: expect can resume lovenox, dose per pharmacy.   I will round again in am. Please call if my partners can help prior to that. Sahmya Arai P

## 2011-06-14 NOTE — Progress Notes (Signed)
PROGRESS NOTE  Jackie Cook RUE:[id number] DOB: [birth year] DOA: 06/12/2011 PCP: No primary provider on file.  Brief narrative: Jackie Cook is an 51 y.o. female with recent diagnosis of metastatic leiomyosarcoma of uterus who presented to the ER upon the advice of Dr. Darrold Span, who she saw 06/12/11, after routine blood work revealed significant renal impairment. She had a known history of hydronephrosis related to uterine cancer, and it was suspected as the source of her deterioration in renal function.  Renal US showed bilateral hydronephrosis, and urology was consulted for consideration of percutaneous nephrostomy tube placement, which was done by IR on 06/13/11.  Consultants:  Dr. Tama Gander, Urology  Dr. Jama Flavors, Oncology  Dr. Maryclare Bean, Interventional radiology  Procedures:  Bilateral percutaneous nephrostomies 06/13/11   Antibiotics:  Cipro 06/13/11 x 1 dose   Subjective  Jackie Cook continues to have flank discomfort but it is controlled with Dilaudid.  She denies N/V.  No BM in several days.    Objective    Interim History: Stable over night.    Objective: Filed Vitals:   06/13/11 1945 06/13/11 2045 06/13/11 2125 06/14/11 0545  BP: 136/53 126/47 138/54 157/79  Pulse: 85 85 85 89  Temp: 97.3 F (36.3 C) 98.1 F (36.7 C) 98.8 F (37.1 C) 97.2 F (36.2 C)  TempSrc: Oral Oral Oral Oral  Resp: 16 16 20 20   Height:      Weight:      SpO2: 100% 99% 98% 100%    Intake/Output Summary (Last 24 hours) at 06/14/11 1358 Last data filed at 06/14/11 1300  Gross per 24 hour  Intake   4722 ml  Output   5709 ml  Net   -987 ml    Exam: Gen:  NAD Cardiovascular:  RRR, No M/R/G Respiratory: Lungs CTAB Gastrointestinal: Abdomen soft, NT/ND with normal active bowel sounds. Extremities: RLE edema    Data Reviewed: Basic Metabolic Panel:  Lab 06/14/11 1478 06/13/11 0505 06/12/11 0930  NA 141 139 144  K 5.2* 4.8 --  CL 107 104 106  CO2 25  27 24   GLUCOSE 91 96 109*  BUN 35* 31* 30*  CREATININE 4.31* 4.15* 3.76*  CALCIUM 9.3 9.5 9.5  MG -- -- --  PHOS -- -- --   Liver Function Tests:  Lab 06/12/11 0930  AST 11  ALT <8  ALKPHOS 64  BILITOT 0.4  PROT 6.6  ALBUMIN 3.4*   CBC:  Lab 06/14/11 0940 06/14/11 0515 06/13/11 0505 06/12/11 0930  WBC 11.0* 10.5 10.5 10.8*  NEUTROABS -- -- -- 8.1*  HGB 8.7* 8.1* 7.7* 9.0*  HCT 25.9* 24.2* 23.1* 26.5*  MCV 79.9 79.6 79.1 81.6  PLT 348 324 356 365     Recent Results (from the past 240 hour(s))  URINE CULTURE     Status: Normal   Collection Time   06/12/11 10:39 AM      Component Value Range Status Comment   Urine Culture, Routine Culture, Urine   Final      Studies:   US Renal 06/12/2011  IMPRESSION: 1.  Moderate right-sided pelvocaliectasis and ureterectasis of the superior aspect of the right ureter, likely unchanged compared to abdominal CT performed 05/07/2011 and possibly a chronic finding.  2.  Likely nonobstructing 1 cm right-sided renal stone.  3.  Mild fullness of the left sided collecting system suggestive of mild hydronephrosis.  Original Report Authenticated By: Waynard Reeds, M.D.    Scheduled Meds:    .  ciprofloxacin  400 mg Intravenous On Call  . docusate sodium  200 mg Oral Daily  . enoxaparin (LOVENOX) injection  30 mg Subcutaneous Q24H  . ferrous fumarate  106 mg of iron Oral Daily  . mulitivitamin with minerals  1 tablet Oral Daily  . polyethylene glycol powder  17 g Oral Daily  . DISCONTD: enoxaparin  40 mg Subcutaneous Q24H   Continuous Infusions:    . dextrose 5 % and 0.9% NaCl 125 mL/hr at 06/14/11 0837  . DISCONTD: sodium chloride 75 mL/hr (06/13/11 0850)     Assessment/Plan: Assessment & Plan:  Principal Problem:  *ARF (acute renal failure)  Likely secondary to obstructive uropathy in the setting of her history of uterine cancer with bulky pelvic disease.  Renal US was done on 06/12/11 which showed bilateral hydronephrosis.    She was seen by the urologist who and with nephrostomy tube placement, which was done by IR on 06/13/11.  No improvement in renal function yet despite nephrostomy tube placement, but good urine output.  Will monitor another 24 hours, and if no improvement by tomorrow, obtain a nephrology consultation.  Continue IVF. 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  Dr. Darrold Span has seen the patient.  She plans gemcitabine/ taxotere in palliative attempt when stable (outpatient).  PAC placement scheduled by IR on 06/21/11 as an outpatient. Hydronephrosis, bilateral Status post placement of bilateral percutaneous nephrostomy tubes.  Obesity (BMI 30-39.9)  Patient has had significant weight loss but remains obese with a BMI of 38.1. Seen by dietitian on 06/13/11.    Constipation  We will continue the patient on daily MiraLAX and Colace. May need to step up therapy if no BM in next 24 hours. Nausea  We'll give anti-emetics when necessary.  Right leg edema  Likely from compressive lymphadenopathy. She apparently had a Doppler exam done which ruled out DVT within the past month.  Normocytic anemia The patient was given 1 unit of PRBCs on 06/13/11.  Likely from AOCD.  Continue iron.   Code Status: Full code Family Communication: Jackie Cook [phone] Disposition Plan: Home when stable   LOS: 2 days   Hillery Aldo, MD Pager [phone]  06/14/2011, 1:58 PM

## 2011-06-14 NOTE — Progress Notes (Signed)
Brief Pharmacy Note  Was asked to assist with Lovenox dosing for extended VTE prophylaxis after exploratory laparotomy, supracervical hysterectomy 2/5 for uterine cancer.  Lovenox dosage prior to admission was 40mg  SQ q24h.  Acute renal failure was present on admission, so Lovenox was held in anticipation of percutaneous nephrostomy tube placement.  That procedure was completed and Lovenox is now being resumed.  SCr 4.31, CrCl 11mL/min H/H  8.7/25.9  Pltc 348K  P:  Have resumed Lovenox but at a reduced dosage (30mg  SQ q24h) for CrCl < 76mL/min.  Will follow SCr while inpatient and adjust Lovenox as needed.  Elie Goody, PharmD, BCPS Pager: [phone] 06/14/2011  11:55 AM

## 2011-06-14 NOTE — Progress Notes (Signed)
Subjective: Patient doing well; has some mild flank discomfort; no nausea/vomiting  Objective: Vital signs in last 24 hours: Temp:  [97.2 F (36.2 C)-99.1 F (37.3 C)] 97.2 F (36.2 C) (02/28 0545) Pulse Rate:  [75-89] 89  (02/28 0545) Resp:  [11-22] 20  (02/28 0545) BP: (117-157)/(47-85) 157/79 mmHg (02/28 0545) SpO2:  [97 %-100 %] 100 % (02/28 0545) Last BM Date: 06/10/11  Intake/Output from previous day: 02/27 0701 - 02/28 0700 In: 1825 [I.V.:1512.5; Blood:312.5] Out: 3809 [Urine:3809] Intake/Output this shift: Total I/O In: 480 [P.O.:480] Out: 2100 [Urine:2100]  Bilateral PCN's intact, outputs-(R) 250 cc's, (L) 1850 cc's blood-tinged urine, insertion sites clean and dry, mildly tender to palpation  Lab Results:   [facility] 06/14/11 0940 06/14/11 0515  WBC 11.0* 10.5  HGB 8.7* 8.1*  HCT 25.9* 24.2*  PLT 348 324   BMET  Basename 06/14/11 0515 06/13/11 0505  NA 141 139  K 5.2* 4.8  CL 107 104  CO2 25 27  GLUCOSE 91 96  BUN 35* 31*  CREATININE 4.31* 4.15*  CALCIUM 9.3 9.5   PT/INR  Basename 06/13/11 1030  LABPROT 14.7  INR 1.13   ABG No results found for this basename: PHART:2,PCO2:2,PO2:2,HCO3:2 in the last 72 hours  Studies/Results: US Renal  06/12/2011  *RADIOLOGY REPORT*  Clinical Data: Acute renal failure; history of hysterectomy and oophorectomy for uterine cancer (05/31/2011).  RENAL/URINARY TRACT ULTRASOUND COMPLETE  Comparison:  CT of the abdomen pelvis - 05/07/2011  Findings:  Examination is slightly degraded secondary to patient body habitus.  Right Kidney:  The right kidney is normal in size, measuring 12.2 cm in length.  Normal cortical echogenicity and thickness.  There is moderate right-sided hydronephrosis with dilatation of the visualized aspect of the visualized portion the superior aspect of the right ureter.  This finding is likely unchanged compared to prior abdominal CT performed 05/07/2011).  No perinephric stranding. There is a  approximately 7 x 10 mm echogenic likely nonobstructing renal stone within the inferior aspect of the right kidney (image 29).  Left Kidney:  Suboptimally visualized secondary to poor sonographic window.  The left kidney appears grossly normal in size measuring approximately 13.3 cm in length.  Grossly normal cortical echogenicity and thickness.  There is mild fullness of the left renal pelvis and calyces.  No perinephric stranding.  No echogenic renal stones.  Bladder:  Decompressed with a Foley catheter.  IMPRESSION: 1.  Moderate right-sided pelvocaliectasis and ureterectasis of the superior aspect of the right ureter, likely unchanged compared to abdominal CT performed 05/07/2011 and possibly a chronic finding.  2.  Likely nonobstructing 1 cm right-sided renal stone.  3.  Mild fullness of the left sided collecting system suggestive of mild hydronephrosis.  Original Report Authenticated By: Waynard Reeds, M.D.   Ir Perc Nephrostomy Left  06/14/2011  *RADIOLOGY REPORT*  Clinical Data/Indication: RENAL FAILURE.  URETERAL OBSTRUCTION.  PERC NEPHROSTOMY*R*,PERC NEPHROSTOMY*L*,IR ULTRASOUND GUIDANCE TISSUE ABLATION  Sedation: Versed 4 mg, Fentanyl 200 mg.  Total Moderate Sedation Time: 30 minutes.  Contrast Volume: 30 ml Omnipaque-300.  Additional Medications: Ciprofloxacin 400 mg IV.  Fluoroscopy Time: 1.9 minutes.  Procedure: The procedure, risks, benefits, and alternatives were explained to the patient. Questions regarding the procedure were encouraged and answered. The patient understands and consents to the procedure.  The low back was prepped with betadine in a sterile fashion, and a sterile drape was applied covering the operative field. A sterile gown and sterile gloves were used for the procedure.  Under  sonographic guidance, a 21 gauge needle was inserted into the lower pole calix of the left kidney.  Contrast was injected.  Was removed over a 0018 wire which was upsized to a three J.  10-French dilator  followed by 10-French drain were inserted and coiled in the renal pelvis.  It was looped and string fixed.  Contrast was injected.  It was sewn to the skin.  The identical procedure was performed for the right kidney.  Findings: Bilateral lower pole renal collecting system access followed by bilateral percutaneous nephrostomy catheter placement into the renal pelvis bilaterally is documented.  Complications: None.  IMPRESSION: Bilateral percutaneous nephrostomy catheter placement.  Original Report Authenticated By: Donavan Burnet, M.D.   Ir Perc Nephrostomy Right  06/14/2011  *RADIOLOGY REPORT*  Clinical Data/Indication: RENAL FAILURE.  URETERAL OBSTRUCTION.  PERC NEPHROSTOMY*R*,PERC NEPHROSTOMY*L*,IR ULTRASOUND GUIDANCE TISSUE ABLATION  Sedation: Versed 4 mg, Fentanyl 200 mg.  Total Moderate Sedation Time: 30 minutes.  Contrast Volume: 30 ml Omnipaque-300.  Additional Medications: Ciprofloxacin 400 mg IV.  Fluoroscopy Time: 1.9 minutes.  Procedure: The procedure, risks, benefits, and alternatives were explained to the patient. Questions regarding the procedure were encouraged and answered. The patient understands and consents to the procedure.  The low back was prepped with betadine in a sterile fashion, and a sterile drape was applied covering the operative field. A sterile gown and sterile gloves were used for the procedure.  Under sonographic guidance, a 21 gauge needle was inserted into the lower pole calix of the left kidney.  Contrast was injected.  Was removed over a 0018 wire which was upsized to a three J.  10-French dilator followed by 10-French drain were inserted and coiled in the renal pelvis.  It was looped and string fixed.  Contrast was injected.  It was sewn to the skin.  The identical procedure was performed for the right kidney.  Findings: Bilateral lower pole renal collecting system access followed by bilateral percutaneous nephrostomy catheter placement into the renal pelvis bilaterally is  documented.  Complications: None.  IMPRESSION: Bilateral percutaneous nephrostomy catheter placement.  Original Report Authenticated By: Donavan Burnet, M.D.   Ir US Guide Bx Asp/drain  06/14/2011  *RADIOLOGY REPORT*  Clinical Data/Indication: RENAL FAILURE.  URETERAL OBSTRUCTION.  PERC NEPHROSTOMY*R*,PERC NEPHROSTOMY*L*,IR ULTRASOUND GUIDANCE TISSUE ABLATION  Sedation: Versed 4 mg, Fentanyl 200 mg.  Total Moderate Sedation Time: 30 minutes.  Contrast Volume: 30 ml Omnipaque-300.  Additional Medications: Ciprofloxacin 400 mg IV.  Fluoroscopy Time: 1.9 minutes.  Procedure: The procedure, risks, benefits, and alternatives were explained to the patient. Questions regarding the procedure were encouraged and answered. The patient understands and consents to the procedure.  The low back was prepped with betadine in a sterile fashion, and a sterile drape was applied covering the operative field. A sterile gown and sterile gloves were used for the procedure.  Under sonographic guidance, a 21 gauge needle was inserted into the lower pole calix of the left kidney.  Contrast was injected.  Was removed over a 0018 wire which was upsized to a three J.  10-French dilator followed by 10-French drain were inserted and coiled in the renal pelvis.  It was looped and string fixed.  Contrast was injected.  It was sewn to the skin.  The identical procedure was performed for the right kidney.  Findings: Bilateral lower pole renal collecting system access followed by bilateral percutaneous nephrostomy catheter placement into the renal pelvis bilaterally is documented.  Complications: None.  IMPRESSION: Bilateral percutaneous nephrostomy  catheter placement.  Original Report Authenticated By: Donavan Burnet, M.D.   Ir US Guide Bx Asp/drain  06/14/2011  *RADIOLOGY REPORT*  Clinical Data/Indication: RENAL FAILURE.  URETERAL OBSTRUCTION.  PERC NEPHROSTOMY*R*,PERC NEPHROSTOMY*L*,IR ULTRASOUND GUIDANCE TISSUE ABLATION  Sedation: Versed 4  mg, Fentanyl 200 mg.  Total Moderate Sedation Time: 30 minutes.  Contrast Volume: 30 ml Omnipaque-300.  Additional Medications: Ciprofloxacin 400 mg IV.  Fluoroscopy Time: 1.9 minutes.  Procedure: The procedure, risks, benefits, and alternatives were explained to the patient. Questions regarding the procedure were encouraged and answered. The patient understands and consents to the procedure.  The low back was prepped with betadine in a sterile fashion, and a sterile drape was applied covering the operative field. A sterile gown and sterile gloves were used for the procedure.  Under sonographic guidance, a 21 gauge needle was inserted into the lower pole calix of the left kidney.  Contrast was injected.  Was removed over a 0018 wire which was upsized to a three J.  10-French dilator followed by 10-French drain were inserted and coiled in the renal pelvis.  It was looped and string fixed.  Contrast was injected.  It was sewn to the skin.  The identical procedure was performed for the right kidney.  Findings: Bilateral lower pole renal collecting system access followed by bilateral percutaneous nephrostomy catheter placement into the renal pelvis bilaterally is documented.  Complications: None.  IMPRESSION: Bilateral percutaneous nephrostomy catheter placement.  Original Report Authenticated By: Donavan Burnet, M.D.   Results for orders placed in visit on 06/12/11  URINE CULTURE     Status: Normal   Collection Time   06/12/11 10:39 AM      Component Value Range Status Comment   Urine Culture, Routine Culture, Urine   Final      Anti-infectives: Anti-infectives     Start     Dose/Rate Route Frequency Ordered Stop   06/13/11 1100   ciprofloxacin (CIPRO) IVPB 400 mg     Comments: GIVE ON CALL TO XRAY FOR NEPHROSTOMIES TODAY(2/27)      400 mg 200 mL/hr over 60 Minutes Intravenous On call 06/13/11 1003 06/13/11 1729          Assessment/Plan: S/p bilateral percutaneous nephrostomies 2/27; monitor  labs, check PCN urine cx's/cytology, other plans as per urology/oncology   LOS: 2 days    Jonae Renshaw,D [facility] 06/14/2011

## 2011-06-15 LAB — URINE CULTURE
Colony Count: NO GROWTH
Culture  Setup Time: 201302281827

## 2011-06-15 LAB — CBC
MCH: 26.8 pg (ref 26.0–34.0)
MCV: 80.3 fL (ref 78.0–100.0)
Platelets: 378 10*3/uL (ref 150–400)
RDW: 15.9 % — ABNORMAL HIGH (ref 11.5–15.5)

## 2011-06-15 LAB — BASIC METABOLIC PANEL
CO2: 25 mEq/L (ref 19–32)
CO2: 24 mEq/L (ref 19–32)
Calcium: 10 mg/dL (ref 8.4–10.5)
Calcium: 9.2 mg/dL (ref 8.4–10.5)
Creatinine, Ser: 2.74 mg/dL — ABNORMAL HIGH (ref 0.50–1.10)
Creatinine, Ser: 2.17 mg/dL — ABNORMAL HIGH (ref 0.50–1.10)
GFR calc Af Amer: 22 mL/min — ABNORMAL LOW (ref >90)
GFR calc Af Amer: 29 mL/min — ABNORMAL LOW (ref >90)

## 2011-06-15 MED ORDER — FLEET ENEMA 7-19 GM/118ML RE ENEM: 1.0000 | ENEMA | Freq: Once | RECTAL | Status: AC | PRN

## 2011-06-15 MED ORDER — BISACODYL 10 MG RE SUPP
10.0000 mg | Freq: Once | RECTAL | Status: AC | PRN
  Administered 2011-06-15: 10 mg via RECTAL
  Filled 2011-06-15: qty 1

## 2011-06-15 MED ORDER — POLYETHYLENE GLYCOL 3350 17 G PO PACK
17.0000 g | PACK | Freq: Two times a day (BID) | ORAL | Status: DC
  Administered 2011-06-15 – 2011-06-19 (×6): 17 g via ORAL
  Filled 2011-06-15 (×10): qty 1

## 2011-06-15 MED ORDER — BISACODYL 10 MG RE SUPP: 10.0000 mg | Freq: Every day | RECTAL | Status: DC | PRN

## 2011-06-15 MED ORDER — VANCOMYCIN HCL IN DEXTROSE 1-5 GM/200ML-% IV SOLN
1000.0000 mg | Freq: Once | INTRAVENOUS | Status: AC
  Administered 2011-06-18: 1000 mg via INTRAVENOUS
  Filled 2011-06-15: qty 200

## 2011-06-15 MED ORDER — FLEET ENEMA 7-19 GM/118ML RE ENEM: 1.0000 | ENEMA | Freq: Every day | RECTAL | Status: DC | PRN

## 2011-06-15 MED ORDER — ENOXAPARIN SODIUM 40 MG/0.4ML ~~LOC~~ SOLN
40.0000 mg | Freq: Every day | SUBCUTANEOUS | Status: DC
  Administered 2011-06-15 – 2011-06-16 (×2): 40 mg via SUBCUTANEOUS
  Filled 2011-06-15 (×3): qty 0.4

## 2011-06-15 MED ORDER — DEXTROSE-NACL 5-0.9 % IV SOLN
INTRAVENOUS | Status: DC
  Administered 2011-06-15 – 2011-06-19 (×15): via INTRAVENOUS

## 2011-06-15 NOTE — H&P (Signed)
Agree 

## 2011-06-15 NOTE — Progress Notes (Signed)
Brief Pharmacy Note  Was asked to assist with Lovenox dosing for extended VTE prophylaxis after exploratory laparotomy, supracervical hysterectomy 2/5 for uterine cancer.  Lovenox dosage prior to admission was 40mg  SQ q24h.  Acute renal failure was present on admission, so Lovenox was held in anticipation of percutaneous nephrostomy tube placement.  That procedure was completed and Lovenox was resumed 2/28. Having good diuresis s/p PCNs.  SCr 4.31 -> 2.74, CrCl 97mL/min CBC stable. Weight: 109kg  Plan:  Will increase Lovenox to 40mg  SQ q24h.  Continue to follow SCr while inpatient and adjust Lovenox as needed.  Charolotte Eke, PharmD, pager [phone]. 06/15/2011,9:25 AM.

## 2011-06-15 NOTE — Progress Notes (Signed)
  Subjective: The patient is doing well.  No nausea or vomiting. Pain is adequately controlled. Urinary output is greater from the left nephrostomy than from the right.  Objective: Vital signs in last 24 hours: Temp:  [98.1 F (36.7 C)-98.7 F (37.1 C)] 98.1 F (36.7 C) (03/01 1419) Pulse Rate:  [85-91] 85  (03/01 1419) Resp:  [16-20] 16  (03/01 1419) BP: (131-154)/(54-84) 152/84 mmHg (03/01 1419) SpO2:  [98 %-100 %] 100 % (03/01 1419) Weight:  [108.6 kg (239 lb 6.7 oz)] 108.6 kg (239 lb 6.7 oz) (03/01 0700)  Intake/Output from previous day: 02/28 0701 - 03/01 0700 In: 5142 [P.O.:718; I.V.:4424] Out: 9475 [Urine:9475] Intake/Output this shift: Total I/O In: 6240 [P.O.:2040; I.V.:4200] Out: 2900 [Urine:2900]  Physical Exam:    Lab Results:  Basename 06/15/11 0525 06/14/11 0940 06/14/11 0515  HGB 9.4* 8.7* 8.1*  HCT 28.2* 25.9* 24.2*    Assessment/Plan: Bilateral malignant hydronephrosis, with nephrostomy tubes in place.  Her creatinine is improving.  I would leave her nephrostomy tubes, and have been changed every 4 weeks or so.  I would be happy to see her if further problems,. Otherwise, have the tubes changed every 4 weeks.   Bertram Millard. Hagen Bohorquez, MD  06/15/2011, 5:48 PM

## 2011-06-15 NOTE — Progress Notes (Signed)
  06/15/2011, 8:28 AM  Hospital day: 4 Antibiotics: none Chemotherapy: not begun as yet    Subjective: In pain from right PCN now, lying on back without padding there. IV dilaudid helpful. Has been pushing po fluids, however is at least 6 liters out> in since admission with IVF still at 125/hr (apparently confusion re I/O orders)ACE inhibitor therapy was not prescribed due to. No BM yet, last 6 days ago. No nausea and did eat yesterday. Knee high teds on, needs to have thigh hi on right. No bleeding except blood tinged urine in both PCNs. I have spoken with IR requesting that PAC be done while she is still in hospital if possible, that scheduled for 3-7 as outpatient.   Objective: Vital signs in last 24 hours: Blood pressure 131/54, pulse 91, temperature 98.4 F (36.9 C), temperature source Oral, resp. rate 16, height 5\' 6"  (1.676 m), weight 239 lb 6.7 oz (108.6 kg), last menstrual period 04/09/2011, SpO2 98.00%.  Output > intake by 1200cc, 4180cc and since MN 900cc Intake/Output from previous day: 02/28 0701 - 03/01 0700 In: 5142 [P.O.:718; I.V.:4424] Out: 9475 [Urine:9475] Intake/Output this shift: Total I/O In: 240 [P.O.:240] Out: -   Physical exam: alert, appears in pain from right back, pleasant and cooperative. Respirations not labored RA. Lungs without wheezes or rales. Cor RRR. Oral mucosa moist. Abdomen full, quiet, not tender. Somewhat bloody urine in PCNs, bilaterally, just emptied. LE no swelling left, 1+ right with knee hi teds. Feet warm.  Lab Results:  Basename 06/15/11 0525 06/14/11 0940  WBC 12.6* 11.0*  HGB 9.4* 8.7*  HCT 28.2* 25.9*  PLT 378 348  Increase in hgb likely due to diuresis BMET  Basename 06/15/11 0525 06/14/11 0515  NA 142 141  K 4.3 5.2*  CL 106 107  CO2 25 25  GLUCOSE 104* 91  BUN 25* 35*  CREATININE 2.74* 4.31*  CALCIUM 10.0 9.3   I have ordered another Bmet 3 pm today to follow electrolytes and creatinine, as is at least 6 liters  out>in total as above.  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 Plan palliative taxotere/gemzar outpatient when acute problems allow 2.acute nonoliguric renal failure due to ureteral obstruction: PCNs in place. Do not plan to internalize stents. She needs increased hydration as she is having post obstructive diuresis. Creatinine improving 3.inadequate peripheral IV access for chemo: PAC while still needs to be inpatient if possible 4.on prophylactic lovenox due to inactivity and metastatic cancer, recently also post op from gyn onc surgery. If this needs to be held for [facility] she will need continuous SCD instead. 5.constipation: increase miralax etc. 6.long tobacco just DCd  I will ask my partners to follow with you over weekend.  Thanks.   Jackie Cook P [phone]

## 2011-06-15 NOTE — Progress Notes (Signed)
PROGRESS NOTE  DELISA FINCK ZOX:[id number] DOB: 16-Mar-1961 DOA: 06/12/2011 PCP: No primary provider on file.  Brief narrative: Jackie Cook is an 51 y.o. female with recent diagnosis of metastatic leiomyosarcoma of uterus who presented to the ER upon the advice of Dr. Darrold Span, who she saw 06/12/11, after routine blood work revealed significant renal impairment. She had a known history of hydronephrosis related to uterine cancer, and it was suspected as the source of her deterioration in renal function.  Renal US showed bilateral hydronephrosis, and urology was consulted for consideration of percutaneous nephrostomy tube placement, which was done by IR on 06/13/11.  Consultants:  Dr. Tama Gander, Urology  Dr. Jama Flavors, Oncology  Dr. Maryclare Bean, Interventional radiology  Procedures:  Bilateral percutaneous nephrostomies 06/13/11   Antibiotics:  Cipro 06/13/11 x 1 dose   Subjective  Mrs. Kintz continues to have flank discomfort but it is controlled with Dilaudid.  She denies N/V.  No BM in several days. Now is requesting an enema.   Objective    Interim History: Stable over night.    Objective: Filed Vitals:   06/14/11 2145 06/15/11 0635 06/15/11 0700 06/15/11 1419  BP: 154/64 131/54  152/84  Pulse: 90 91  85  Temp: 98.7 F (37.1 C) 98.4 F (36.9 C)  98.1 F (36.7 C)  TempSrc: Oral Oral  Oral  Resp: 20 16  16   Height:      Weight:   108.6 kg (239 lb 6.7 oz)   SpO2: 98% 98%  100%    Intake/Output Summary (Last 24 hours) at 06/15/11 1420 Last data filed at 06/15/11 1400  Gross per 24 hour  Intake   7645 ml  Output   9325 ml  Net  -1680 ml    Exam: Gen:  NAD Cardiovascular:  RRR, No M/R/G Respiratory: Lungs CTAB Gastrointestinal: Abdomen soft, NT/ND with normal active bowel sounds. Extremities: RLE edema    Data Reviewed: Basic Metabolic Panel:  Lab 06/15/11 8119 06/14/11 0515 06/13/11 0505 06/12/11 0930  NA 142 141 139 144  K 4.3  5.2* -- --  CL 106 107 104 106  CO2 25 25 27 24   GLUCOSE 104* 91 96 109*  BUN 25* 35* 31* 30*  CREATININE 2.74* 4.31* 4.15* 3.76*  CALCIUM 10.0 9.3 9.5 9.5  MG -- -- -- --  PHOS -- -- -- --   Liver Function Tests:  Lab 06/12/11 0930  AST 11  ALT <8  ALKPHOS 64  BILITOT 0.4  PROT 6.6  ALBUMIN 3.4*   CBC:  Lab 06/15/11 0525 06/14/11 0940 06/14/11 0515 06/13/11 0505 06/12/11 0930  WBC 12.6* 11.0* 10.5 10.5 10.8*  NEUTROABS -- -- -- -- 8.1*  HGB 9.4* 8.7* 8.1* 7.7* 9.0*  HCT 28.2* 25.9* 24.2* 23.1* 26.5*  MCV 80.3 79.9 79.6 79.1 81.6  PLT 378 348 324 356 365     Recent Results (from the past 240 hour(s))  URINE CULTURE     Status: Normal   Collection Time   06/12/11 10:39 AM      Component Value Range Status Comment   Urine Culture, Routine Culture, Urine   Final      Studies:   US Renal 06/12/2011  IMPRESSION: 1.  Moderate right-sided pelvocaliectasis and ureterectasis of the superior aspect of the right ureter, likely unchanged compared to abdominal CT performed 05/07/2011 and possibly a chronic finding.  2.  Likely nonobstructing 1 cm right-sided renal stone.  3.  Mild fullness of the left sided  collecting system suggestive of mild hydronephrosis.  Original Report Authenticated By: Waynard Reeds, M.D.    Scheduled Meds:    . docusate sodium  200 mg Oral Daily  . enoxaparin (LOVENOX) injection  40 mg Subcutaneous Daily  . ferrous fumarate  106 mg of iron Oral Daily  . mulitivitamin with minerals  1 tablet Oral Daily  . polyethylene glycol  17 g Oral BID  . vancomycin  1,000 mg Intravenous Once  . DISCONTD: enoxaparin (LOVENOX) injection  30 mg Subcutaneous Q24H  . DISCONTD: polyethylene glycol powder  17 g Oral Daily   Continuous Infusions:    . dextrose 5 % and 0.9% NaCl 350 mL/hr at 06/15/11 1144  . DISCONTD: dextrose 5 % and 0.9% NaCl 125 mL/hr at 06/14/11 1640     Assessment/Plan: Assessment & Plan:  Principal Problem:  *ARF (acute renal failure)   Likely secondary to obstructive uropathy in the setting of her history of uterine cancer with bulky pelvic disease.  Renal US was done on 06/12/11 which showed bilateral hydronephrosis.   She was seen by the urologist who and with nephrostomy tube placement, which was done by IR on 06/13/11.  Renal function beginning to improve. Continue IVF. 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  Dr. Darrold Span has seen the patient.  She plans gemcitabine/ taxotere in palliative attempt when stable (outpatient).  PAC placement scheduled by IR on 06/21/11 as an outpatient, now re-scheduled for 06/18/11 as an inpatient. Hydronephrosis, bilateral Status post placement of bilateral percutaneous nephrostomy tubes.  Obesity (BMI 30-39.9)  Patient has had significant weight loss but remains obese with a BMI of 38.1. Seen by dietitian on 06/13/11.    Constipation  We will continue the patient on daily MiraLAX and Colace. Still no BM despite stool softeners.  Will give Dulcolax suppository and if this is ineffective, try a Fleets enema. Nausea  Continue anti-emetics when necessary.  Right leg edema  Likely from compressive lymphadenopathy. She apparently had a Doppler exam done which ruled out DVT within the past month.  Normocytic anemia The patient was given 1 unit of PRBCs on 06/13/11.  Likely from AOCD.  Continue iron.  Hemoglobin stable.   Code Status: Full code Family Communication: Orpah Greek [phone] Disposition Plan: Home when stable   LOS: 3 days   Hillery Aldo, MD Pager [phone]  06/15/2011, 2:20 PM

## 2011-06-15 NOTE — Progress Notes (Signed)
Subjective: bilat percutaneous nephrostomy tubes placed 2/27 Pt feels some better; soreness at rt site more than left  Objective: Vital signs in last 24 hours: Temp:  [98.4 F (36.9 C)-98.8 F (37.1 C)] 98.4 F (36.9 C) (03/01 0635) Pulse Rate:  [83-91] 91  (03/01 0635) Resp:  [16-20] 16  (03/01 0635) BP: (131-154)/(54-70) 131/54 mmHg (03/01 0635) SpO2:  [98 %-100 %] 98 % (03/01 0635) Weight:  [239 lb 6.7 oz (108.6 kg)] 239 lb 6.7 oz (108.6 kg) (03/01 0700) Last BM Date: 06/10/11  Intake/Output from previous day: 02/28 0701 - 03/01 0700 In: 5142 [P.O.:718; I.V.:4424] Out: 9475 [Urine:9475] Intake/Output this shift: Total I/O In: 240 [P.O.:240] Out: -   PE:  Afeb; VSS Up eating breakfast Output good both sides Rt: 1.6L 2/28; 300 cc in bag now-blood tinged Lt: 7.8L 2/28; 500 cc in bag now-blood tinged Sites are clean and dry Sl tender to touch; no bleeding; no sign of infection Bun/Cr: 25/2.7 (35/4.3)  Lab Results:   [facility] 06/15/11 0525 06/14/11 0940  WBC 12.6* 11.0*  HGB 9.4* 8.7*  HCT 28.2* 25.9*  PLT 378 348   BMET  Basename 06/15/11 0525 06/14/11 0515  NA 142 141  K 4.3 5.2*  CL 106 107  CO2 25 25  GLUCOSE 104* 91  BUN 25* 35*  CREATININE 2.74* 4.31*  CALCIUM 10.0 9.3   PT/INR  Basename 06/13/11 1030  LABPROT 14.7  INR 1.13   ABG No results found for this basename: PHART:2,PCO2:2,PO2:2,HCO3:2 in the last 72 hours  Studies/Results: Ir Perc Nephrostomy Left  06/14/2011  *RADIOLOGY REPORT*  Clinical Data/Indication: RENAL FAILURE.  URETERAL OBSTRUCTION.  PERC NEPHROSTOMY*R*,PERC NEPHROSTOMY*L*,IR ULTRASOUND GUIDANCE TISSUE ABLATION  Sedation: Versed 4 mg, Fentanyl 200 mg.  Total Moderate Sedation Time: 30 minutes.  Contrast Volume: 30 ml Omnipaque-300.  Additional Medications: Ciprofloxacin 400 mg IV.  Fluoroscopy Time: 1.9 minutes.  Procedure: The procedure, risks, benefits, and alternatives were explained to the patient. Questions regarding  the procedure were encouraged and answered. The patient understands and consents to the procedure.  The low back was prepped with betadine in a sterile fashion, and a sterile drape was applied covering the operative field. A sterile gown and sterile gloves were used for the procedure.  Under sonographic guidance, a 21 gauge needle was inserted into the lower pole calix of the left kidney.  Contrast was injected.  Was removed over a 0018 wire which was upsized to a three J.  10-French dilator followed by 10-French drain were inserted and coiled in the renal pelvis.  It was looped and string fixed.  Contrast was injected.  It was sewn to the skin.  The identical procedure was performed for the right kidney.  Findings: Bilateral lower pole renal collecting system access followed by bilateral percutaneous nephrostomy catheter placement into the renal pelvis bilaterally is documented.  Complications: None.  IMPRESSION: Bilateral percutaneous nephrostomy catheter placement.  Original Report Authenticated By: Donavan Burnet, M.D.   Ir Perc Nephrostomy Right  06/14/2011  *RADIOLOGY REPORT*  Clinical Data/Indication: RENAL FAILURE.  URETERAL OBSTRUCTION.  PERC NEPHROSTOMY*R*,PERC NEPHROSTOMY*L*,IR ULTRASOUND GUIDANCE TISSUE ABLATION  Sedation: Versed 4 mg, Fentanyl 200 mg.  Total Moderate Sedation Time: 30 minutes.  Contrast Volume: 30 ml Omnipaque-300.  Additional Medications: Ciprofloxacin 400 mg IV.  Fluoroscopy Time: 1.9 minutes.  Procedure: The procedure, risks, benefits, and alternatives were explained to the patient. Questions regarding the procedure were encouraged and answered. The patient understands and consents to the procedure.  The low back  was prepped with betadine in a sterile fashion, and a sterile drape was applied covering the operative field. A sterile gown and sterile gloves were used for the procedure.  Under sonographic guidance, a 21 gauge needle was inserted into the lower pole calix of the left  kidney.  Contrast was injected.  Was removed over a 0018 wire which was upsized to a three J.  10-French dilator followed by 10-French drain were inserted and coiled in the renal pelvis.  It was looped and string fixed.  Contrast was injected.  It was sewn to the skin.  The identical procedure was performed for the right kidney.  Findings: Bilateral lower pole renal collecting system access followed by bilateral percutaneous nephrostomy catheter placement into the renal pelvis bilaterally is documented.  Complications: None.  IMPRESSION: Bilateral percutaneous nephrostomy catheter placement.  Original Report Authenticated By: Donavan Burnet, M.D.   Ir US Guide Bx Asp/drain  06/14/2011  *RADIOLOGY REPORT*  Clinical Data/Indication: RENAL FAILURE.  URETERAL OBSTRUCTION.  PERC NEPHROSTOMY*R*,PERC NEPHROSTOMY*L*,IR ULTRASOUND GUIDANCE TISSUE ABLATION  Sedation: Versed 4 mg, Fentanyl 200 mg.  Total Moderate Sedation Time: 30 minutes.  Contrast Volume: 30 ml Omnipaque-300.  Additional Medications: Ciprofloxacin 400 mg IV.  Fluoroscopy Time: 1.9 minutes.  Procedure: The procedure, risks, benefits, and alternatives were explained to the patient. Questions regarding the procedure were encouraged and answered. The patient understands and consents to the procedure.  The low back was prepped with betadine in a sterile fashion, and a sterile drape was applied covering the operative field. A sterile gown and sterile gloves were used for the procedure.  Under sonographic guidance, a 21 gauge needle was inserted into the lower pole calix of the left kidney.  Contrast was injected.  Was removed over a 0018 wire which was upsized to a three J.  10-French dilator followed by 10-French drain were inserted and coiled in the renal pelvis.  It was looped and string fixed.  Contrast was injected.  It was sewn to the skin.  The identical procedure was performed for the right kidney.  Findings: Bilateral lower pole renal collecting system  access followed by bilateral percutaneous nephrostomy catheter placement into the renal pelvis bilaterally is documented.  Complications: None.  IMPRESSION: Bilateral percutaneous nephrostomy catheter placement.  Original Report Authenticated By: Donavan Burnet, M.D.   Ir US Guide Bx Asp/drain  06/14/2011  *RADIOLOGY REPORT*  Clinical Data/Indication: RENAL FAILURE.  URETERAL OBSTRUCTION.  PERC NEPHROSTOMY*R*,PERC NEPHROSTOMY*L*,IR ULTRASOUND GUIDANCE TISSUE ABLATION  Sedation: Versed 4 mg, Fentanyl 200 mg.  Total Moderate Sedation Time: 30 minutes.  Contrast Volume: 30 ml Omnipaque-300.  Additional Medications: Ciprofloxacin 400 mg IV.  Fluoroscopy Time: 1.9 minutes.  Procedure: The procedure, risks, benefits, and alternatives were explained to the patient. Questions regarding the procedure were encouraged and answered. The patient understands and consents to the procedure.  The low back was prepped with betadine in a sterile fashion, and a sterile drape was applied covering the operative field. A sterile gown and sterile gloves were used for the procedure.  Under sonographic guidance, a 21 gauge needle was inserted into the lower pole calix of the left kidney.  Contrast was injected.  Was removed over a 0018 wire which was upsized to a three J.  10-French dilator followed by 10-French drain were inserted and coiled in the renal pelvis.  It was looped and string fixed.  Contrast was injected.  It was sewn to the skin.  The identical procedure was performed for the right  kidney.  Findings: Bilateral lower pole renal collecting system access followed by bilateral percutaneous nephrostomy catheter placement into the renal pelvis bilaterally is documented.  Complications: None.  IMPRESSION: Bilateral percutaneous nephrostomy catheter placement.  Original Report Authenticated By: Donavan Burnet, M.D.    Anti-infectives: Anti-infectives     Start     Dose/Rate Route Frequency Ordered Stop   06/13/11 1100    ciprofloxacin (CIPRO) IVPB 400 mg     Comments: GIVE ON CALL TO XRAY FOR NEPHROSTOMIES TODAY(2/27)      400 mg 200 mL/hr over 60 Minutes Intravenous On call 06/13/11 1003 06/13/11 1729          Assessment/Plan: s/p Bilat PCNs placed 2/27 Intact; good output Bun/Cr coming down Will follow Plan per Dr Darrold Span Will place [facility] 3/4 in IR     Marcas Bowsher A 06/15/2011

## 2011-06-15 NOTE — H&P (Signed)
Jackie Cook is an 51 y.o. female.   Chief Complaint: Uterine Ca; Bilateral hydronephrosis- B PCNs placed 2/27  HPI: was scheduled as OP for [facility] placement but now is in hospital- MD would like IR to place [facility] while an inpatient Scheduled now for [facility] 3/4 in IR  Past Medical History  Diagnosis Date  . Anemia     Iron deficiency  . Blood transfusion     2001  . H/O hiatal hernia   . 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 H/O menorrhagia   . DDD (degenerative disc disease)     Past Surgical History  Procedure Date  . Total abdominal hysterectomy w/ bilateral salpingoophorectomy 05/22/11  . Laparotomy 05/22/2011    Procedure: EXPLORATORY LAPAROTOMY;  Surgeon: Laurette Schimke, MD PHD;  Location: [facility] ORS;  Service: Gynecology;  Laterality: N/A;  . Abdominal hysterectomy 05/22/2011    Procedure: HYSTERECTOMY ABDOMINAL;  Surgeon: Laurette Schimke, MD PHD;  Location: [facility];  Service: Gynecology;  Laterality: N/A;  with Resection of Pelvic Mass    . Salpingoophorectomy 05/22/2011    Procedure: SALPINGO OOPHERECTOMY;  Surgeon: Laurette Schimke, MD PHD;  Location: [facility];  Service: Gynecology;  Laterality: Bilateral;    Family History  Problem Relation Age of Onset  . Breast cancer Maternal Aunt   . Heart attack Other   . Lung cancer Father   .      Social History:  reports that she quit smoking about 3 weeks ago. She has never used smokeless tobacco. She reports that she does not drink alcohol or use illicit drugs.  Allergies:  Allergies  Allergen Reactions  . Aspirin     GI upset  . Morphine And Related Itching  . Penicillins Itching    Medications Prior to Admission  Medication Dose Route Frequency Provider Last Rate Last Dose  . 0.9 %  sodium chloride infusion   Intravenous Once Gavin Pound. Ghim, MD 75 mL/hr at 06/12/11 1834    . acetaminophen (TYLENOL) tablet 650 mg  650 mg Oral Q6H PRN Hillery Aldo, MD       Or  .  acetaminophen (TYLENOL) suppository 650 mg  650 mg Rectal Q6H PRN Hillery Aldo, MD      . acetaminophen (TYLENOL) tablet 325 mg  325 mg Oral Q6H PRN Lennis Buzzy Han, MD      . bisacodyl (DULCOLAX) suppository 10 mg  10 mg Rectal Daily PRN Lennis Buzzy Han, MD      . ciprofloxacin (CIPRO) IVPB 400 mg  400 mg Intravenous On Call D Jeananne Rama, PA   400 mg at 06/13/11 1629  . dextrose 5 %-0.9 % sodium chloride infusion   Intravenous Continuous Lennis Buzzy Han, MD 350 mL/hr at 06/15/11 0850    . diphenhydrAMINE (BENADRYL) injection 25 mg  25 mg Intravenous Q6H PRN Hillery Aldo, MD      . docusate sodium (COLACE) capsule 200 mg  200 mg Oral Daily Hillery Aldo, MD   200 mg at 06/14/11 1023  . enoxaparin (LOVENOX) injection 40 mg  40 mg Subcutaneous Daily Hillery Aldo, MD      . fentaNYL (SUBLIMAZE) injection   Intravenous PRN Art A Hoss, MD   100 mcg at 06/13/11 1649  . ferrous fumarate (HEMOCYTE - 106 mg FE) tablet 106 mg of iron  106 mg of iron Oral Daily Lennis P Livesay, MD   106 mg of iron at 06/14/11 1023  .  Glycerin (Adult) 2.1 G suppository 1 suppository  1 suppository Rectal Daily PRN Lennis Buzzy Han, MD      . HYDROmorphone (DILAUDID) injection 1 mg  1 mg Intravenous Q4H PRN Hillery Aldo, MD   1 mg at 06/15/11 0800  . iohexol (OMNIPAQUE) 300 MG/ML solution 20 mL  20 mL Intrathecal Once PRN Art A Hoss, MD      . midazolam (VERSED) 5 MG/5ML injection   Intravenous PRN Art A Hoss, MD   2 mg at 06/13/11 1628  . mulitivitamin with minerals tablet 1 tablet  1 tablet Oral Daily Hillery Aldo, MD   1 tablet at 06/14/11 1023  . nicotine (NICODERM CQ - dosed in mg/24 hours) patch 21 mg  21 mg Transdermal Daily PRN Lennis P Livesay, MD      . ondansetron (ZOFRAN) tablet 4 mg  4 mg Oral Q6H PRN Hillery Aldo, MD       Or  . ondansetron (ZOFRAN) injection 4 mg  4 mg Intravenous Q6H PRN Hillery Aldo, MD      . oxyCODONE-acetaminophen (PERCOCET) 5-325 MG per tablet 1 tablet  1 tablet Oral  Q4H PRN Hillery Aldo, MD   1 tablet at 06/13/11 1728  . polyethylene glycol (MIRALAX / GLYCOLAX) packet 17 g  17 g Oral BID Lennis Buzzy Han, MD      . sodium phosphate (FLEET) 7-19 GM/118ML enema 1 enema  1 enema Rectal Daily PRN Lennis Buzzy Han, MD      . vancomycin (VANCOCIN) IVPB 1000 mg/200 mL premix  1,000 mg Intravenous Once Robet Leu, PA      . zolpidem (AMBIEN) tablet 5 mg  5 mg Oral QHS PRN Hillery Aldo, MD   5 mg at 06/14/11 2126  . DISCONTD: 0.9 %  sodium chloride infusion   Intravenous Continuous Hillery Aldo, MD 50 mL/hr at 06/13/11 0503    . DISCONTD: 0.9 %  sodium chloride infusion   Intravenous Continuous Lennis P Livesay, MD 75 mL/hr at 06/13/11 0850 75 mL/hr at 06/13/11 0850  . DISCONTD: dextrose 5 %-0.9 % sodium chloride infusion   Intravenous Continuous Lennis P Livesay, MD 125 mL/hr at 06/14/11 1640    . DISCONTD: enoxaparin (LOVENOX) injection 30 mg  30 mg Subcutaneous Q24H Christina Rama, MD      . DISCONTD: enoxaparin (LOVENOX) injection 30 mg  30 mg Subcutaneous Q24H Randall K Absher, PHARMD   30 mg at 06/14/11 1024  . DISCONTD: enoxaparin (LOVENOX) injection 40 mg  40 mg Subcutaneous Q24H Lennis P Livesay, MD      . DISCONTD: loperamide (IMODIUM) capsule 4 mg  4 mg Oral Once Gavin Pound. Ghim, MD      . DISCONTD: ondansetron (ZOFRAN-ODT) disintegrating tablet 8 mg  8 mg Oral Once Gavin Pound. Ghim, MD      . DISCONTD: polyethylene glycol powder (GLYCOLAX/MIRALAX) container 17 g  17 g Oral Daily Hillery Aldo, MD   17 g at 06/14/11 1048   Medications Prior to Admission  Medication Sig Dispense Refill  . docusate sodium (COLACE) 100 MG capsule Take 200 mg by mouth daily.       Marland Kitchen enoxaparin (LOVENOX) 40 MG/0.4ML SOLN Inject 0.4 mLs (40 mg total) into the skin daily.  11.2 mL  1  . Multiple Vitamins-Minerals (MULTIVITAMIN PO) Take 1 tablet by mouth daily.        Results for orders placed during the hospital encounter of 06/12/11 (from the past 48 hour(s))    PROTIME-INR  Status: Normal   Collection Time   06/13/11 10:30 AM      Component Value Range Comment   Prothrombin Time 14.7  11.6 - 15.2 (seconds)    INR 1.13  0.00 - 1.49    APTT     Status: Abnormal   Collection Time   06/13/11 10:30 AM      Component Value Range Comment   aPTT 39 (*) 24 - 37 (seconds)   TYPE AND SCREEN     Status: Normal   Collection Time   06/13/11 11:40 AM      Component Value Range Comment   ABO/RH(D) O POS      Antibody Screen NEG      Sample Expiration 06/16/2011      Unit Number [id number]      Blood Component Type RED CELLS,LR      Unit division 00      Status of Unit ISSUED,FINAL      Transfusion Status OK TO TRANSFUSE      Crossmatch Result Compatible     BASIC METABOLIC PANEL     Status: Abnormal   Collection Time   06/14/11  5:15 AM      Component Value Range Comment   Sodium 141  135 - 145 (mEq/L)    Potassium 5.2 (*) 3.5 - 5.1 (mEq/L)    Chloride 107  96 - 112 (mEq/L)    CO2 25  19 - 32 (mEq/L)    Glucose, Bld 91  70 - 99 (mg/dL)    BUN 35 (*) 6 - 23 (mg/dL)    Creatinine, Ser 4.09 (*) 0.50 - 1.10 (mg/dL)    Calcium 9.3  8.4 - 10.5 (mg/dL)    GFR calc non Af Amer 11 (*) >90 (mL/min)    GFR calc Af Amer 13 (*) >90 (mL/min)   CBC     Status: Abnormal   Collection Time   06/14/11  5:15 AM      Component Value Range Comment   WBC 10.5  4.0 - 10.5 (K/uL)    RBC 3.04 (*) 3.87 - 5.11 (MIL/uL)    Hemoglobin 8.1 (*) 12.0 - 15.0 (g/dL)    HCT 81.1 (*) 91.4 - 46.0 (%)    MCV 79.6  78.0 - 100.0 (fL)    MCH 26.6  26.0 - 34.0 (pg)    MCHC 33.5  30.0 - 36.0 (g/dL)    RDW 78.2 (*) 95.6 - 15.5 (%)    Platelets 324  150 - 400 (K/uL)   CBC     Status: Abnormal   Collection Time   06/14/11  9:40 AM      Component Value Range Comment   WBC 11.0 (*) 4.0 - 10.5 (K/uL)    RBC 3.24 (*) 3.87 - 5.11 (MIL/uL)    Hemoglobin 8.7 (*) 12.0 - 15.0 (g/dL)    HCT 21.3 (*) 08.6 - 46.0 (%)    MCV 79.9  78.0 - 100.0 (fL)    MCH 26.9  26.0 - 34.0 (pg)    MCHC  33.6  30.0 - 36.0 (g/dL)    RDW 57.8  46.9 - 62.9 (%)    Platelets 348  150 - 400 (K/uL)   CBC     Status: Abnormal   Collection Time   06/15/11  5:25 AM      Component Value Range Comment   WBC 12.6 (*) 4.0 - 10.5 (K/uL)    RBC 3.51 (*) 3.87 - 5.11 (MIL/uL)  Hemoglobin 9.4 (*) 12.0 - 15.0 (g/dL)    HCT 16.1 (*) 09.6 - 46.0 (%)    MCV 80.3  78.0 - 100.0 (fL)    MCH 26.8  26.0 - 34.0 (pg)    MCHC 33.3  30.0 - 36.0 (g/dL)    RDW 04.5 (*) 40.9 - 15.5 (%)    Platelets 378  150 - 400 (K/uL)   BASIC METABOLIC PANEL     Status: Abnormal   Collection Time   06/15/11  5:25 AM      Component Value Range Comment   Sodium 142  135 - 145 (mEq/L)    Potassium 4.3  3.5 - 5.1 (mEq/L)    Chloride 106  96 - 112 (mEq/L)    CO2 25  19 - 32 (mEq/L)    Glucose, Bld 104 (*) 70 - 99 (mg/dL)    BUN 25 (*) 6 - 23 (mg/dL)    Creatinine, Ser 8.11 (*) 0.50 - 1.10 (mg/dL)    Calcium 91.4  8.4 - 10.5 (mg/dL)    GFR calc non Af Amer 19 (*) >90 (mL/min)    GFR calc Af Amer 22 (*) >90 (mL/min)    Ir Perc Nephrostomy Left  06/14/2011  *RADIOLOGY REPORT*  Clinical Data/Indication: RENAL FAILURE.  URETERAL OBSTRUCTION.  PERC NEPHROSTOMY*R*,PERC NEPHROSTOMY*L*,IR ULTRASOUND GUIDANCE TISSUE ABLATION  Sedation: Versed 4 mg, Fentanyl 200 mg.  Total Moderate Sedation Time: 30 minutes.  Contrast Volume: 30 ml Omnipaque-300.  Additional Medications: Ciprofloxacin 400 mg IV.  Fluoroscopy Time: 1.9 minutes.  Procedure: The procedure, risks, benefits, and alternatives were explained to the patient. Questions regarding the procedure were encouraged and answered. The patient understands and consents to the procedure.  The low back was prepped with betadine in a sterile fashion, and a sterile drape was applied covering the operative field. A sterile gown and sterile gloves were used for the procedure.  Under sonographic guidance, a 21 gauge needle was inserted into the lower pole calix of the left kidney.  Contrast was injected.  Was  removed over a 0018 wire which was upsized to a three J.  10-French dilator followed by 10-French drain were inserted and coiled in the renal pelvis.  It was looped and string fixed.  Contrast was injected.  It was sewn to the skin.  The identical procedure was performed for the right kidney.  Findings: Bilateral lower pole renal collecting system access followed by bilateral percutaneous nephrostomy catheter placement into the renal pelvis bilaterally is documented.  Complications: None.  IMPRESSION: Bilateral percutaneous nephrostomy catheter placement.  Original Report Authenticated By: Donavan Burnet, M.D.   Ir Perc Nephrostomy Right  06/14/2011  *RADIOLOGY REPORT*  Clinical Data/Indication: RENAL FAILURE.  URETERAL OBSTRUCTION.  PERC NEPHROSTOMY*R*,PERC NEPHROSTOMY*L*,IR ULTRASOUND GUIDANCE TISSUE ABLATION  Sedation: Versed 4 mg, Fentanyl 200 mg.  Total Moderate Sedation Time: 30 minutes.  Contrast Volume: 30 ml Omnipaque-300.  Additional Medications: Ciprofloxacin 400 mg IV.  Fluoroscopy Time: 1.9 minutes.  Procedure: The procedure, risks, benefits, and alternatives were explained to the patient. Questions regarding the procedure were encouraged and answered. The patient understands and consents to the procedure.  The low back was prepped with betadine in a sterile fashion, and a sterile drape was applied covering the operative field. A sterile gown and sterile gloves were used for the procedure.  Under sonographic guidance, a 21 gauge needle was inserted into the lower pole calix of the left kidney.  Contrast was injected.  Was removed over a 0018 wire which  was upsized to a three J.  10-French dilator followed by 10-French drain were inserted and coiled in the renal pelvis.  It was looped and string fixed.  Contrast was injected.  It was sewn to the skin.  The identical procedure was performed for the right kidney.  Findings: Bilateral lower pole renal collecting system access followed by bilateral  percutaneous nephrostomy catheter placement into the renal pelvis bilaterally is documented.  Complications: None.  IMPRESSION: Bilateral percutaneous nephrostomy catheter placement.  Original Report Authenticated By: Donavan Burnet, M.D.   Ir US Guide Bx Asp/drain  06/14/2011  *RADIOLOGY REPORT*  Clinical Data/Indication: RENAL FAILURE.  URETERAL OBSTRUCTION.  PERC NEPHROSTOMY*R*,PERC NEPHROSTOMY*L*,IR ULTRASOUND GUIDANCE TISSUE ABLATION  Sedation: Versed 4 mg, Fentanyl 200 mg.  Total Moderate Sedation Time: 30 minutes.  Contrast Volume: 30 ml Omnipaque-300.  Additional Medications: Ciprofloxacin 400 mg IV.  Fluoroscopy Time: 1.9 minutes.  Procedure: The procedure, risks, benefits, and alternatives were explained to the patient. Questions regarding the procedure were encouraged and answered. The patient understands and consents to the procedure.  The low back was prepped with betadine in a sterile fashion, and a sterile drape was applied covering the operative field. A sterile gown and sterile gloves were used for the procedure.  Under sonographic guidance, a 21 gauge needle was inserted into the lower pole calix of the left kidney.  Contrast was injected.  Was removed over a 0018 wire which was upsized to a three J.  10-French dilator followed by 10-French drain were inserted and coiled in the renal pelvis.  It was looped and string fixed.  Contrast was injected.  It was sewn to the skin.  The identical procedure was performed for the right kidney.  Findings: Bilateral lower pole renal collecting system access followed by bilateral percutaneous nephrostomy catheter placement into the renal pelvis bilaterally is documented.  Complications: None.  IMPRESSION: Bilateral percutaneous nephrostomy catheter placement.  Original Report Authenticated By: Donavan Burnet, M.D.   Ir US Guide Bx Asp/drain  06/14/2011  *RADIOLOGY REPORT*  Clinical Data/Indication: RENAL FAILURE.  URETERAL OBSTRUCTION.  PERC  NEPHROSTOMY*R*,PERC NEPHROSTOMY*L*,IR ULTRASOUND GUIDANCE TISSUE ABLATION  Sedation: Versed 4 mg, Fentanyl 200 mg.  Total Moderate Sedation Time: 30 minutes.  Contrast Volume: 30 ml Omnipaque-300.  Additional Medications: Ciprofloxacin 400 mg IV.  Fluoroscopy Time: 1.9 minutes.  Procedure: The procedure, risks, benefits, and alternatives were explained to the patient. Questions regarding the procedure were encouraged and answered. The patient understands and consents to the procedure.  The low back was prepped with betadine in a sterile fashion, and a sterile drape was applied covering the operative field. A sterile gown and sterile gloves were used for the procedure.  Under sonographic guidance, a 21 gauge needle was inserted into the lower pole calix of the left kidney.  Contrast was injected.  Was removed over a 0018 wire which was upsized to a three J.  10-French dilator followed by 10-French drain were inserted and coiled in the renal pelvis.  It was looped and string fixed.  Contrast was injected.  It was sewn to the skin.  The identical procedure was performed for the right kidney.  Findings: Bilateral lower pole renal collecting system access followed by bilateral percutaneous nephrostomy catheter placement into the renal pelvis bilaterally is documented.  Complications: None.  IMPRESSION: Bilateral percutaneous nephrostomy catheter placement.  Original Report Authenticated By: Donavan Burnet, M.D.    Review of Systems  Constitutional: Negative for fever.  Respiratory: Negative for cough.  Cardiovascular: Negative for chest pain.  Gastrointestinal: Negative for nausea and vomiting.    Blood pressure 131/54, pulse 91, temperature 98.4 F (36.9 C), temperature source Oral, resp. rate 16, height 5\' 6"  (1.676 m), weight 239 lb 6.7 oz (108.6 kg), last menstrual period 04/09/2011, SpO2 98.00%. Physical Exam  Constitutional: She is oriented to person, place, and time.  Cardiovascular: Normal rate.     No murmur heard. Respiratory: Effort normal. She has no wheezes.  GI: Soft. There is no tenderness.       Bilat PCNs intact  Musculoskeletal: Normal range of motion.  Neurological: She is alert and oriented to person, place, and time.  Skin: Skin is warm.     Assessment/Plan Ut Ca Bilat hydro-B PCNS in place Scheduled now for [facility] placement in IR 3/4 Pt aware of procedure benefits and risks and agreeable to proceed. Consent signed and in chart.  Blessed Girdner A 06/15/2011, 9:36 AM

## 2011-06-16 LAB — BASIC METABOLIC PANEL
Chloride: 112 mEq/L (ref 96–112)
Creatinine, Ser: 1.74 mg/dL — ABNORMAL HIGH (ref 0.50–1.10)
GFR calc Af Amer: 38 mL/min — ABNORMAL LOW (ref >90)

## 2011-06-16 LAB — CBC
MCV: 80.8 fL (ref 78.0–100.0)
Platelets: 319 10*3/uL (ref 150–400)
RDW: 15.9 % — ABNORMAL HIGH (ref 11.5–15.5)
WBC: 11.5 10*3/uL — ABNORMAL HIGH (ref 4.0–10.5)

## 2011-06-16 MED ORDER — POLYETHYLENE GLYCOL 3350 17 GM/SCOOP PO POWD
1.0000 | Freq: Once | ORAL | Status: AC
  Administered 2011-06-16: 1 via ORAL
  Filled 2011-06-16: qty 255

## 2011-06-16 MED ORDER — POLYETHYLENE GLYCOL 3350 17 GM/SCOOP PO POWD: 1.0000 | Freq: Once | ORAL | Status: DC

## 2011-06-16 NOTE — Progress Notes (Signed)
Subjective: The patient is seen and examined. She has no significant complaints today except for mild pain at the nephrostomy tube site. Her serum creatinine is better.  Objective: Vital signs in last 24 hours: Temp:  [98.1 F (36.7 C)-98.8 F (37.1 C)] 98.8 F (37.1 C) (03/02 0547) Pulse Rate:  [82-85] 82  (03/02 0547) Resp:  [16-18] 18  (03/02 0547) BP: (138-152)/(55-84) 138/55 mmHg (03/02 0547) SpO2:  [98 %-100 %] 100 % (03/02 0547) Weight:  [260 lb 9.3 oz (118.2 kg)] 260 lb 9.3 oz (118.2 kg) (03/02 0440)  Intake/Output from previous day: 03/01 0701 - 03/02 0700 In: 11920.8 [P.O.:2360; I.V.:9560.8] Out: 6050 [Urine:6050] Intake/Output this shift:    General appearance: alert, cooperative and no distress Resp: clear to auscultation bilaterally Cardio: regular rate and rhythm, S1, S2 normal, no murmur, click, rub or gallop GI: soft, non-tender; bowel sounds normal; no masses,  no organomegaly Extremities: 2+ edema of lower extremities bilaterally  Lab Results:   [facility] 06/16/11 0557 06/15/11 0525  WBC 11.5* 12.6*  HGB 8.1* 9.4*  HCT 24.4* 28.2*  PLT 319 378   BMET  Basename 06/16/11 0557 06/15/11 1520  NA 143 141  K 4.2 3.8  CL 112 107  CO2 25 24  GLUCOSE 122* 133*  BUN 13 19  CREATININE 1.74* 2.17*  CALCIUM 8.8 9.2    Studies/Results: No results found.  Medications: I have reviewed the patient's current medications.  Assessment/Plan: #1 metastatic leiomyosarcoma of the uterus: Expected to start treatment with Taxotere and Gemzar an outpatient basis after improvement in her renal function. #2 renal insufficiency: Secondary to ureteral obstruction from metastatic disease. She status post bilateral percutaneous nephrostomy catheter placement. Serum creatinine is improving.    LOS: 4 days    Jackie Lakatos K. 06/16/2011

## 2011-06-16 NOTE — Progress Notes (Addendum)
PROGRESS NOTE  Jackie Cook WUJ:[id number] DOB: February 28, 1961 DOA: 06/12/2011 PCP: No primary provider on file.  Brief narrative: Jackie Cook is an 51 y.o. female with recent diagnosis of metastatic leiomyosarcoma of uterus who presented to the ER upon the advice of Dr. Darrold Span, who she saw 06/12/11, after routine blood work revealed significant renal impairment. She had a known history of hydronephrosis related to uterine cancer, and it was suspected as the source of her deterioration in renal function.  Renal US showed bilateral hydronephrosis, and urology was consulted for consideration of percutaneous nephrostomy tube placement, which was done by IR on 06/13/11.  Consultants:  Dr. Tama Gander, Urology  Dr. Jama Flavors, Oncology  Dr. Maryclare Bean, Interventional radiology  Procedures:  Bilateral percutaneous nephrostomies 06/13/11   Antibiotics:  Cipro 06/13/11 x 1 dose   Subjective  Jackie Cook complains of back pain exacerbated by straining at her stool.  No nausea or vomiting.  No significant results from dulcolax suppository and fleets.   Objective    Interim History: Stable over night.    Objective: Filed Vitals:   06/15/11 2106 06/16/11 0440 06/16/11 0547 06/16/11 1358  BP: 150/74  138/55 148/69  Pulse: 83  82 83  Temp: 98.3 F (36.8 C)  98.8 F (37.1 C) 97.5 F (36.4 C)  TempSrc: Oral  Oral Oral  Resp: 18  18 18   Height:      Weight:  118.2 kg (260 lb 9.3 oz)    SpO2: 98%  100% 100%    Intake/Output Summary (Last 24 hours) at 06/16/11 1653 Last data filed at 06/16/11 1555  Gross per 24 hour  Intake 5680.83 ml  Output   6000 ml  Net -319.17 ml    Exam: Gen:  NAD Cardiovascular:  RRR, No M/R/G Respiratory: Lungs CTAB Gastrointestinal: Abdomen soft, NT/ND with normal active bowel sounds. Extremities: RLE edema    Data Reviewed: Basic Metabolic Panel:  Lab 06/16/11 9562 06/15/11 1520 06/15/11 0525 06/14/11 0515 06/13/11 0505    NA 143 141 142 141 139  K 4.2 3.8 -- -- --  CL 112 107 106 107 104  CO2 25 24 25 25 27   GLUCOSE 122* 133* 104* 91 96  BUN 13 19 25* 35* 31*  CREATININE 1.74* 2.17* 2.74* 4.31* 4.15*  CALCIUM 8.8 9.2 10.0 9.3 9.5  MG -- -- -- -- --  PHOS -- -- -- -- --   Liver Function Tests:  Lab 06/12/11 0930  AST 11  ALT <8  ALKPHOS 64  BILITOT 0.4  PROT 6.6  ALBUMIN 3.4*   CBC:  Lab 06/16/11 0557 06/15/11 0525 06/14/11 0940 06/14/11 0515 06/13/11 0505  WBC 11.5* 12.6* 11.0* 10.5 10.5  NEUTROABS -- -- -- -- --  HGB 8.1* 9.4* 8.7* 8.1* 7.7*  HCT 24.4* 28.2* 25.9* 24.2* 23.1*  MCV 80.8 80.3 79.9 79.6 79.1  PLT 319 378 348 324 356     Recent Results (from the past 240 hour(s))  URINE CULTURE     Status: Normal   Collection Time   06/12/11 10:39 AM      Component Value Range Status Comment   Urine Culture, Routine Culture, Urine   Final   URINE CULTURE     Status: Normal   Collection Time   06/14/11  1:49 PM      Component Value Range Status Comment   Specimen Description URINE, RANDOM   Final    Special Requests Normal   Final    Culture  Setup Time [id number]   Final    Colony Count NO GROWTH   Final    Culture NO GROWTH   Final    Report Status 06/15/2011 FINAL   Final   URINE CULTURE     Status: Normal   Collection Time   06/14/11  1:49 PM      Component Value Range Status Comment   Specimen Description URINE, RANDOM   Final    Special Requests NONE   Final    Culture  Setup Time [id number]   Final    Colony Count NO GROWTH   Final    Culture NO GROWTH   Final    Report Status 06/15/2011 FINAL   Final      Studies:   US Renal 06/12/2011  IMPRESSION: 1.  Moderate right-sided pelvocaliectasis and ureterectasis of the superior aspect of the right ureter, likely unchanged compared to abdominal CT performed 05/07/2011 and possibly a chronic finding.  2.  Likely nonobstructing 1 cm right-sided renal stone.  3.  Mild fullness of the left sided collecting system suggestive  of mild hydronephrosis.  Original Report Authenticated By: Waynard Reeds, M.D.    Scheduled Meds:    . docusate sodium  200 mg Oral Daily  . enoxaparin (LOVENOX) injection  40 mg Subcutaneous Daily  . ferrous fumarate  106 mg of iron Oral Daily  . mulitivitamin with minerals  1 tablet Oral Daily  . polyethylene glycol  17 g Oral BID  . vancomycin  1,000 mg Intravenous Once   Continuous Infusions:    . dextrose 5 % and 0.9% NaCl 350 mL/hr at 06/16/11 0700     Assessment/Plan: Assessment & Plan:  Principal Problem:  *ARF (acute renal failure)  Likely secondary to obstructive uropathy in the setting of her history of uterine cancer with bulky pelvic disease.  Renal US was done on 06/12/11 which showed bilateral hydronephrosis.   She was seen by the urologist who and with nephrostomy tube placement, which was done by IR on 06/13/11.  Renal function improving with high volume IVF.  Continue IVF. 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  Dr. Darrold Span has seen the patient.  She plans gemcitabine/ taxotere in palliative attempt when stable (outpatient).  PAC placement scheduled by IR on 06/21/11 as an outpatient, now re-scheduled for 06/18/11 as an inpatient. Hydronephrosis, bilateral Status post placement of bilateral percutaneous nephrostomy tubes.  Obesity (BMI 30-39.9)  Patient has had significant weight loss but remains obese with a BMI of 38.1. Seen by dietitian on 06/13/11.    Constipation  We will continue the patient on daily MiraLAX and Colace. Still no BM despite stool softeners.  We gave Dulcolax suppository and a Fleets enema 06/15/11 with limited results.  Will do MiraLAX by bowel prep until her bowels move. Nausea  Continue anti-emetics when necessary.  Right leg edema  Likely from compressive lymphadenopathy. She apparently had a Doppler  exam done which ruled out DVT within the past month.  Normocytic anemia The patient was given 1 unit of PRBCs on 06/13/11.  Likely from AOCD.  Continue iron.  Hemoglobin stable.   Code Status: Full code Family Communication: Orpah Greek [phone] Disposition Plan: Home when stable   LOS: 4 days   Hillery Aldo, MD Pager [phone]  06/16/2011, 4:53 PM

## 2011-06-17 DIAGNOSIS — D62 Acute posthemorrhagic anemia: Secondary | ICD-10-CM

## 2011-06-17 DIAGNOSIS — N1330 Unspecified hydronephrosis: Secondary | ICD-10-CM

## 2011-06-17 LAB — CBC
HCT: 22.2 % — ABNORMAL LOW (ref 36.0–46.0)
Platelets: 291 10*3/uL (ref 150–400)
RDW: 15.9 % — ABNORMAL HIGH (ref 11.5–15.5)
WBC: 10.7 10*3/uL — ABNORMAL HIGH (ref 4.0–10.5)

## 2011-06-17 LAB — BASIC METABOLIC PANEL
BUN: 9 mg/dL (ref 6–23)
Chloride: 115 mEq/L — ABNORMAL HIGH (ref 96–112)
GFR calc Af Amer: 50 mL/min — ABNORMAL LOW (ref >90)
Potassium: 4 mEq/L (ref 3.5–5.1)
Sodium: 143 mEq/L (ref 135–145)

## 2011-06-17 MED ORDER — HYDROMORPHONE HCL PF 2 MG/ML IJ SOLN
2.0000 mg | INTRAMUSCULAR | Status: DC | PRN
  Administered 2011-06-17 – 2011-06-19 (×9): 2 mg via INTRAVENOUS
  Filled 2011-06-17 (×9): qty 1

## 2011-06-17 NOTE — Progress Notes (Signed)
Progress Note:  Subjective: Still with pelvic pain now one month post debulking surgery. Renal function continues to improve pose bilateral percutaneous nephrostomies. I/O: 5625/4550 fluids at 350 ml/hour: I will decrease to 150 Hemoglobin drifting down:  9.4 to 7.4 today I will transfuse 1 unit packed cells; discussed w patient who consents.    Vitals: Filed Vitals:   06/17/11 0628  BP: 164/73  Pulse: 113  Temp: 98.3 F (36.8 C)  Resp: 18   Wt Readings from Last 3 Encounters:  06/17/11 238 lb 8.6 oz (108.2 kg)  06/12/11 238 lb 11.2 oz (108.274 kg)  05/31/11 232 lb 1.6 oz (105.28 kg)     PHYSICAL EXAM:  General alert, oriented Head:normal Eyes:normal Throat:no exudate Neck: Lymph Nodes: Lungs:clear Breasts:  Cardiac:regular no murmur Abdominal: soft; well healed vertical incision; non-tender at present Extremities: no edema; no calf tenderness Vascular:  No cyanosis Neurologic:non focal Skin:   Labs:   Basename 06/17/11 0545 06/16/11 0557  WBC 10.7* 11.5*  HGB 7.4* 8.1*  HCT 22.2* 24.4*  PLT 291 319    Basename 06/17/11 0545 06/16/11 0557  NA 143 143  K 4.0 4.2  CL 115* 112  CO2 24 25  GLUCOSE 113* 122*  BUN 9 13  CREATININE 1.37* 1.74*  CALCIUM 8.3* 8.8      Images Studies/Results:   No results found.   Patient Active Problem List  Diagnoses  . Pelvic mass  . Anemia due to blood loss, acute  . Malignant neoplasm of body of uterus  . Leiomyosarcoma of uterus  . ARF (acute renal failure)  . Bilateral hydronephrosis  . Obesity (BMI 30-39.9)  . Constipation  . Nausea  . Leg edema, right  . Normocytic anemia    Assessment and Plan:  #1. 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 Obstructive uropathy due to #1. #3. ARF due to 2: improving post nephrostomies;  #4. Anemia due to blood loss and renal failure: transfuse today #5. Lack of vascular acess: for porta-cath 3/4.  I will hold lovenox #6. Post ATN diuresis  - should be able to decrease IV fluids at this time. Thanks     Kiondra Caicedo M 06/17/2011, 10:01 AM

## 2011-06-17 NOTE — Progress Notes (Signed)
Dr. Darnelle Catalan, Ms. Shorb is having a port placed 06/18/11; would you like for [place] to hold her lovenox? Thanks

## 2011-06-17 NOTE — Progress Notes (Signed)
PROGRESS NOTE  Jackie Cook YNW:[id number] DOB: 03/31/1961 DOA: 06/12/2011 PCP: No primary provider on file.  Brief narrative: Jackie Cook is an 51 y.o. female with recent diagnosis of metastatic leiomyosarcoma of uterus who presented to the ER upon the advice of Dr. Darrold Span, who she saw 06/12/11, after routine blood work revealed significant renal impairment. She had a known history of hydronephrosis related to uterine cancer, and it was suspected as the source of her deterioration in renal function.  Renal US showed bilateral hydronephrosis, and urology was consulted for consideration of percutaneous nephrostomy tube placement, which was done by IR on 06/13/11.  Consultants:  Dr. Tama Gander, Urology  Dr. Jama Flavors, Oncology  Dr. Maryclare Bean, Interventional radiology  Procedures:  Bilateral percutaneous nephrostomies 06/13/11   Antibiotics:  Cipro 06/13/11 x 1 dose   Subjective  Jackie Cook finally had a bowel movement after a gallon of MiraLAX.  She had one episode of vomiting yesterday.  Some flank discomfort.   Objective    Interim History: Stable over night.    Objective: Filed Vitals:   06/16/11 0547 06/16/11 1358 06/16/11 2144 06/17/11 0628  BP: 138/55 148/69 140/62 164/73  Pulse: 82 83 82 113  Temp: 98.8 F (37.1 C) 97.5 F (36.4 C) 98.9 F (37.2 C) 98.3 F (36.8 C)  TempSrc: Oral Oral Oral Oral  Resp: 18 18 18 18   Height:      Weight:    108.2 kg (238 lb 8.6 oz)  SpO2: 100% 100% 99% 96%    Intake/Output Summary (Last 24 hours) at 06/17/11 1330 Last data filed at 06/17/11 1113  Gross per 24 hour  Intake   5625 ml  Output   3950 ml  Net   1675 ml    Exam: Gen:  NAD Cardiovascular:  RRR, No M/R/G Respiratory: Lungs CTAB Gastrointestinal: Abdomen soft, NT/ND with normal active bowel sounds. Extremities: RLE edema    Data Reviewed: Basic Metabolic Panel:  Lab 06/17/11 6578 06/16/11 0557 06/15/11 1520 06/15/11 0525 06/14/11  0515  NA 143 143 141 142 141  K 4.0 4.2 -- -- --  CL 115* 112 107 106 107  CO2 24 25 24 25 25   GLUCOSE 113* 122* 133* 104* 91  BUN 9 13 19  25* 35*  CREATININE 1.37* 1.74* 2.17* 2.74* 4.31*  CALCIUM 8.3* 8.8 9.2 10.0 9.3  MG -- -- -- -- --  PHOS -- -- -- -- --   Liver Function Tests:  Lab 06/12/11 0930  AST 11  ALT <8  ALKPHOS 64  BILITOT 0.4  PROT 6.6  ALBUMIN 3.4*   CBC:  Lab 06/17/11 0545 06/16/11 0557 06/15/11 0525 06/14/11 0940 06/14/11 0515  WBC 10.7* 11.5* 12.6* 11.0* 10.5  NEUTROABS -- -- -- -- --  HGB 7.4* 8.1* 9.4* 8.7* 8.1*  HCT 22.2* 24.4* 28.2* 25.9* 24.2*  MCV 81.3 80.8 80.3 79.9 79.6  PLT 291 319 378 348 324     Recent Results (from the past 240 hour(s))  URINE CULTURE     Status: Normal   Collection Time   06/12/11 10:39 AM      Component Value Range Status Comment   Urine Culture, Routine Culture, Urine   Final   URINE CULTURE     Status: Normal   Collection Time   06/14/11  1:49 PM      Component Value Range Status Comment   Specimen Description URINE, RANDOM   Final    Special Requests Normal   Final  Culture  Setup Time [id number]   Final    Colony Count NO GROWTH   Final    Culture NO GROWTH   Final    Report Status 06/15/2011 FINAL   Final   URINE CULTURE     Status: Normal   Collection Time   06/14/11  1:49 PM      Component Value Range Status Comment   Specimen Description URINE, RANDOM   Final    Special Requests NONE   Final    Culture  Setup Time [id number]   Final    Colony Count NO GROWTH   Final    Culture NO GROWTH   Final    Report Status 06/15/2011 FINAL   Final      Studies:   US Renal 06/12/2011  IMPRESSION: 1.  Moderate right-sided pelvocaliectasis and ureterectasis of the superior aspect of the right ureter, likely unchanged compared to abdominal CT performed 05/07/2011 and possibly a chronic finding.  2.  Likely nonobstructing 1 cm right-sided renal stone.  3.  Mild fullness of the left sided collecting system  suggestive of mild hydronephrosis.  Original Report Authenticated By: Waynard Reeds, M.D.    Scheduled Meds:    . docusate sodium  200 mg Oral Daily  . ferrous fumarate  106 mg of iron Oral Daily  . mulitivitamin with minerals  1 tablet Oral Daily  . polyethylene glycol  17 g Oral BID  . polyethylene glycol powder  1 Container Oral Once  . vancomycin  1,000 mg Intravenous Once  . DISCONTD: enoxaparin (LOVENOX) injection  40 mg Subcutaneous Daily  . DISCONTD: polyethylene glycol powder  1 Container Oral Once   Continuous Infusions:    . dextrose 5 % and 0.9% NaCl 150 mL/hr at 06/17/11 7846     Assessment/Plan: Assessment & Plan:  Principal Problem:  *ARF (acute renal failure)  Likely secondary to obstructive uropathy in the setting of her history of uterine cancer with bulky pelvic disease.  Renal US was done on 06/12/11 which showed bilateral hydronephrosis.   She was seen by the urologist who and with nephrostomy tube placement, which was done by IR on 06/13/11.  Renal function improving with high volume IVF.  Continue IVF but at a lower rate. 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  Dr. Darrold Span has seen the patient.  She plans gemcitabine/ taxotere in palliative attempt when stable (outpatient).  PAC placement scheduled by IR on 06/21/11 as an outpatient, now re-scheduled for 06/18/11 as an inpatient. Hydronephrosis, bilateral Status post placement of bilateral percutaneous nephrostomy tubes.  Obesity (BMI 30-39.9)  Patient has had significant weight loss but remains obese with a BMI of 38.1. Seen by dietitian on 06/13/11.    Constipation  We will continue the patient on daily MiraLAX and Colace.  Nausea  Continue anti-emetics when necessary.  Right leg edema  Likely from compressive lymphadenopathy. She apparently had a Doppler exam done  which ruled out DVT within the past month.  Normocytic anemia The patient was given 1 unit of PRBCs on 06/13/11 and is scheduled for another unit today.  Likely from AOCD.  Continue iron.     Code Status: Full code Family Communication: Jackie Cook [phone] Disposition Plan: Home when stable   LOS: 5 days   Hillery Aldo, MD Pager [phone]  06/17/2011, 1:30 PM

## 2011-06-18 ENCOUNTER — Inpatient Hospital Stay (HOSPITAL_COMMUNITY): Payer: Medicaid Other

## 2011-06-18 HISTORY — PX: PORTACATH PLACEMENT: SHX2246

## 2011-06-18 LAB — TYPE AND SCREEN
Antibody Screen: NEGATIVE
Unit division: 0

## 2011-06-18 LAB — CBC
HCT: 26.3 % — ABNORMAL LOW (ref 36.0–46.0)
Hemoglobin: 8.7 g/dL — ABNORMAL LOW (ref 12.0–15.0)
RBC: 3.23 MIL/uL — ABNORMAL LOW (ref 3.87–5.11)
WBC: 10.8 10*3/uL — ABNORMAL HIGH (ref 4.0–10.5)

## 2011-06-18 LAB — BASIC METABOLIC PANEL
BUN: 8 mg/dL (ref 6–23)
CO2: 24 mEq/L (ref 19–32)
Chloride: 113 mEq/L — ABNORMAL HIGH (ref 96–112)
Creatinine, Ser: 1.35 mg/dL — ABNORMAL HIGH (ref 0.50–1.10)
Potassium: 4 mEq/L (ref 3.5–5.1)

## 2011-06-18 MED ORDER — MIDAZOLAM HCL 5 MG/5ML IJ SOLN
INTRAMUSCULAR | Status: AC | PRN
  Administered 2011-06-18: 2 mg via INTRAVENOUS

## 2011-06-18 MED ORDER — FENTANYL CITRATE 0.05 MG/ML IJ SOLN
INTRAMUSCULAR | Status: AC | PRN
  Administered 2011-06-18: 100 ug via INTRAVENOUS
  Administered 2011-06-18: 50 ug via INTRAVENOUS

## 2011-06-18 MED ORDER — SENNOSIDES-DOCUSATE SODIUM 8.6-50 MG PO TABS
2.0000 | ORAL_TABLET | Freq: Three times a day (TID) | ORAL | Status: DC
  Administered 2011-06-18 – 2011-06-19 (×2): 2 via ORAL
  Filled 2011-06-18 (×6): qty 2

## 2011-06-18 MED ORDER — HEPARIN SOD (PORK) LOCK FLUSH 100 UNIT/ML IV SOLN: 500.0000 [IU] | Freq: Once | INTRAVENOUS | Status: DC

## 2011-06-18 MED ORDER — VANCOMYCIN HCL 500 MG IV SOLR
500.0000 mg | Freq: Once | INTRAVENOUS | Status: AC
  Administered 2011-06-18: 500 mg via INTRAVENOUS
  Filled 2011-06-18: qty 500

## 2011-06-18 NOTE — Progress Notes (Signed)
  Subjective: Patient c/o back pain; awaiting port a cath placement today  Objective: Vital signs in last 24 hours: Temp:  [98.2 F (36.8 C)-98.7 F (37.1 C)] 98.7 F (37.1 C) (03/04 0709) Pulse Rate:  [76-92] 89  (03/04 0709) Resp:  [14-20] 18  (03/04 0709) BP: (149-161)/(71-89) 158/76 mmHg (03/04 0709) SpO2:  [96 %-100 %] 100 % (03/04 0709) Weight:  [238 lb 8.6 oz (108.2 kg)] 238 lb 8.6 oz (108.2 kg) (03/04 0709) Last BM Date: 06/17/11  Intake/Output from previous day: 03/03 0701 - 03/04 0700 In: 1075 [I.V.:250; Blood:350] Out: 2700 [Urine:2700] Intake/Output this shift: Total I/O In: -  Out: 1050 [Urine:1050]  Bilateral PCN'S intact, insertion sites with mild erythema , mildly tender to palpation, outputs 525 cc's each today of yellow urine  Lab Results:   Basename 06/18/11 0450 06/17/11 0545  WBC 10.8* 10.7*  HGB 8.7* 7.4*  HCT 26.3* 22.2*  PLT 294 291   BMET  Basename 06/18/11 0450 06/17/11 0545  NA 141 143  K 4.0 4.0  CL 113* 115*  CO2 24 24  GLUCOSE 94 113*  BUN 8 9  CREATININE 1.35* 1.37*  CALCIUM 8.8 8.3*   PT/INR No results found for this basename: LABPROT:2,INR:2 in the last 72 hours ABG No results found for this basename: PHART:2,PCO2:2,PO2:2,HCO3:2 in the last 72 hours  Studies/Results: No results found. Results for orders placed during the hospital encounter of 06/12/11  URINE CULTURE     Status: Normal   Collection Time   06/14/11  1:49 PM      Component Value Range Status Comment   Specimen Description URINE, RANDOM   Final    Special Requests Normal   Final    Culture  Setup Time [id number]   Final    Colony Count NO GROWTH   Final    Culture NO GROWTH   Final    Report Status 06/15/2011 FINAL   Final   URINE CULTURE     Status: Normal   Collection Time   06/14/11  1:49 PM      Component Value Range Status Comment   Specimen Description URINE, RANDOM   Final    Special Requests NONE   Final    Culture  Setup Time [id number]    Final    Colony Count NO GROWTH   Final    Culture NO GROWTH   Final    Report Status 06/15/2011 FINAL   Final     Anti-infectives: Anti-infectives     Start     Dose/Rate Route Frequency Ordered Stop   06/18/11 0600   vancomycin (VANCOCIN) IVPB 1000 mg/200 mL premix     Comments: Pt scheduled for PAC placement in IR 3/4....Marland Kitchenhang Vanco ON CALL to xray 3/4      1,000 mg 200 mL/hr over 60 Minutes Intravenous  Once 06/15/11 0936 06/18/11 0735   06/13/11 1100   ciprofloxacin (CIPRO) IVPB 400 mg     Comments: GIVE ON CALL TO XRAY FOR NEPHROSTOMIES TODAY(2/27)      400 mg 200 mL/hr over 60 Minutes Intravenous On call 06/13/11 1003 06/13/11 1729          Assessment/Plan: S/p bilateral PCN's 2/27; for port a cath placement today; continue PCN's, monitor labs; short term antibiotic ointment/vaseline to PCN insertion sites    LOS: 6 days    Hassel Uphoff,D [facility] 06/18/2011

## 2011-06-18 NOTE — Progress Notes (Signed)
  06/18/2011, 10:24 AM  Hospital day: 7 Antibiotics: none Chemotherapy: planned to begin 06-25-2011    Subjective: Up in chair, crying with pain from PCNs "cannot get comfortable".  Discussed again keeping these thickly padded to keep any pressure and friction minimized. Transfused PRBCs yesterday without difficulty. Bowels finally moved x 1 over weekend, first time in over a week; is on miralax bid and I will add senokot S. Is for PAC placement ~ 1300 today; lovenox DCd yesterday, is NPO. No nausea, has been eating. No bleeding except in urine. Thigh high teds better, less swelling RLE. Objective: Vital signs in last 24 hours: Blood pressure 158/76, pulse 89, temperature 98.7 F (37.1 C), temperature source Oral, resp. rate 18, height 5\' 6"  (1.676 m), weight 238 lb 8.6 oz (108.2 kg), last menstrual period 04/09/2011, SpO2 100.00%. Alert, pleasant but obviously uncomfortable.  Intake/Output from previous day: 03/03 0701 - 03/04 0700 In: 1075 [I.V.:250; Blood:350] Out: 2700 [Urine:2700] Intake/Output this shift: Total I/O In: -  Out: 1050 [Urine:1050]  Physical exam: Respirations not labored RA, lungs without wheezes or rales anteriorly or posteriorly. Cor RRR. Abdomen quiet, not tender. Single layer thin gauze around each PCN, sites dry and clean. Right lower leg no pitting edema now; none LLE either. Urine in bilateral bags looks clear now.   Lab Results:  Basename 06/18/11 0450 06/17/11 0545  WBC 10.8* 10.7*  HGB 8.7* 7.4*  HCT 26.3* 22.2*  PLT 294 291   BMET  Basename 06/18/11 0450 06/17/11 0545  NA 141 143  K 4.0 4.0  CL 113* 115*  CO2 24 24  GLUCOSE 94 113*  BUN 8 9  CREATININE 1.35* 1.37*  CALCIUM 8.8 8.3*    Studies/Results: No results found.  Spoke with IR:  PAC ~ 1300 today  Spoke with gyn oncology: they are aware that patient is still in hospital, as she is scheduled for follow up with Jackie Cook tomorrow. Hopefully Dr.Brewster will see her in  hospital if not DC in time for appointment.   Spoke with oncology unit RN: they have had better pain control from [facility] if use extra gauze on top of present type dressing, followed by ABD pad on top of gauze and paper tape securing ABD. I have written this as order now.  Assessment/Plan: 1. Metastatic leiomyosarcoma of uterus, with extensive disease in pelvis and metastatic to lung. Plan taxotere/gemzar in palliative attempt beginning next week outpatient. 2.Acute nonoliguric renal failure from obstruction: PCNs doing well except local discomfort. Renal function not quite to baseline, but much better. Follow 3.inadequate peripheral IV access for chemo: appreciate IR assistance with PAC today 4.tobacco abuse just DCd 5.anemia: gyn bleeding + gyn surgery + PCNs. Continue po iron 6.Constipation; one BM since admission. Increase laxatives with addition of senokot S tid in addition to bid miralax. 7.Difficult social situation: she does have [facility] funds for prescriptions at [facility] Expect she will be ok for DC in next 24 - 48 hrs if continues to improve.   Jackie Cook P

## 2011-06-18 NOTE — Progress Notes (Signed)
PROGRESS NOTE  Jackie Cook ZOX:[id number] DOB: 24-Jan-1961 DOA: 06/12/2011 PCP: No primary provider on file.  Brief narrative: Jackie Cook is an 51 y.o. female with recent diagnosis of metastatic leiomyosarcoma of uterus who presented to the ER upon the advice of Dr. Darrold Span, who she saw 06/12/11, after routine blood work revealed significant renal impairment. She had a known history of hydronephrosis related to uterine cancer, and it was suspected as the source of her deterioration in renal function.  Renal US showed bilateral hydronephrosis, and urology was consulted for consideration of percutaneous nephrostomy tube placement, which was done by IR on 06/13/11.  Consultants:  Dr. Tama Gander, Urology  Dr. Jama Flavors, Oncology  Dr. Maryclare Bean, Interventional radiology  Procedures:  Bilateral percutaneous nephrostomies 06/13/11   Antibiotics:  Cipro 06/13/11 x 1 dose   Subjective  Jackie Cook is still having a lot of discomfort in her back/flank from nephrostomy tubes.  Feels better after bowels moved yesterday.   Objective    Interim History: Stable over night.    Objective: Filed Vitals:   06/17/11 1526 06/17/11 1619 06/17/11 2226 06/18/11 0709  BP: 149/86 155/71 150/72 158/76  Pulse: 76 92 82 89  Temp: 98.4 F (36.9 C) 98.4 F (36.9 C) 98.5 F (36.9 C) 98.7 F (37.1 C)  TempSrc: Oral Oral Oral Oral  Resp: 18 18 20 18   Height:      Weight:    108.2 kg (238 lb 8.6 oz)  SpO2:   100% 100%    Intake/Output Summary (Last 24 hours) at 06/18/11 1335 Last data filed at 06/18/11 1159  Gross per 24 hour  Intake   1075 ml  Output   2850 ml  Net  -1775 ml    Exam: Gen:  NAD Cardiovascular:  RRR, No M/R/G Respiratory: Lungs CTAB Gastrointestinal: Abdomen soft, NT/ND with normal active bowel sounds. Extremities: RLE edema    Data Reviewed: Basic Metabolic Panel:  Lab 06/18/11 8119 06/17/11 0545 06/16/11 0557 06/15/11 1520 06/15/11 0525    NA 141 143 143 141 142  K 4.0 4.0 -- -- --  CL 113* 115* 112 107 106  CO2 24 24 25 24 25   GLUCOSE 94 113* 122* 133* 104*  BUN 8 9 13 19  25*  CREATININE 1.35* 1.37* 1.74* 2.17* 2.74*  CALCIUM 8.8 8.3* 8.8 9.2 10.0  MG -- -- -- -- --  PHOS -- -- -- -- --   Liver Function Tests:  Lab 06/12/11 0930  AST 11  ALT <8  ALKPHOS 64  BILITOT 0.4  PROT 6.6  ALBUMIN 3.4*   CBC:  Lab 06/18/11 0450 06/17/11 0545 06/16/11 0557 06/15/11 0525 06/14/11 0940  WBC 10.8* 10.7* 11.5* 12.6* 11.0*  NEUTROABS -- -- -- -- --  HGB 8.7* 7.4* 8.1* 9.4* 8.7*  HCT 26.3* 22.2* 24.4* 28.2* 25.9*  MCV 81.4 81.3 80.8 80.3 79.9  PLT 294 291 319 378 348     Recent Results (from the past 240 hour(s))  URINE CULTURE     Status: Normal   Collection Time   06/12/11 10:39 AM      Component Value Range Status Comment   Urine Culture, Routine Culture, Urine   Final   URINE CULTURE     Status: Normal   Collection Time   06/14/11  1:49 PM      Component Value Range Status Comment   Specimen Description URINE, RANDOM   Final    Special Requests Normal   Final  Culture  Setup Time [id number]   Final    Colony Count NO GROWTH   Final    Culture NO GROWTH   Final    Report Status 06/15/2011 FINAL   Final   URINE CULTURE     Status: Normal   Collection Time   06/14/11  1:49 PM      Component Value Range Status Comment   Specimen Description URINE, RANDOM   Final    Special Requests NONE   Final    Culture  Setup Time [id number]   Final    Colony Count NO GROWTH   Final    Culture NO GROWTH   Final    Report Status 06/15/2011 FINAL   Final      Studies:   US Renal 06/12/2011  IMPRESSION: 1.  Moderate right-sided pelvocaliectasis and ureterectasis of the superior aspect of the right ureter, likely unchanged compared to abdominal CT performed 05/07/2011 and possibly a chronic finding.  2.  Likely nonobstructing 1 cm right-sided renal stone.  3.  Mild fullness of the left sided collecting system  suggestive of mild hydronephrosis.  Original Report Authenticated By: Waynard Reeds, M.D.    Scheduled Meds:    . docusate sodium  200 mg Oral Daily  . ferrous fumarate  106 mg of iron Oral Daily  . mulitivitamin with minerals  1 tablet Oral Daily  . polyethylene glycol  17 g Oral BID  . senna-docusate  2 tablet Oral TID  . vancomycin  1,000 mg Intravenous Once   Continuous Infusions:    . dextrose 5 % and 0.9% NaCl 150 mL/hr at 06/18/11 2841     Assessment/Plan: Assessment & Plan:  Principal Problem:  *ARF (acute renal failure)  Likely secondary to obstructive uropathy in the setting of her history of uterine cancer with bulky pelvic disease.  Renal US was done on 06/12/11 which showed bilateral hydronephrosis.   She was seen by the urologist who and with nephrostomy tube placement, which was done by IR on 06/13/11.  Renal function improving with high volume IVF.  Continue IVF but at a lower rate. 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  Dr. Darrold Span has seen the patient.  She plans gemcitabine/ taxotere in palliative attempt when stable (outpatient).  PAC placement scheduled for today. Hydronephrosis, bilateral Status post placement of bilateral percutaneous nephrostomy tubes.  Obesity (BMI 30-39.9)  Patient has had significant weight loss but remains obese with a BMI of 38.1. Seen by dietitian on 06/13/11.    Constipation  We will continue the patient on daily MiraLAX and Colace.  Nausea  Continue anti-emetics when necessary.  Right leg edema  Likely from compressive lymphadenopathy. She apparently had a Doppler exam done which ruled out DVT within the past month.  Normocytic anemia The patient was given 1 unit of PRBCs on 06/13/11 and another unit on 06/17/11.  Likely from AOCD.  Continue iron.     Code Status: Full code Family  Communication: Orpah Greek [phone] Disposition Plan: Home when stable   LOS: 6 days   Hillery Aldo, MD Pager [phone]  06/18/2011, 1:35 PM

## 2011-06-18 NOTE — Progress Notes (Signed)
Brief Pharmacy Note  Was asked to assist with Lovenox dosing for extended VTE prophylaxis after exploratory laparotomy, supracervical hysterectomy 2/5 for uterine cancer.  Acute renal failure was present on admission, so Lovenox was held in anticipation of percutaneous nephrostomy tube placement.  That procedure was completed and Lovenox was resumed 2/28. Having good diuresis s/p PCNs.   Lovenox dosage prior to admission was 40mg  SQ q24h.  Changed to 30mg  q24h 2/28-3/1 for CrCl <30 ml/min  Dose increased to 40mg  q24h on 3/1 due to improved renal function, CrCl > 26ml/min  Labs:  SCr 1.35, CrCl 60 ml/min Hg 8.7 < 7.4 s/p PRBC on 3/3 Weight: 109kg  Lovenox now on hold since yesterday for PAC placement today  F/U plans to resume today - would continue with 40mg  q24h  Continue to follow SCr while inpatient and adjust Lovenox as needed.  Loralee Pacas, PharmD, BCPS Pager: [phone]  06/18/2011,11:39 AM.

## 2011-06-19 ENCOUNTER — Telehealth: Payer: Self-pay | Admitting: Oncology

## 2011-06-19 ENCOUNTER — Other Ambulatory Visit: Payer: Self-pay | Admitting: Oncology

## 2011-06-19 ENCOUNTER — Ambulatory Visit: Payer: Medicaid Other | Admitting: Gynecologic Oncology

## 2011-06-19 DIAGNOSIS — C55 Malignant neoplasm of uterus, part unspecified: Secondary | ICD-10-CM

## 2011-06-19 LAB — CBC
HCT: 27.8 % — ABNORMAL LOW (ref 36.0–46.0)
Hemoglobin: 8.9 g/dL — ABNORMAL LOW (ref 12.0–15.0)
WBC: 11.6 10*3/uL — ABNORMAL HIGH (ref 4.0–10.5)

## 2011-06-19 LAB — BASIC METABOLIC PANEL
Chloride: 108 mEq/L (ref 96–112)
GFR calc Af Amer: 59 mL/min — ABNORMAL LOW (ref >90)
Potassium: 3.9 mEq/L (ref 3.5–5.1)

## 2011-06-19 MED ORDER — FERROUS FUMARATE 325 (106 FE) MG PO TABS: 1.0000 | ORAL_TABLET | Freq: Two times a day (BID) | ORAL | Status: DC

## 2011-06-19 MED ORDER — HYDROMORPHONE HCL 2 MG PO TABS: 2.0000 mg | ORAL_TABLET | ORAL | Status: AC | PRN

## 2011-06-19 MED ORDER — FERROUS FUMARATE 325 (106 FE) MG PO TABS
1.0000 | ORAL_TABLET | Freq: Two times a day (BID) | ORAL | Status: DC
  Administered 2011-06-19: 106 mg via ORAL
  Filled 2011-06-19 (×2): qty 1

## 2011-06-19 MED ORDER — HYDROMORPHONE HCL 2 MG PO TABS
2.0000 mg | ORAL_TABLET | ORAL | Status: DC | PRN
  Administered 2011-06-19 (×2): 2 mg via ORAL
  Filled 2011-06-19 (×2): qty 1

## 2011-06-19 NOTE — Telephone Encounter (Signed)
Called pt,left message regarding appt on 06/22/11, lab and ML

## 2011-06-19 NOTE — Progress Notes (Signed)
  Subjective: bilat PCNs placed 2/27 Port a Cath placed 3/4 Pt doing well Feels good; may go home soon   Objective: Vital signs in last 24 hours: Temp:  [97.5 F (36.4 C)-98.8 F (37.1 C)] 97.5 F (36.4 C) (03/05 0600) Pulse Rate:  [75-89] 75  (03/05 0600) Resp:  [13-18] 18  (03/05 0600) BP: (144-171)/(69-97) 150/71 mmHg (03/05 0600) SpO2:  [98 %-100 %] 98 % (03/05 0600) Last BM Date: 06/17/11  Intake/Output from previous day: 03/04 0701 - 03/05 0700 In: 2400 [I.V.:2400] Out: 4725 [Urine:4725] Intake/Output this shift:    PE: afeb; VSS Rt PCN: intact; site clean and dry Output Rt: 1.6 liters yellow 3/4 Output Lt: 3.1 liters yellow 3/4 PAC site clean and dry   Lab Results:   Basename 06/19/11 0522 06/18/11 0450  WBC 11.6* 10.8*  HGB 8.9* 8.7*  HCT 27.8* 26.3*  PLT 324 294   BMET  Basename 06/19/11 0522 06/18/11 0450  NA 141 141  K 3.9 4.0  CL 108 113*  CO2 22 24  GLUCOSE 89 94  BUN 8 8  CREATININE 1.20* 1.35*  CALCIUM 9.4 8.8   PT/INR No results found for this basename: LABPROT:2,INR:2 in the last 72 hours ABG No results found for this basename: PHART:2,PCO2:2,PO2:2,HCO3:2 in the last 72 hours  Studies/Results: No results found.  Anti-infectives: Anti-infectives     Start     Dose/Rate Route Frequency Ordered Stop   06/18/11 1430   vancomycin (VANCOCIN) 500 mg in sodium chloride 0.9 % 100 mL IVPB        500 mg 100 mL/hr over 60 Minutes Intravenous  Once 06/18/11 1357 06/18/11 1518   06/18/11 0600   vancomycin (VANCOCIN) IVPB 1000 mg/200 mL premix     Comments: Pt scheduled for PAC placement in IR 3/4....Marland Kitchenhang Vanco ON CALL to xray 3/4      1,000 mg 200 mL/hr over 60 Minutes Intravenous  Once 06/15/11 0936 06/18/11 0735   06/13/11 1100   ciprofloxacin (CIPRO) IVPB 400 mg     Comments: GIVE ON CALL TO XRAY FOR NEPHROSTOMIES TODAY(2/27)      400 mg 200 mL/hr over 60 Minutes Intravenous On call 06/13/11 1003 06/13/11 1729           Assessment/Plan: s/p Uterine Ca Bilat PCNS placed 2/27 PAC placed 3/4  Pt doing well; home soon Plan per Dr Estella Husk A 06/19/2011

## 2011-06-19 NOTE — Progress Notes (Signed)
  06/19/2011, 8:30 AM  Hospital day: 8 Antibiotics: none Chemotherapy: scheduled to begin 06-25-11    Subjective: Did well with PAC placement yesterday, just minimally sore there. PCNs more comfortable with ABD pads covering additional gauze, tho still has needed 2 doses of IV dilaudid since MN. No BM since the weekend, that the first time in > a week. Nausea with vomiting last pm x 1, then was able to eat a little. No nausea now. Denies shortness of breath. Discussed changing pain meds to po; discussed pushing po fluids when IVF stopped. She is tolerating po iron well and agrees to increasing this to bid. Dr. Laurette Schimke has seen already this am. Objective: Vital signs in last 24 hours: Blood pressure 150/71, pulse 75, temperature 97.5 F (36.4 C), temperature source Oral, resp. rate 18, height 5\' 6"  (1.676 m), weight 238 lb 8.6 oz (108.2 kg), last menstrual period 04/09/2011, SpO2 98.00%.  Awake, alert, looks more comfortable today. Intake/Output from previous day: 03/04 0701 - 03/05 0700 In: 2400 [I.V.:2400] Out: 4725 [Urine:4725] Intake/Output this shift:    Physical exam: Respirations not labored on RA. PERRL. No JVD. PAC dressed, not accessed, no ecchymoses seen around dressing. Peripheral IV LUE still being used. Cor RRR.Lungs without wheezes or rales. Abdomen full but soft, not tender, not more distended, few bowel sounds. Incision healing. LE no pitting edema with thigh high teds and SCDs on. No apparent bleeding.  Lab Results:  Basename 06/19/11 0522 06/18/11 0450  WBC 11.6* 10.8*  HGB 8.9* 8.7*  HCT 27.8* 26.3*  PLT 324 294   BMET  Basename 06/19/11 0522 06/18/11 0450  NA 141 141  K 3.9 4.0  CL 108 113*  CO2 22 24  GLUCOSE 89 94  BUN 8 8  CREATININE 1.20* 1.35*  CALCIUM 9.4 8.8    Studies/Results: No results found.   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 I will set up return visit with PA at [facility] for 06-22-11. 2.Acute renal failure due to bilateral ureteral obstruction: creatinine continues to improve. Plan long-term PCNs. She will need supplies (bags, dressings including ABD pads) for DC 3.anemia related to blood loss from previous gyn bleeding, recent gyn surgery, hematuria and renal dysfunction/ chronic disease. Continue po iron and will follow at [facility] after DC 4.PAC in 5.constipation: related to narcotics, recent surgery and tumor. Continue bid miralax and senokot S 6.No insurance: she does have some limited funds from [facility] patient assistance to use at [facility]  From my standpoint, should be ok for DC when able to take po fluids very well and is tolerating po pain meds, later today or tomorrow.   Jahnasia Tatum P

## 2011-06-19 NOTE — Progress Notes (Signed)
Jackie Cook 51 y.o. female  Chief Complaint   Patient presents with   .  Leiomyosarcoma     Follow up   HPI: 50 year old white female presented with an abdominal pelvic mass. Subsequently she's had a CT scan which shows a 20 x 16 x 16 cm abdominal pelvic mass. This is thought to be a large uterus with a central area of low attenuation consistent with necrosis. There is some evidence of a pedunculated mass measuring 10 x 8.8 x 10.7 cm posterior to the main lesion along the right iliac vessels is a large nodal mass measuring 11 x 7.5 cm which encased the iliac vessels and ureter creating a right hydronephrosis which is severe. The liver spleen and adrenal glands and left kidney are otherwise unremarkable there is no evidence of ascites. The patient denies any past gynecologic history. Chest x-ray was notable for 2 metastatic lesions.   On February 5 she underwent an exploratory laparotomy. Operative findings were notable for a 24 cm uterus extending to bilateral pelvic sidewalls. The cervix was deviated towards the right. A proximately 15 cm retroperitoneal mass was noted extending from the level of the cervix to the bifurcation of the right iliac vessels. Tumor was also noted to be extruding from the right cornua and adherent to the retroperitoneal mass. She underwent supracervical hysterectomy bilateral salpingo-oophorectomy. Final pathology  Uterus +/- tubes/ovaries, neoplastic, and detached fibroid  - LEIOMYOSARCOMA, MULTIFOCAL, SPANNING AT LEAST 17.0 CM.  - TUMOR INVOLVES UTERUS AND RIGHT ADNEXA  - LYMPHOVASCULAR INVASION IS IDENTIFIED.  - SEE ONCOLOGY TABLE BELOW.  Microscopic Comment  UTERUS  Specimen: Uterus and bilateral adnexa.  Procedure: Supracervical hysterectomy and bilateral salpingo-oophorectomy.  Lymph node sampling performed: No.  Specimen integrity: Intact.  Maximum tumor size (gross measurement): At least 17.0 cm.  Histologic type: Leiomyosarcoma.  Grade: High grade.    Myometrial invasion: N/A.  Cervical stromal involvement: Not identified.  Extent of involvement of other organs: Involves the right adnexa.  Lymph vascular invasion: Present, scattered foci.  Peritoneal washings: N/A.  Lymph nodes: Number examined 0; number positive N/A.  TNM code: pT2a, pNX, at least.  FIGO Stage (based on pathologic findings, needs clinical correlation): IIA, at least.  Comments: There are multiple foci of leiomyosarcoma; the largest grossly spans 17.0 cm and is present in  the myometrium. In addition to this largest nodule, there are two other grossly identified sarcomas in the right  para adnexa tissue, spanning 6.0 and 4.5 cm. The myometrium also contains benign leiomyomata and the  endometrium contains two benign endometrioid polyps.   Patient with interval disease progression, now with bilateral ureteral obstruction.  S/P placement of bilateral nephrostomy tubes and port placement.   Allergies   Allergen  Reactions   .  Aspirin      GI upset   .  Morphine And Related  Itching   .  Penicillins  Itching    Past Medical History   Diagnosis  Date   .  Anemia    .  Blood transfusion      2001   .  H/O hiatal hernia     Past Surgical History   Procedure  Date   Supracervical hysterectomy BSO      Current Outpatient Prescriptions   Medication  Sig  Dispense  Refill   .  docusate sodium (COLACE) 100 MG capsule  Take 100 mg by mouth daily.     Marland Kitchen  enoxaparin (LOVENOX) 40 MG/0.4ML SOLN  Inject 0.4  mLs (40 mg total) into the skin daily.  11.2 mL  1   .  ferrous sulfate 325 (65 FE) MG tablet  Take 325 mg by mouth daily.     Marland Kitchen  HYDROcodone-acetaminophen (VICODIN) 5-500 MG per tablet  Take 1 tablet by mouth every 6 (six) hours as needed for pain (Can take up to 2 pills at one time).  60 tablet  0   .  Multiple Vitamins-Minerals (MULTIVITAMIN PO)  Take 1 tablet by mouth daily.     .  promethazine (PHENERGAN) 25 MG tablet  Take 25 mg by mouth every 6 (six) hours as  needed.      History    Social History   .  Marital Status:  Single     Spouse Name:  N/A     Number of Children:  N/A   .  Years of Education:  N/A    Occupational History   .  Not on file.    Social History Main Topics   .  Smoking status:  Current Everyday Smoker -- 0.5 packs/day for 34 years   .  Smokeless tobacco:  Never Used   .  Alcohol Use:  No   .  Drug Use:  No   .  Sexually Active:  No    Other Topics  Concern   .  Not on file    Social History Narrative   .  No narrative on file    Family History   Problem  Relation  Age of Onset   .  Breast cancer  Maternal Aunt    .  Heart attack  Other    Review of Systems: Reports nausea yesterday,  no vomiting. Reports discomfort from the nephrostomy tubes.  Reports worsening edema of the lower extremities.    Denies vaginal bleeding, no rectal bleeding.   Physical Exam: A pleasant female, comfortable in NAD  HEENT is negative  There is no supraclavicular or inguinal adenopathy.  Back No CVAT Bilateral nephrostomy tubes Abdomen; soft nontender. Midline incision healing well without any evidence of wound discharge or hernia Extremities: Edema of the right upper thigh from the knee to the inguinal area.  Edema of bilateral calves.    .Assessment/Plan:  Stage IV uterine leiomyosarcoma. Patient has significant residual disease in the right retroperitoneal area with obstruction of the ureter.and metastases to the lung.  Now with progression of disease.  Very poor prognosis.  Plan to start Gemcitabine/Docetaxol March 11.  Patient aware that nephrostomy tubes may be removed when she no longer desires therapy.

## 2011-06-19 NOTE — Discharge Summary (Signed)
Physician Discharge Summary  Patient ID: Jackie Cook MRN: [id number] DOB/AGE: 07/08/1960 51 y.o.  Admit date: 06/12/2011 Discharge date: 06/19/2011  Primary Care Physician:  No primary provider on file.   Oncologist: Dr. Jama Flavors   Discharge Diagnoses:    Present on Admission:  .ARF (acute renal failure) .Bilateral hydronephrosis .Leiomyosarcoma of uterus .Obesity (BMI 30-39.9) .Constipation .Nausea .Leg edema, right  Discharge Medications:  Medication List  As of 06/19/2011  1:57 PM   STOP taking these medications         enoxaparin 40 MG/0.4ML Soln      PERCOCET 5-325 MG per tablet         TAKE these medications         docusate sodium 100 MG capsule   Commonly known as: COLACE   Take 200 mg by mouth daily.      ferrous fumarate 325 (106 FE) MG Tabs   Commonly known as: HEMOCYTE - 106 mg FE   Take 1 tablet (106 mg of iron total) by mouth 2 (two) times daily.      HYDROmorphone 2 MG tablet   Commonly known as: DILAUDID   Take 1 tablet (2 mg total) by mouth every 3 (three) hours as needed.      MULTIVITAMIN PO   Take 1 tablet by mouth daily.      polyethylene glycol powder powder   Commonly known as: GLYCOLAX/MIRALAX   Take 17 g by mouth daily.      promethazine 25 MG tablet   Commonly known as: PHENERGAN   Take 1 tablet (25 mg total) by mouth every 6 (six) hours as needed for nausea.             Disposition and Follow-up: The patient is being discharged home.  She will follow up with Dr. Darrold Span on 06/22/11.  Consultants:  Dr. Tama Gander, Urology  Dr. Jama Flavors, Oncology  Dr. Maryclare Bean, Interventional radiology  Significant Diagnostic Studies:    US Renal 06/12/2011  IMPRESSION: 1.  Moderate right-sided pelvocaliectasis and ureterectasis of the superior aspect of the right ureter, likely unchanged compared to abdominal CT performed 05/07/2011 and possibly a chronic finding.  2.  Likely nonobstructing 1 cm right-sided renal  stone.  3.  Mild fullness of the left sided collecting system suggestive of mild hydronephrosis.  Original Report Authenticated By: Waynard Reeds, M.D.    Ir Perc Nephrostomy Left 06/14/2011   IMPRESSION: Bilateral percutaneous nephrostomy catheter placement.  Original Report Authenticated By: Donavan Burnet, M.D.    Ir Perc Nephrostomy Right 06/14/2011   IMPRESSION: Bilateral percutaneous nephrostomy catheter placement.  Original Report Authenticated By: Donavan Burnet, M.D.    Ir Fluoro Guide Cv Line Right 06/19/2011 IMPRESSION: Successful 8 French right internal jugular vein power port placement with its tip at the SVC/RA junction.  Original Report Authenticated By: Donavan Burnet, M.D.    Ir US Guide Vasc Access Right 06/19/2011  IMPRESSION: Successful 8 French right internal jugular vein power port placement with its tip at the SVC/RA junction.  Original Report Authenticated By: Donavan Burnet, M.D.    Ir US Guide Bx Asp/drain 06/14/2011  IMPRESSION: Bilateral percutaneous nephrostomy catheter placement.  Original Report Authenticated By: Donavan Burnet, M.D.     Discharge Laboratory Values: Basic Metabolic Panel:  Lab 06/19/11 1308 06/18/11 0450 06/17/11 0545 06/16/11 0557 06/15/11 1520  NA 141 141 143 143 141  K 3.9 4.0 -- -- --  CL 108 113*  115* 112 107  CO2 22 24 24 25 24   GLUCOSE 89 94 113* 122* 133*  BUN 8 8 9 13 19   CREATININE 1.20* 1.35* 1.37* 1.74* 2.17*  CALCIUM 9.4 8.8 8.3* 8.8 9.2  MG -- -- -- -- --  PHOS -- -- -- -- --   GFR Estimated Creatinine Clearance: 68.3 ml/min (by C-G formula based on Cr of 1.2). Coagulation profile  Lab 06/13/11 1030  INR 1.13  PROTIME --    CBC:  Lab 06/19/11 0522 06/18/11 0450 06/17/11 0545 06/16/11 0557 06/15/11 0525  WBC 11.6* 10.8* 10.7* 11.5* 12.6*  NEUTROABS -- -- -- -- --  HGB 8.9* 8.7* 7.4* 8.1* 9.4*  HCT 27.8* 26.3* 22.2* 24.4* 28.2*  MCV 81.5 81.4 81.3 80.8 80.3  PLT 324 294 291 319 378   Microbiology Recent  Results (from the past 240 hour(s))  URINE CULTURE     Status: Normal   Collection Time   06/12/11 10:39 AM      Component Value Range Status Comment   Urine Culture, Routine Culture, Urine   Final   URINE CULTURE     Status: Normal   Collection Time   06/14/11  1:49 PM      Component Value Range Status Comment   Specimen Description URINE, RANDOM   Final    Special Requests Normal   Final    Culture  Setup Time [id number]   Final    Colony Count NO GROWTH   Final    Culture NO GROWTH   Final    Report Status 06/15/2011 FINAL   Final   URINE CULTURE     Status: Normal   Collection Time   06/14/11  1:49 PM      Component Value Range Status Comment   Specimen Description URINE, RANDOM   Final    Special Requests NONE   Final    Culture  Setup Time [id number]   Final    Colony Count NO GROWTH   Final    Culture NO GROWTH   Final    Report Status 06/15/2011 FINAL   Final      Brief H and P: For complete details please refer to admission H and P, but in brief, Jackie Cook is an 51 y.o. female with recent diagnosis of metastatic leiomyosarcoma of uterus who presented to the ER upon the advice of Dr. Darrold Span, who she saw 06/12/11, after routine blood work revealed significant renal impairment. She had a known history of hydronephrosis related to uterine cancer, and it was suspected as the source of her deterioration in renal function.    Physical Exam at Discharge: BP 150/71  Pulse 75  Temp(Src) 97.5 F (36.4 C) (Oral)  Resp 18  Ht 5\' 6"  (1.676 m)  Wt 108.2 kg (238 lb 8.6 oz)  BMI 38.50 kg/m2  SpO2 98%  LMP 04/09/2011 Gen:  NAD Cardiovascular:  RRR, No M/R/G Respiratory: Lungs CTAB Gastrointestinal: Abdomen soft, NT/ND with normal active bowel sounds. Extremities: No C/E/C   Hospital Course:  *ARF (acute renal failure)  Likely secondary to obstructive uropathy in the setting of her history of uterine cancer with bulky pelvic disease. Renal US was done on 06/12/11  which showed bilateral hydronephrosis. She was seen by the urologist who and with nephrostomy tube placement, which was done by IR on 06/13/11. Renal function improved with high volume IVF. She is being discharged with the nephrostomy tubes in place. Active Problems:  Leiomyosarcoma of uterus  Patient  is status post total abdominal hysterectomy but her disease has metastasized to the lungs and treatment options are not curative. Dr. Darrold Span has seen the patient. She plans gemcitabine/ taxotere in palliative attempt when stable (outpatient). PAC placement performed 06/18/11.  Will follow up with Dr. Darrold Span 06/22/11.  Hydronephrosis, bilateral  Status post placement of bilateral percutaneous nephrostomy tubes.  Obesity (BMI 30-39.9)  Patient has had significant weight loss but remains obese with a BMI of 38.1. Seen by dietitian on 06/13/11.  Constipation  Responded to daily MiraLAX and Colace.  Nausea  Was continued on anti-emetics when necessary. No nausea at discharge. Right leg edema  Likely from compressive lymphadenopathy. She apparently had a Doppler exam done which ruled out DVT within the past month.  Normocytic anemia  The patient was given 1 unit of PRBCs on 06/13/11 and another unit on 06/17/11. Likely from AOCD. Continue iron.     Recommendations for hospital follow-up: 1.  F/U with Dr. Darrold Span for chemotherapy as scheduled.  Diet:  Low sodium, heart healthy  Activity:  Increase activity slowly  Condition at Discharge:   Improved  Time spent on Discharge:  35 minutes  Signed: Dr. Trula Ore Gianni Mihalik Pager [phone] 06/19/2011, 1:57 PM

## 2011-06-19 NOTE — Progress Notes (Signed)
Brief Pharmacy Note  Please advise on when Lovenox should be resumed now that [facility] placement completed.  Thank you-  Elie Goody, PharmD, BCPS Pager: [phone] 06/19/2011  9:14 AM

## 2011-06-20 ENCOUNTER — Encounter: Payer: Self-pay | Admitting: Oncology

## 2011-06-20 NOTE — Progress Notes (Signed)
Patient received three prescriptions from [facility] on 06/19/11 $15.66,her remaning balance [facility] $350.90.

## 2011-06-21 ENCOUNTER — Other Ambulatory Visit (HOSPITAL_COMMUNITY): Payer: Self-pay

## 2011-06-22 ENCOUNTER — Ambulatory Visit (HOSPITAL_BASED_OUTPATIENT_CLINIC_OR_DEPARTMENT_OTHER): Payer: Self-pay | Admitting: Physician Assistant

## 2011-06-22 ENCOUNTER — Encounter: Payer: Self-pay | Admitting: Physician Assistant

## 2011-06-22 ENCOUNTER — Other Ambulatory Visit (HOSPITAL_BASED_OUTPATIENT_CLINIC_OR_DEPARTMENT_OTHER): Payer: Self-pay | Admitting: Lab

## 2011-06-22 VITALS — BP 150/81 | HR 89 | Temp 98.0°F | Ht 66.0 in | Wt 246.7 lb

## 2011-06-22 DIAGNOSIS — C55 Malignant neoplasm of uterus, part unspecified: Secondary | ICD-10-CM

## 2011-06-22 LAB — CBC WITH DIFFERENTIAL/PLATELET
BASO%: 0.2 % (ref 0.0–2.0)
Eosinophils Absolute: 0.4 10*3/uL (ref 0.0–0.5)
LYMPH%: 11.4 % — ABNORMAL LOW (ref 14.0–49.7)
MCHC: 33.7 g/dL (ref 31.5–36.0)
MCV: 83.4 fL (ref 79.5–101.0)
MONO%: 10.8 % (ref 0.0–14.0)
Platelets: 360 10*3/uL (ref 145–400)
RBC: 3.34 10*6/uL — ABNORMAL LOW (ref 3.70–5.45)

## 2011-06-22 LAB — BASIC METABOLIC PANEL
BUN: 8 mg/dL (ref 6–23)
CO2: 28 mEq/L (ref 19–32)
Chloride: 103 mEq/L (ref 96–112)
Glucose, Bld: 85 mg/dL (ref 70–99)
Potassium: 3.4 mEq/L — ABNORMAL LOW (ref 3.5–5.3)

## 2011-06-24 ENCOUNTER — Other Ambulatory Visit: Payer: Self-pay | Admitting: Oncology

## 2011-06-25 ENCOUNTER — Encounter: Payer: Self-pay | Admitting: Oncology

## 2011-06-25 ENCOUNTER — Ambulatory Visit (HOSPITAL_BASED_OUTPATIENT_CLINIC_OR_DEPARTMENT_OTHER): Payer: Self-pay

## 2011-06-25 ENCOUNTER — Other Ambulatory Visit: Payer: Self-pay

## 2011-06-25 ENCOUNTER — Other Ambulatory Visit (HOSPITAL_BASED_OUTPATIENT_CLINIC_OR_DEPARTMENT_OTHER): Payer: Self-pay | Admitting: Lab

## 2011-06-25 VITALS — BP 140/78 | HR 79 | Temp 98.1°F

## 2011-06-25 DIAGNOSIS — C55 Malignant neoplasm of uterus, part unspecified: Secondary | ICD-10-CM

## 2011-06-25 DIAGNOSIS — Z5111 Encounter for antineoplastic chemotherapy: Secondary | ICD-10-CM

## 2011-06-25 LAB — CBC WITH DIFFERENTIAL/PLATELET
BASO%: 0.1 % (ref 0.0–2.0)
EOS%: 0.1 % (ref 0.0–7.0)
LYMPH%: 7 % — ABNORMAL LOW (ref 14.0–49.7)
MCHC: 33.3 g/dL (ref 31.5–36.0)
MONO#: 0.7 10*3/uL (ref 0.1–0.9)
Platelets: 400 10*3/uL (ref 145–400)
RBC: 3.5 10*6/uL — ABNORMAL LOW (ref 3.70–5.45)
WBC: 20.8 10*3/uL — ABNORMAL HIGH (ref 3.9–10.3)
nRBC: 0 % (ref 0–0)

## 2011-06-25 LAB — BASIC METABOLIC PANEL
Calcium: 9.9 mg/dL (ref 8.4–10.5)
Chloride: 99 mEq/L (ref 96–112)
Creatinine, Ser: 1.09 mg/dL (ref 0.50–1.10)

## 2011-06-25 MED ORDER — DEXAMETHASONE 4 MG PO TABS: 8.0000 mg | ORAL_TABLET | Freq: Two times a day (BID) | ORAL | Status: DC

## 2011-06-25 MED ORDER — ONDANSETRON 8 MG/50ML IVPB (CHCC)
8.0000 mg | Freq: Once | INTRAVENOUS | Status: AC
  Administered 2011-06-25: 8 mg via INTRAVENOUS

## 2011-06-25 MED ORDER — SODIUM CHLORIDE 0.9 % IJ SOLN
10.0000 mL | INTRAMUSCULAR | Status: DC | PRN
  Filled 2011-06-25: qty 10

## 2011-06-25 MED ORDER — SODIUM CHLORIDE 0.9 % IV SOLN
900.0000 mg/m2 | Freq: Once | INTRAVENOUS | Status: AC
  Administered 2011-06-25: 2052 mg via INTRAVENOUS
  Filled 2011-06-25: qty 54

## 2011-06-25 MED ORDER — HEPARIN SOD (PORK) LOCK FLUSH 100 UNIT/ML IV SOLN
500.0000 [IU] | Freq: Once | INTRAVENOUS | Status: DC | PRN
  Filled 2011-06-25: qty 5

## 2011-06-25 MED ORDER — DEXAMETHASONE SODIUM PHOSPHATE 10 MG/ML IJ SOLN
10.0000 mg | Freq: Once | INTRAMUSCULAR | Status: AC
  Administered 2011-06-25: 10 mg via INTRAVENOUS

## 2011-06-25 MED ORDER — SODIUM CHLORIDE 0.9 % IV SOLN: Freq: Once | INTRAVENOUS | Status: DC

## 2011-06-25 NOTE — Progress Notes (Signed)
No images are attached to the encounter. No scans are attached to the encounter. No scans are attached to the encounter. [facility] OFFICE PROGRESS NOTE  No primary provider on file. No primary provider on file.  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 female returns for a scheduled regular office visit for followup of her metastatic leiomyosarcoma. She is status post bilateral PCNs. They are functioning well but are still uncomfortable and painful. The home health nurse changed her dressings this morning and noticed a little redness at the exit site of the [facility]. She applied some neosporin and redressed the PCNs. Her current pain medication is not adequately controlling her pain. She is currently taking Dilaudid 2 mg every three hours.. She also complains of her legs still being swollen and tight. Her appetite is fair but she is forcing herself to eat.   MEDICAL HISTORY: Past Medical History  Diagnosis Date  . Anemia     Iron deficiency  . Blood transfusion     2001  . H/O hiatal hernia   . 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 H/O menorrhagia   . DDD (degenerative disc disease)     ALLERGIES:  is allergic to aspirin; morphine and related; and penicillins.  MEDICATIONS:  Current Outpatient Prescriptions  Medication Sig Dispense Refill  . docusate sodium (COLACE) 100 MG capsule Take 200 mg by mouth daily.       . ferrous fumarate (HEMOCYTE - 106 MG FE) 325 (106 FE) MG TABS Take 1 tablet (106 mg of iron total) by mouth 2 (two) times daily.  60 each  2  . HYDROmorphone (DILAUDID) 2 MG tablet Take 1 tablet (2 mg total) by mouth every 3 (three) hours as needed.  30 tablet  0  . Multiple  Vitamins-Minerals (MULTIVITAMIN PO) Take 1 tablet by mouth daily.      . polyethylene glycol powder (MIRALAX) powder Take 17 g by mouth daily.  255 g  1  . promethazine (PHENERGAN) 25 MG tablet Take 1 tablet (25 mg total) by mouth every 6 (six) hours as needed for nausea.  30 tablet  0  . dexamethasone (DECADRON) 4 MG tablet Take 2 tablets (8 mg total) by mouth 2 (two) times daily with a meal. 2 tablets ( 8 mg) by mouth twice a day, the day before, the day of and the day after chemotherapy  12 tablet  1   No current facility-administered medications for this visit.   Facility-Administered Medications Ordered in Other Visits  Medication Dose Route Frequency Provider Last Rate Last Dose  . dexamethasone (DECADRON) injection 10 mg  10 mg Intravenous Once Lennis Buzzy Han, MD   10 mg at 06/25/11 1505  . gemcitabine (GEMZAR) 2,052 mg in sodium chloride 0.9 % 250 mL chemo infusion  900 mg/m2 (Treatment Plan Actual) Intravenous Once Reece Packer, MD   2,052 mg at 06/25/11 1535  . ondansetron (ZOFRAN) IVPB 8 mg  8 mg Intravenous Once Lennis Buzzy Han, MD   8 mg at 06/25/11 1505  . DISCONTD: 0.9 %  sodium chloride infusion   Intravenous Once Lennis Buzzy Han, MD      . DISCONTD: heparin lock flush 100 unit/mL  500 Units Intracatheter Once PRN Lennis Buzzy Han, MD      .  DISCONTD: sodium chloride 0.9 % injection 10 mL  10 mL Intracatheter PRN Lennis Buzzy Han, MD        SURGICAL HISTORY:  Past Surgical History  Procedure Date  . Total abdominal hysterectomy w/ bilateral salpingoophorectomy 05/22/11  . Laparotomy 05/22/2011    Procedure: EXPLORATORY LAPAROTOMY;  Surgeon: Laurette Schimke, MD PHD;  Location: [facility] ORS;  Service: Gynecology;  Laterality: N/A;  . Abdominal hysterectomy 05/22/2011    Procedure: HYSTERECTOMY ABDOMINAL;  Surgeon: Laurette Schimke, MD PHD;  Location: [facility];  Service: Gynecology;  Laterality: N/A;  with Resection of Pelvic Mass    . Salpingoophorectomy 05/22/2011    Procedure:  SALPINGO OOPHERECTOMY;  Surgeon: Laurette Schimke, MD PHD;  Location: [facility];  Service: Gynecology;  Laterality: Bilateral;    REVIEW OF SYSTEMS:  A comprehensive review of systems was negative except for: Constitutional: positive for anorexia and malaise Gastrointestinal: positive for constipation Musculoskeletal: positive for back pain   PHYSICAL EXAMINATION: General appearance: alert, cooperative and tearful Head: Normocephalic, without obvious abnormality, atraumatic Neck: no adenopathy, no carotid bruit, no JVD, supple, symmetrical, trachea midline and thyroid not enlarged, symmetric, no tenderness/mass/nodules Lymph nodes: Cervical, supraclavicular, and axillary nodes normal. Resp: clear to auscultation bilaterally Cardio: regular rate and rhythm, S1, S2 normal, no murmur, click, rub or gallop GI: soft, non-tender; bowel sounds normal; no masses,  no organomegaly and well healed midline surgical incision Extremities: edema 2+ pitting edema biateral lower extremities  ECOG PERFORMANCE STATUS: 1 - Symptomatic but completely ambulatory  Blood pressure 150/81, pulse 89, temperature 98 F (36.7 C), temperature source Oral, height 5\' 6"  (1.676 m), weight 246 lb 11.2 oz (111.902 kg), last menstrual period 04/09/2011.  LABORATORY DATA: Lab Results  Component Value Date   WBC 20.8* 06/25/2011   HGB 9.2* 06/25/2011   HCT 27.6* 06/25/2011   MCV 78.9* 06/25/2011   PLT 400 06/25/2011      Chemistry      Component Value Date/Time   NA 139 06/25/2011 1416   K 3.9 06/25/2011 1416   CL 99 06/25/2011 1416   CO2 29 06/25/2011 1416   BUN 17 06/25/2011 1416   CREATININE 1.09 06/25/2011 1416      Component Value Date/Time   CALCIUM 9.9 06/25/2011 1416   ALKPHOS 64 06/12/2011 0930   AST 11 06/12/2011 0930   ALT <8 06/12/2011 0930   BILITOT 0.4 06/12/2011 0930       RADIOGRAPHIC STUDIES:  US Renal  06/12/2011  *RADIOLOGY REPORT*  Clinical Data: Acute renal failure; history of hysterectomy and  oophorectomy for uterine cancer (05/31/2011).  RENAL/URINARY TRACT ULTRASOUND COMPLETE  Comparison:  CT of the abdomen pelvis - 05/07/2011  Findings:  Examination is slightly degraded secondary to patient body habitus.  Right Kidney:  The right kidney is normal in size, measuring 12.2 cm in length.  Normal cortical echogenicity and thickness.  There is moderate right-sided hydronephrosis with dilatation of the visualized aspect of the visualized portion the superior aspect of the right ureter.  This finding is likely unchanged compared to prior abdominal CT performed 05/07/2011).  No perinephric stranding. There is a approximately 7 x 10 mm echogenic likely nonobstructing renal stone within the inferior aspect of the right kidney (image 29).  Left Kidney:  Suboptimally visualized secondary to poor sonographic window.  The left kidney appears grossly normal in size measuring approximately 13.3 cm in length.  Grossly normal cortical echogenicity and thickness.  There is mild fullness of the left renal pelvis and calyces.  No perinephric stranding.  No echogenic renal stones.  Bladder:  Decompressed with a Foley catheter.  IMPRESSION: 1.  Moderate right-sided pelvocaliectasis and ureterectasis of the superior aspect of the right ureter, likely unchanged compared to abdominal CT performed 05/07/2011 and possibly a chronic finding.  2.  Likely nonobstructing 1 cm right-sided renal stone.  3.  Mild fullness of the left sided collecting system suggestive of mild hydronephrosis.  Original Report Authenticated By: Waynard Reeds, M.D.   Ir Perc Nephrostomy Left  06/14/2011  *RADIOLOGY REPORT*  Clinical Data/Indication: RENAL FAILURE.  URETERAL OBSTRUCTION.  PERC NEPHROSTOMY*R*,PERC NEPHROSTOMY*L*,IR ULTRASOUND GUIDANCE TISSUE ABLATION  Sedation: Versed 4 mg, Fentanyl 200 mg.  Total Moderate Sedation Time: 30 minutes.  Contrast Volume: 30 ml Omnipaque-300.  Additional Medications: Ciprofloxacin 400 mg IV.  Fluoroscopy  Time: 1.9 minutes.  Procedure: The procedure, risks, benefits, and alternatives were explained to the patient. Questions regarding the procedure were encouraged and answered. The patient understands and consents to the procedure.  The low back was prepped with betadine in a sterile fashion, and a sterile drape was applied covering the operative field. A sterile gown and sterile gloves were used for the procedure.  Under sonographic guidance, a 21 gauge needle was inserted into the lower pole calix of the left kidney.  Contrast was injected.  Was removed over a 0018 wire which was upsized to a three J.  10-French dilator followed by 10-French drain were inserted and coiled in the renal pelvis.  It was looped and string fixed.  Contrast was injected.  It was sewn to the skin.  The identical procedure was performed for the right kidney.  Findings: Bilateral lower pole renal collecting system access followed by bilateral percutaneous nephrostomy catheter placement into the renal pelvis bilaterally is documented.  Complications: None.  IMPRESSION: Bilateral percutaneous nephrostomy catheter placement.  Original Report Authenticated By: Donavan Burnet, M.D.   Ir Perc Nephrostomy Right  06/14/2011  *RADIOLOGY REPORT*  Clinical Data/Indication: RENAL FAILURE.  URETERAL OBSTRUCTION.  PERC NEPHROSTOMY*R*,PERC NEPHROSTOMY*L*,IR ULTRASOUND GUIDANCE TISSUE ABLATION  Sedation: Versed 4 mg, Fentanyl 200 mg.  Total Moderate Sedation Time: 30 minutes.  Contrast Volume: 30 ml Omnipaque-300.  Additional Medications: Ciprofloxacin 400 mg IV.  Fluoroscopy Time: 1.9 minutes.  Procedure: The procedure, risks, benefits, and alternatives were explained to the patient. Questions regarding the procedure were encouraged and answered. The patient understands and consents to the procedure.  The low back was prepped with betadine in a sterile fashion, and a sterile drape was applied covering the operative field. A sterile gown and sterile  gloves were used for the procedure.  Under sonographic guidance, a 21 gauge needle was inserted into the lower pole calix of the left kidney.  Contrast was injected.  Was removed over a 0018 wire which was upsized to a three J.  10-French dilator followed by 10-French drain were inserted and coiled in the renal pelvis.  It was looped and string fixed.  Contrast was injected.  It was sewn to the skin.  The identical procedure was performed for the right kidney.  Findings: Bilateral lower pole renal collecting system access followed by bilateral percutaneous nephrostomy catheter placement into the renal pelvis bilaterally is documented.  Complications: None.  IMPRESSION: Bilateral percutaneous nephrostomy catheter placement.  Original Report Authenticated By: Donavan Burnet, M.D.   Ir Fluoro Guide Cv Line Right  06/19/2011  *RADIOLOGY REPORT*  Clinical Data/Indication: Sarcoma  TUNNEL POWER PORT PLACEMENT WITH SUBCUTANEOUS POCKET UTILIZING ULTRASOUND & FLOUROSCOPY  Sedation: Versed 2 mg, Fentanyl 200 mcg.  Total Moderate Sedation Time: 60 minutes.  Vancomycin was given within two hours of incision.  Vancomycin was given due to an antibiotic allergy.  Fluoroscopy Time: 0.4 minutes.  Procedure:  After written informed consent was obtained, patient was placed in the supine position on angiographic table. The right neck and chest was prepped and draped in a sterile fashion. Lidocaine was utilized for local anesthesia.  The right internal jugular vein was noted to be patent initially with ultrasound. Under sonographic guidance, a micropuncture needle was inserted into the right IJ vein (Ultrasound and fluoroscopic image documentation was performed). The needle was removed over an 018 wire which was exchanged for a Amplatz.  This was advanced into the IVC.  An 8-French dilator was advanced over the Amplatz.  A small incision was made in the right upper chest over the anterior right second rib.  Utilizing blunt dissection,  a subcutaneous pocket was created in the caudal direction. The pocket was irrigated with a copious amount of sterile normal saline.  The port catheter was tunneled from the chest incision, and out the neck incision.  The reservoir was inserted into the subcutaneous pocket and secured with two 3-0 Ethilon stitches.  A peel-away sheath was advanced over the Amplatz wire.  The port catheter was cut to measure length and inserted through the peel-away sheath. The peel-away sheath was removed.  The chest incision was closed with 3-0 Vicryl interrupted stitches for the subcutaneous tissue and a running of 4-0 Vicryl subcuticular stitch for the skin.  The neck incision was closed with a 4-0 Vicryl subcuticular stitch. Derma-bond was applied to both surgical incisions.  The port reservoir was flushed and instilled with heparinized saline.  No complications.  Findings:  A right IJ vein Port-A-Cath is in place with its tip at the cavoatrial junction.  IMPRESSION: Successful 8 French right internal jugular vein power port placement with its tip at the SVC/RA junction.  Original Report Authenticated By: Donavan Burnet, M.D.   Ir US Guide Vasc Access Right  06/19/2011  *RADIOLOGY REPORT*  Clinical Data/Indication: Sarcoma  TUNNEL POWER PORT PLACEMENT WITH SUBCUTANEOUS POCKET UTILIZING ULTRASOUND & FLOUROSCOPY  Sedation: Versed 2 mg, Fentanyl 200 mcg.  Total Moderate Sedation Time: 60 minutes.  Vancomycin was given within two hours of incision.  Vancomycin was given due to an antibiotic allergy.  Fluoroscopy Time: 0.4 minutes.  Procedure:  After written informed consent was obtained, patient was placed in the supine position on angiographic table. The right neck and chest was prepped and draped in a sterile fashion. Lidocaine was utilized for local anesthesia.  The right internal jugular vein was noted to be patent initially with ultrasound. Under sonographic guidance, a micropuncture needle was inserted into the right IJ vein  (Ultrasound and fluoroscopic image documentation was performed). The needle was removed over an 018 wire which was exchanged for a Amplatz.  This was advanced into the IVC.  An 8-French dilator was advanced over the Amplatz.  A small incision was made in the right upper chest over the anterior right second rib.  Utilizing blunt dissection, a subcutaneous pocket was created in the caudal direction. The pocket was irrigated with a copious amount of sterile normal saline.  The port catheter was tunneled from the chest incision, and out the neck incision.  The reservoir was inserted into the subcutaneous pocket and secured with two 3-0 Ethilon stitches.  A peel-away sheath was advanced over the Amplatz  wire.  The port catheter was cut to measure length and inserted through the peel-away sheath. The peel-away sheath was removed.  The chest incision was closed with 3-0 Vicryl interrupted stitches for the subcutaneous tissue and a running of 4-0 Vicryl subcuticular stitch for the skin.  The neck incision was closed with a 4-0 Vicryl subcuticular stitch. Derma-bond was applied to both surgical incisions.  The port reservoir was flushed and instilled with heparinized saline.  No complications.  Findings:  A right IJ vein Port-A-Cath is in place with its tip at the cavoatrial junction.  IMPRESSION: Successful 8 French right internal jugular vein power port placement with its tip at the SVC/RA junction.  Original Report Authenticated By: Donavan Burnet, M.D.   Ir US Guide Bx Asp/drain  06/14/2011  *RADIOLOGY REPORT*  Clinical Data/Indication: RENAL FAILURE.  URETERAL OBSTRUCTION.  PERC NEPHROSTOMY*R*,PERC NEPHROSTOMY*L*,IR ULTRASOUND GUIDANCE TISSUE ABLATION  Sedation: Versed 4 mg, Fentanyl 200 mg.  Total Moderate Sedation Time: 30 minutes.  Contrast Volume: 30 ml Omnipaque-300.  Additional Medications: Ciprofloxacin 400 mg IV.  Fluoroscopy Time: 1.9 minutes.  Procedure: The procedure, risks, benefits, and alternatives were  explained to the patient. Questions regarding the procedure were encouraged and answered. The patient understands and consents to the procedure.  The low back was prepped with betadine in a sterile fashion, and a sterile drape was applied covering the operative field. A sterile gown and sterile gloves were used for the procedure.  Under sonographic guidance, a 21 gauge needle was inserted into the lower pole calix of the left kidney.  Contrast was injected.  Was removed over a 0018 wire which was upsized to a three J.  10-French dilator followed by 10-French drain were inserted and coiled in the renal pelvis.  It was looped and string fixed.  Contrast was injected.  It was sewn to the skin.  The identical procedure was performed for the right kidney.  Findings: Bilateral lower pole renal collecting system access followed by bilateral percutaneous nephrostomy catheter placement into the renal pelvis bilaterally is documented.  Complications: None.  IMPRESSION: Bilateral percutaneous nephrostomy catheter placement.  Original Report Authenticated By: Donavan Burnet, M.D.   Ir US Guide Bx Asp/drain  06/14/2011  *RADIOLOGY REPORT*  Clinical Data/Indication: RENAL FAILURE.  URETERAL OBSTRUCTION.  PERC NEPHROSTOMY*R*,PERC NEPHROSTOMY*L*,IR ULTRASOUND GUIDANCE TISSUE ABLATION  Sedation: Versed 4 mg, Fentanyl 200 mg.  Total Moderate Sedation Time: 30 minutes.  Contrast Volume: 30 ml Omnipaque-300.  Additional Medications: Ciprofloxacin 400 mg IV.  Fluoroscopy Time: 1.9 minutes.  Procedure: The procedure, risks, benefits, and alternatives were explained to the patient. Questions regarding the procedure were encouraged and answered. The patient understands and consents to the procedure.  The low back was prepped with betadine in a sterile fashion, and a sterile drape was applied covering the operative field. A sterile gown and sterile gloves were used for the procedure.  Under sonographic guidance, a 21 gauge needle was  inserted into the lower pole calix of the left kidney.  Contrast was injected.  Was removed over a 0018 wire which was upsized to a three J.  10-French dilator followed by 10-French drain were inserted and coiled in the renal pelvis.  It was looped and string fixed.  Contrast was injected.  It was sewn to the skin.  The identical procedure was performed for the right kidney.  Findings: Bilateral lower pole renal collecting system access followed by bilateral percutaneous nephrostomy catheter placement into the renal pelvis bilaterally is documented.  Complications: None.  IMPRESSION: Bilateral percutaneous nephrostomy catheter placement.  Original Report Authenticated By: Donavan Burnet, M.D.   Ir Central Line Right  06/19/2011  *RADIOLOGY REPORT*  Clinical Data/Indication: Sarcoma  TUNNEL POWER PORT PLACEMENT WITH SUBCUTANEOUS POCKET UTILIZING ULTRASOUND & FLOUROSCOPY  Sedation: Versed 2 mg, Fentanyl 200 mcg.  Total Moderate Sedation Time: 60 minutes.  Vancomycin was given within two hours of incision.  Vancomycin was given due to an antibiotic allergy.  Fluoroscopy Time: 0.4 minutes.  Procedure:  After written informed consent was obtained, patient was placed in the supine position on angiographic table. The right neck and chest was prepped and draped in a sterile fashion. Lidocaine was utilized for local anesthesia.  The right internal jugular vein was noted to be patent initially with ultrasound. Under sonographic guidance, a micropuncture needle was inserted into the right IJ vein (Ultrasound and fluoroscopic image documentation was performed). The needle was removed over an 018 wire which was exchanged for a Amplatz.  This was advanced into the IVC.  An 8-French dilator was advanced over the Amplatz.  A small incision was made in the right upper chest over the anterior right second rib.  Utilizing blunt dissection, a subcutaneous pocket was created in the caudal direction. The pocket was irrigated with a  copious amount of sterile normal saline.  The port catheter was tunneled from the chest incision, and out the neck incision.  The reservoir was inserted into the subcutaneous pocket and secured with two 3-0 Ethilon stitches.  A peel-away sheath was advanced over the Amplatz wire.  The port catheter was cut to measure length and inserted through the peel-away sheath. The peel-away sheath was removed.  The chest incision was closed with 3-0 Vicryl interrupted stitches for the subcutaneous tissue and a running of 4-0 Vicryl subcuticular stitch for the skin.  The neck incision was closed with a 4-0 Vicryl subcuticular stitch. Derma-bond was applied to both surgical incisions.  The port reservoir was flushed and instilled with heparinized saline.  No complications.  Findings:  A right IJ vein Port-A-Cath is in place with its tip at the cavoatrial junction.  IMPRESSION: Successful 8 French right internal jugular vein power port placement with its tip at the SVC/RA junction.  Original Report Authenticated By: Donavan Burnet, M.D.     ASSESSMENT/PLAN: The patient was discussed with Dr. Darrold Span. Her counts are acceptable to proceed with chemotherapy as planned on 06/25/2011. To better address he pain control we will add Ultram 50 mg by mouth every 8 hours, reserving the dilaudid for breakthrough pain. She is asked to limit her salt intake and elevate her legs as much as possible. She will follow up lab appointments and with Dr. Darrold Span as previously scheduled.    Laural Benes, Kechia Yahnke E, PA-C     All questions were answered. The patient knows to call the clinic with any problems, questions or concerns. We can certainly see the patient much sooner if necessary.

## 2011-06-25 NOTE — Patient Instructions (Signed)
[facility] Discharge Instructions for Patients Receiving Chemotherapy  Today you received the following chemotherapy agents Gemzar.  To help prevent nausea and vomiting after your treatment, we encourage you to take your nausea medication as prescribed by your physician.  If you develop nausea and vomiting that is not controlled by your nausea medication, call the clinic. If it is after clinic hours your family physician or the after hours number for the clinic or go to the Emergency Department.   BELOW ARE SYMPTOMS THAT SHOULD BE REPORTED IMMEDIATELY:  *FEVER GREATER THAN 100.5 F  *CHILLS WITH OR WITHOUT FEVER  NAUSEA AND VOMITING THAT IS NOT CONTROLLED WITH YOUR NAUSEA MEDICATION  *UNUSUAL SHORTNESS OF BREATH  *UNUSUAL BRUISING OR BLEEDING  TENDERNESS IN MOUTH AND THROAT WITH OR WITHOUT PRESENCE OF ULCERS  *URINARY PROBLEMS  *BOWEL PROBLEMS  UNUSUAL RASH Items with * indicate a potential emergency and should be followed up as soon as possible.  One of the nurses will contact you 24 hours after your treatment. Please let the nurse know about any problems that you may have experienced. Feel free to call the clinic you have any questions or concerns. The clinic phone number is [phone].   I have been informed and understand all the instructions given to me. I know to contact the clinic, my physician, or go to the Emergency Department if any problems should occur. I do not have any questions at this time, but understand that I may call the clinic during office hours   should I have any questions or need assistance in obtaining follow up care.    __________________________________________  _____________  __________ Signature of Patient or Authorized Representative            Date                   Time    __________________________________________ Nurse's Signature

## 2011-06-25 NOTE — Progress Notes (Signed)
Patient received one prescription from [facility] on 06/22/11 $3.00,her remaning balance [facility] $347.90.

## 2011-06-25 NOTE — Progress Notes (Signed)
SPOKE WITH MS. Mayford Knife AND TOLD HER THAT  DR. Darrold Span WOULD LIKE HER TO DRINK GATORADE/ JUICE INSTEAD OF PLAIN WATER BECAUSE HER KCL LEIVEL IS JUST SLIGHTLY LOW ON 06-22-11 AT 3.4.  HER KIDNEY FUNCTION IS JUST ABOUT BACK TO NORMAL RANGE. WHILE DISCUSSING LABS WITH PT. IN THE INFUSION ROOM, SHE STATED THAT THE LEFT NEPHROSTOMY TUBE WAS LEAKING.  FORTUNATELY, [facility] IR NURSE CATHY WAS ABLE TO COME TO THE [facility] AND CHANGE THE PLASTIC ADAPTOR TUBE.  CALLED IN RF FOR DECADRON PRE MED AS PT. TOOK PRIOR TO CHEMO TODAY.  PT. TO RECEIVE TAXOTERE ON D 8 NOT D1.  TOLD HER TO STOP TAKING THE DECADRON.

## 2011-06-26 ENCOUNTER — Other Ambulatory Visit: Payer: Self-pay | Admitting: Certified Registered Nurse Anesthetist

## 2011-06-26 ENCOUNTER — Telehealth: Payer: Self-pay | Admitting: *Deleted

## 2011-06-26 NOTE — Telephone Encounter (Signed)
Message copied by Augusto Garbe on Tue Jun 26, 2011  2:59 PM ------      Message from: Charlette Caffey      Created: Mon Jun 25, 2011  3:28 PM      Regarding: Chemo follow up call       Dr. Darrold Span..............Marland KitchenGEMZAR

## 2011-06-26 NOTE — Telephone Encounter (Signed)
Patient is doing well.  Reports her stomach hurts is the only symptom currently.  Has not had a bm and has taken [employer] of magnesia with no results.  Asked tat she drink more fluids but says her potassium level is low and she was told to drink various juices especially O.J. And avoid too much water.  No questions or complaints at this time.

## 2011-06-27 ENCOUNTER — Telehealth: Payer: Self-pay

## 2011-06-27 NOTE — Telephone Encounter (Signed)
FAXED SIGNED ORDERS TO [facility] DATED 06-27-11.  SENT ORIGINALS TO MEDICAL RECORDS TO BE SCANNED INTON PT.S EMR.

## 2011-07-02 ENCOUNTER — Encounter: Payer: Self-pay | Admitting: Oncology

## 2011-07-02 ENCOUNTER — Ambulatory Visit (HOSPITAL_BASED_OUTPATIENT_CLINIC_OR_DEPARTMENT_OTHER): Payer: Self-pay

## 2011-07-02 ENCOUNTER — Other Ambulatory Visit: Payer: Self-pay

## 2011-07-02 ENCOUNTER — Ambulatory Visit (HOSPITAL_BASED_OUTPATIENT_CLINIC_OR_DEPARTMENT_OTHER): Payer: Self-pay | Admitting: Oncology

## 2011-07-02 ENCOUNTER — Other Ambulatory Visit (HOSPITAL_BASED_OUTPATIENT_CLINIC_OR_DEPARTMENT_OTHER): Payer: Self-pay | Admitting: Lab

## 2011-07-02 VITALS — BP 126/70 | HR 73 | Temp 98.5°F

## 2011-07-02 VITALS — BP 153/81 | HR 82 | Temp 97.2°F | Ht 66.0 in | Wt 226.8 lb

## 2011-07-02 DIAGNOSIS — C7800 Secondary malignant neoplasm of unspecified lung: Secondary | ICD-10-CM

## 2011-07-02 DIAGNOSIS — C55 Malignant neoplasm of uterus, part unspecified: Secondary | ICD-10-CM

## 2011-07-02 DIAGNOSIS — Z5111 Encounter for antineoplastic chemotherapy: Secondary | ICD-10-CM

## 2011-07-02 LAB — CBC WITH DIFFERENTIAL/PLATELET
BASO%: 0.1 % (ref 0.0–2.0)
EOS%: 0.1 % (ref 0.0–7.0)
MCHC: 33.8 g/dL (ref 31.5–36.0)
MONO#: 0.7 10*3/uL (ref 0.1–0.9)
RBC: 3.55 10*6/uL — ABNORMAL LOW (ref 3.70–5.45)
RDW: 15.6 % — ABNORMAL HIGH (ref 11.2–14.5)
WBC: 11.2 10*3/uL — ABNORMAL HIGH (ref 3.9–10.3)
lymph#: 1.6 10*3/uL (ref 0.9–3.3)
nRBC: 0 % (ref 0–0)

## 2011-07-02 LAB — BASIC METABOLIC PANEL
Chloride: 103 mEq/L (ref 96–112)
Potassium: 4.6 mEq/L (ref 3.5–5.3)

## 2011-07-02 MED ORDER — OXYCODONE-ACETAMINOPHEN 5-325 MG PO TABS: 1.0000 | ORAL_TABLET | Freq: Four times a day (QID) | ORAL | Status: DC | PRN

## 2011-07-02 MED ORDER — ONDANSETRON 16 MG/50ML IVPB (CHCC)
16.0000 mg | Freq: Once | INTRAVENOUS | Status: AC
  Administered 2011-07-02: 16 mg via INTRAVENOUS

## 2011-07-02 MED ORDER — SODIUM CHLORIDE 0.9 % IV SOLN
Freq: Once | INTRAVENOUS | Status: AC
  Administered 2011-07-02: 12:00:00 via INTRAVENOUS

## 2011-07-02 MED ORDER — DOCETAXEL CHEMO INJECTION 160 MG/16ML
100.0000 mg/m2 | Freq: Once | INTRAVENOUS | Status: AC
  Administered 2011-07-02: 230 mg via INTRAVENOUS
  Filled 2011-07-02: qty 23

## 2011-07-02 MED ORDER — SODIUM CHLORIDE 0.9 % IJ SOLN
10.0000 mL | INTRAMUSCULAR | Status: DC | PRN
  Administered 2011-07-02: 10 mL
  Filled 2011-07-02: qty 10

## 2011-07-02 MED ORDER — HEPARIN SOD (PORK) LOCK FLUSH 100 UNIT/ML IV SOLN
500.0000 [IU] | Freq: Once | INTRAVENOUS | Status: AC | PRN
  Administered 2011-07-02: 500 [IU]
  Filled 2011-07-02: qty 5

## 2011-07-02 MED ORDER — DEXAMETHASONE SODIUM PHOSPHATE 4 MG/ML IJ SOLN
20.0000 mg | Freq: Once | INTRAMUSCULAR | Status: AC
  Administered 2011-07-02: 20 mg via INTRAVENOUS

## 2011-07-02 MED ORDER — OXYCODONE-ACETAMINOPHEN 5-325 MG PO TABS
2.0000 | ORAL_TABLET | Freq: Once | ORAL | Status: AC
  Administered 2011-07-02: 2 via ORAL

## 2011-07-02 MED ORDER — TRAMADOL HCL 50 MG PO TABS: 100.0000 mg | ORAL_TABLET | Freq: Four times a day (QID) | ORAL | Status: DC | PRN

## 2011-07-02 MED ORDER — SODIUM CHLORIDE 0.9 % IV SOLN
900.0000 mg/m2 | Freq: Once | INTRAVENOUS | Status: AC
  Administered 2011-07-02: 2052 mg via INTRAVENOUS
  Filled 2011-07-02: qty 54

## 2011-07-02 NOTE — Patient Instructions (Signed)
Try increasing tramadol to 100 mg ( = 2 of the 50 mg tablets) every 6 hrs as needed for pain. You can use percocet in addition if needed. Call Dr.Asia Favata if this does not help enough.  Use benadryl (diphenhydramine) at bedtime (25 - 50 mg) for itching and to help sleep. Call if leg gets red/ hot/ itching is not controlled. You can repeat benadryl in 4 - 6 hrs if needed.

## 2011-07-02 NOTE — Progress Notes (Signed)
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 Situation was complicated by bilateral ureteral obstruction with acute renal failure for which she had bilateral PCNs placed by IR during hospital admission 2-26 thru 06-19-2011; renal function has improved back almost to baseline. She had day 1 cycle 1 gemzar/taxotere (with gemzar only) on 06-25-11 and is for day 8 gemzar + taxotere today, then neulasta 07-03-11. She has taken decadron 8 mg bid beginning yesterday and planned x 3 days for the taxotere. History is of heavy menstrual bleeding x years, then 4 months of continuous light vaginal bleeding fall 2012, and more recent constipation. She has also lost 50 lbs in past 8 months, which she states was intentional with diet changes. She had significant constipation in Jan 2013, followed by several days of pelvic pain after bowels finally moved. She presented to ED because of the pain, with CT AP 05-07-2011 in [facility] with 20 x 16.7 x 16 cm pelvic mass thought to be uterus, with second 10.3x10.7x8.8 cm mass adjacent, an 11 x 7.5 cm nodal mass encasing right iliac vessels and severe right hydronephrosis. She was referred to gyn oncology, seen by [facility] 05-11-11, then went to exploratory laparotomy with supracervical hysterectomy at [facility] by Dr.Wendy Nelly Rout on 05-22-11. At surgery she had a 24 cm uterus extending to bilateral pelvic sidewalls with an ~ 15 cm retroperitoneal mass. Pathology [id number]) showed multifocal leiomyosarcoma spanning 17 cm, involving uterus and right adnexa, + LVSI. Postoperatively she was tachycardic which improved with 2 units PRBCs; she also had CT angio chest done  during that hospitalization 05-24-11 which was negative for PE but did show multiple bilateral pulmonary nodules radiographically consistent with metastases. She was seen in follow up by Dr.Brewster at clinic on 05-31-2011, with increased swelling of right thigh and right back pain; venous doppler was negative for clot RLE. She was seen in new patient consultation by Dr Darrold Span on 06-12-2011, with creatinine up to 3.76, admitted, seen in hospital by urology and bilateral PCNs placed by IR which likely will be needed long term and which will be managed by IR. She also had PAC placed by IR. [facility] is assisting with PCN management at home.   Patient tolerated day 1 gemzar on 06-25-11 without difficulty, including no significant nausea, no taste changes and no apparent worsening of fatigue. She is less uncomfortable from the [facility] tho still very sensitive to any direct contact or movement there. I have given her a note for [facility] requesting thicker padding again (such as ABD pad) over usual dressings to PCNs. She is having good urine output from left, filling that bag at least 3 - 5 x in past 24 hrs; she has less output from right, as has been the case. She is keeping the sites dry and, per my conversation with IR now, should not try showering even with saran/towels protecting areas at least for next few months. She has had pain over ~ sacral area since last pm, which she rates 9 out of 10, unable to sleep with this, not better with tramadol 50 mg this am. We will increase tramadol to 100 mg q 6 hrs (from present 50 mg q 8 hr) and I have also given prescription for prn  percocet. LE swelling was much better over next few days after gemzar. She has had some redness and itching right shin and right medial thigh at night past ~ 2 nights, none during day. She has had toast, Ensure and OJ this am. She is pushing fluids with gatorade and juices. Otherwise last BM was 2 days ago, no fever, no increased  shortness of breath or cough, no bleeding, no leaking at [facility] now, sleeping poorly, no other new pain. No HA or sinus complaints. Remainder of 10 point Review of Systems negative/ unchanged.  Objective:  Vital signs in last 24 hours:  BP 153/81  Pulse 82  Temp(Src) 97.2 F (36.2 C) (Oral)  Ht 5\' 6"  (1.676 m)  Wt 226 lb 12.8 oz (102.876 kg)  BMI 36.61 kg/m2  LMP 04/09/2011 This weight is down  10 lbs from recorded weight 06-22-11. Alert, appropriate, follows discussion well, slowly ambulatory without assistance, looks moderately uncomfortable from back pain but overall better than when I had seen her last.   HEENT:mucous membranes moist, pharynx normal without lesions. No alopecia. PERRL. Neck supple, no JVD upright. Poor dentition (hich we have not yet been able to address) LymphaticsCervical, supraclavicular, and axillary nodes normal. Resp: clear to auscultation bilaterally and normal percussion bilaterally Back: LS area without rash or ecchymosis, not tender to palpation along spine. Bilateral PCNs with dry dressings, minimal erythema and crusting at sites, clear urine in both bags L>R Cardio: regular rate and rhythm ZO:XWRUE, soft, non-tender; bowel sounds normal; no masses,  no organomegaly Midline surgical incision closed, no erythema or tenderness. Extremities: no pitting edema bilateral LE. Minimal erythema anterior lower leg on right, not hot, no palpable rash, some excoriations right medial thigh without rash. LLE not remarkable. Neuro:CN, motor, sensory nonfocal Portacath without erythema or tenderness  Lab Results:   Basename 07/02/11 1019  WBC 11.2*  HGB 9.4*  HCT 27.8*  PLT 262  this hgb stable/ slightly improved ANC 8.9 BMET pending from today  Studies/Results: None new Medications: I have reviewed the patient's current medications. New prescriptions given for percocet 7.5/ 325 1-2 q 6 hr prn  # 30 and tramadol at increased dose of 100 mg q 6 hr # 120. She will  also try benadryl at hs for itching and to help sleep. Assessment/Plan: 1. 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 I will see her again at least 3-27 with cbc/cmet, or sooner if needed. If low back pain worsens will need imaging. Adjustments in pain meds as above. 2. 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 Appreciate assistance from IR and [facility] with ongoing management 3.Acute renal failure resolved with PCNs 4. PAC in 5.erythema and itching RLE: not clearly cellulitis. Patient instructed to call if symptoms worsen. 6.Morbid obesity: significant weight loss with this illness over past months. Dietician involved. 7.long tobacco recently DCd 8.anemia, in part iron deficiency, on supplements. Follow. Patient understood instructions and was in agreement with plan.  Daxen Lanum P, MD   07/02/2011, 10:58 AM

## 2011-07-03 ENCOUNTER — Telehealth: Payer: Self-pay

## 2011-07-03 ENCOUNTER — Ambulatory Visit (HOSPITAL_BASED_OUTPATIENT_CLINIC_OR_DEPARTMENT_OTHER): Payer: Self-pay

## 2011-07-03 ENCOUNTER — Encounter: Payer: Self-pay | Admitting: Oncology

## 2011-07-03 VITALS — BP 136/75 | HR 91 | Temp 97.5°F

## 2011-07-03 DIAGNOSIS — C55 Malignant neoplasm of uterus, part unspecified: Secondary | ICD-10-CM

## 2011-07-03 MED ORDER — PEGFILGRASTIM INJECTION 6 MG/0.6ML
6.0000 mg | Freq: Once | SUBCUTANEOUS | Status: AC
  Administered 2011-07-03: 6 mg via SUBCUTANEOUS
  Filled 2011-07-03: qty 0.6

## 2011-07-03 NOTE — Progress Notes (Signed)
Patient received one prescription from [place] op pharmacy on 07/02/11 $3.00,her remaninig balance [facility] $344.90.

## 2011-07-03 NOTE — Telephone Encounter (Signed)
LEFT A MESSAGE FOR MS. [place] THAT HER KCL LEVEL WAS OK YESTERDAY.  DR. Darrold Span SAID THAT SHE COULD DRINK MORE PLAIN WATER IF SHE WOULD LIKE.  HER KIDNEY FUNCTION IS BACK TO NORMAL.

## 2011-07-05 ENCOUNTER — Telehealth: Payer: Self-pay | Admitting: *Deleted

## 2011-07-05 ENCOUNTER — Encounter: Payer: Self-pay | Admitting: Oncology

## 2011-07-05 NOTE — Telephone Encounter (Signed)
Melanie RN called from patient home.  She is discharging patient today.  Has instructed patient family on how to do dressing changes.  Asked if Dr. Darrold Span wants [facility] to remain on board.  Family seems competent and they do not have insurance so melanie concerned about billing.  [facility] will be glad to care for patient and this can be re-ordered.  Will notify providers.  [facility] number per Shawna Orleans is [phone].

## 2011-07-05 NOTE — Progress Notes (Signed)
Patient received two prescriptions from [place] op pharmacy on 07/03/11 $9.79,her remaninig balance [facility] $335.11.

## 2011-07-06 ENCOUNTER — Telehealth: Payer: Self-pay

## 2011-07-06 NOTE — Telephone Encounter (Signed)
MELANIE STATED THAT Jackie Cook HAS SOM NEPHROSTOMY TUBE DSY SUPPLIES COMING TO HER FROM [facility].  SHE JUST NEEDS 4X4 GAUZE.  THIS CAN BE PURCHASED IN BULK  AT [facility]. THE BAGS FOR THE TUBES CAN BE GOTTEN FROM [facility] PER JENNIFER.  PT. USUALLY RECEIVES AN EXTRA BAG WHEN TUBES CHANGED. PT. WAS DISCHARGED YESTERDAY FROM [facility].  A SSW CONSULT WAS DONE ON BY [facility] ON 06-27-11 PER MELANIE.

## 2011-07-11 ENCOUNTER — Telehealth: Payer: Self-pay | Admitting: Oncology

## 2011-07-11 ENCOUNTER — Encounter: Payer: Self-pay | Admitting: Oncology

## 2011-07-11 ENCOUNTER — Other Ambulatory Visit (HOSPITAL_BASED_OUTPATIENT_CLINIC_OR_DEPARTMENT_OTHER): Payer: Self-pay | Admitting: Lab

## 2011-07-11 ENCOUNTER — Ambulatory Visit (HOSPITAL_BASED_OUTPATIENT_CLINIC_OR_DEPARTMENT_OTHER): Payer: Self-pay | Admitting: Oncology

## 2011-07-11 VITALS — BP 128/75 | HR 114 | Temp 99.1°F | Ht 66.0 in | Wt 223.6 lb

## 2011-07-11 DIAGNOSIS — C55 Malignant neoplasm of uterus, part unspecified: Secondary | ICD-10-CM

## 2011-07-11 LAB — COMPREHENSIVE METABOLIC PANEL
ALT: 14 U/L (ref 0–35)
Alkaline Phosphatase: 97 U/L (ref 39–117)
Creatinine, Ser: 1.07 mg/dL (ref 0.50–1.10)
Glucose, Bld: 113 mg/dL — ABNORMAL HIGH (ref 70–99)
Sodium: 139 mEq/L (ref 135–145)
Total Bilirubin: 0.5 mg/dL (ref 0.3–1.2)
Total Protein: 6.8 g/dL (ref 6.0–8.3)

## 2011-07-11 LAB — CBC WITH DIFFERENTIAL/PLATELET
BASO%: 0.1 % (ref 0.0–2.0)
LYMPH%: 8.4 % — ABNORMAL LOW (ref 14.0–49.7)
MCHC: 32.8 g/dL (ref 31.5–36.0)
MCV: 82.4 fL (ref 79.5–101.0)
MONO%: 1.9 % (ref 0.0–14.0)
NEUT%: 89.1 % — ABNORMAL HIGH (ref 38.4–76.8)
Platelets: 129 10*3/uL — ABNORMAL LOW (ref 145–400)
RBC: 3.63 10*6/uL — ABNORMAL LOW (ref 3.70–5.45)

## 2011-07-11 NOTE — Progress Notes (Signed)
OFFICE PROGRESS NOTE Date of Visit 07-11-2011 Physicians  INTERVAL HISTORY:  Patient is seen, together with mother, in continuing attention to her uterine leiomyosarcoma, having had first cycle of gemzar/taxotere on 3-11 and 07-02-2011 with neulasta on 3-19. History is of heavy menstrual bleeding x years, then 4 months of continuous light vaginal bleeding fall 2012, and more recent constipation. She has also lost 50 lbs in past 8 months, which she states was intentional with diet changes. She had significant constipation in Jan 2013, followed by several days of pelvic pain after bowels finally moved. She presented to ED because of the pain, with CT AP 05-07-2011 in [facility] system with 20 x 16.7 x 16 cm pelvic mass thought to be uterus, with second 10.3x10.7x8.8 cm mass adjacent, an 11 x 7.5 cm nodal mass encasing right iliac vessels and severe right hydronephrosis. She was referred to gyn oncology, seen by [facility] 05-11-11, then went to exploratory laparotomy with supracervical hysterectomy at [facility] by Dr.Wendy Nelly Rout on 05-22-11. At surgery she had a 24 cm uterus extending to bilateral pelvic sidewalls with an ~ 15 cm retroperitoneal mass. Pathology [id number]) showed multifocal leiomyosarcoma spanning 17 cm, involving uterus and right adnexa, + LVSI. Postoperatively she was tachycardic which improved with 2 units PRBCs; she also had CT angio chest done during that hospitalization 05-24-11 which was negative for PE but did show multiple bilateral pulmonary nodules radiographically consistent with metastases. She was seen in follow up by Dr.Brewster at clinic on 05-31-2011, with increased swelling of right thigh and right back pain; venous doppler was negative for clot RLE. She was seen in new patient consultation by Dr Darrold Span on 06-12-2011, with creatinine up to 3.76, admitted, seen in hospital by urology and bilateral PCNs placed by IR which likely will be needed long term and which will be managed by  IR. She also had PAC placed by IR. Patient has tolerated first cycle of chemotherapy generally well, tho she had 2 brief episodes of heavy central chest pressure with SOB evening of neulasta. This is not specifically described as pain, no clear neulasta pain elsewhere, no other chest discomfort or shortness of breath. Timing of this still suggests neulasta, however I have told her to go to ED if symptoms recur. She has had no nausea or vomiting and has needed no antiemetics at home. The Tramadol at 100 mg has been very helpful for PCN pain, however she has not needed this in at least 36 hrs. She has been eating and drinking fluids and bowels have moved, with multiple small formed BMs yesterday. She has some mid abdominal soreness which is persistent regardless of position, bowel movements and the Tramadol, not bothersome enough itself for pain medication. The puritis right lower leg has resolved, however she still has numbness and some itching left upper leg, which is likely neuropathic related to surgery. She has had no fever or symptoms of infection. The PCNs are draining well, left moreso than right as previously, no gross hematuria, much less uncomfortable than when I saw her last. [facility] is no longer following; she has plenty of supplies for dressings and IR will change tubes as appropriate. On Review of Systems otherwise: no HA, some nasal drainage consistent with environmental allergies and she can use claritin, no other bleeding, no LE swelling. Remainder of 10 point ROS negative/ unchanged. Objective:  Vital signs in last 24 hours:  BP 128/75  Pulse 114  Temp(Src) 99.1 F (37.3 C) (Oral)  Ht 5\' 6"  (  1.676 m)  Wt 223 lb 9.6 oz (101.424 kg)  BMI 36.09 kg/m2  LMP 04/09/2011. This weight is down 3 lbs. Alert, more easily ambulatory and looks more comfortable in exam room. HEENT:mucous membranes moist, pharynx normal without lesions.Very poor dentition. PERRL. LymphaticsCervical,  supraclavicular, and axillary nodes normal.No clear inguinal adenopathy Resp: clear to auscultation bilaterally and normal percussion bilaterally Cardio: regular rate and rhythm GI: soft, not discretely tender, some normal bowel sounds, not distended, no appreciable HSM or palpable mass. Midline incision healed. Extremities: extremities normal, atraumatic, no cyanosis or edema. No rash including LLE. Thigh not remarkable on left. Neuro:no sensory deficits noted PCNs dressed, no drainage, clear urine in both bags. Portacath without erythema or tenderness.  Lab Results:   Basename 07/11/11 1137  WBC 33.7*  HGB 9.8*  HCT 29.9*  PLT 129*  ANC 30 post neulasta. This Hgb is up from 9.4 on 3-18 and 9-2 on 3-11.  BMET  Basename 07/11/11 1137  NA 139  K 3.9  CL 103  CO2 29  GLUCOSE 113*  BUN 9  CREATININE 1.07  CALCIUM 9.4  remainder of full CMET available after visit WNL  Studies/Results:  No results found.  Medications: I have reviewed the patient's current medications. Patient will let us know if she does not have decadron for cycle 2 (8 mg bid with food x3 days beginning day prior to day 8).  Assessment/Plan: 1.Leiomyosarcoma of uterus extensive in pelvis and clinically metastatic to lung: she will be due cycle 2 gemzar on 4-1 and gemzar/taxotere 4-8 as long as ANC >=1.5 and plt >=100k each day (with decadron around day 8). She will have neulasta day after taxotere. I expect we will repeat scans after 3 cycles as long as she is clinically stable. I will see her on 4-15 or sooner if needed. 2.Acute renal failure related to bilateral ureteral obstruction: PCNs in place and creatinine back to normal. She understands that she probably will need the PCNs long term. 3.PAC in due to inadequate peripheral IV access. 4.Anemia due to gyn and urologic bleeding and chemo: on oral iron, hemoglobin better.  Patient had questions answered to her satisfaction and was in agreement with plan as  above. Jackie Cook P, MD   07/11/2011, 9:35 PM

## 2011-07-11 NOTE — Patient Instructions (Signed)
Decadron (dexamethasone, steroid) 4 mg.   2 tablets (= 8 mg) WITH FOOD morning and evening for 3 days beginning Sun April 7 ( So April 7-8-9)

## 2011-07-11 NOTE — Telephone Encounter (Signed)
gv pt appt schedule for April/may. °

## 2011-07-13 ENCOUNTER — Other Ambulatory Visit: Payer: Self-pay | Admitting: Oncology

## 2011-07-16 ENCOUNTER — Ambulatory Visit (HOSPITAL_BASED_OUTPATIENT_CLINIC_OR_DEPARTMENT_OTHER): Payer: Self-pay

## 2011-07-16 ENCOUNTER — Other Ambulatory Visit (HOSPITAL_BASED_OUTPATIENT_CLINIC_OR_DEPARTMENT_OTHER): Payer: Self-pay

## 2011-07-16 VITALS — BP 125/65 | HR 88 | Temp 97.4°F

## 2011-07-16 DIAGNOSIS — C55 Malignant neoplasm of uterus, part unspecified: Secondary | ICD-10-CM

## 2011-07-16 DIAGNOSIS — Z5111 Encounter for antineoplastic chemotherapy: Secondary | ICD-10-CM

## 2011-07-16 LAB — CBC WITH DIFFERENTIAL/PLATELET
Basophils Absolute: 0.2 10*3/uL — ABNORMAL HIGH (ref 0.0–0.1)
HCT: 28.5 % — ABNORMAL LOW (ref 34.8–46.6)
HGB: 9.3 g/dL — ABNORMAL LOW (ref 11.6–15.9)
MONO#: 1.6 10*3/uL — ABNORMAL HIGH (ref 0.1–0.9)
NEUT%: 82.8 % — ABNORMAL HIGH (ref 38.4–76.8)
WBC: 27 10*3/uL — ABNORMAL HIGH (ref 3.9–10.3)
lymph#: 2.8 10*3/uL (ref 0.9–3.3)

## 2011-07-16 MED ORDER — SODIUM CHLORIDE 0.9 % IV SOLN
Freq: Once | INTRAVENOUS | Status: AC
  Administered 2011-07-16: 10:00:00 via INTRAVENOUS

## 2011-07-16 MED ORDER — DEXAMETHASONE SODIUM PHOSPHATE 10 MG/ML IJ SOLN
10.0000 mg | Freq: Once | INTRAMUSCULAR | Status: AC
  Administered 2011-07-16: 10 mg via INTRAVENOUS

## 2011-07-16 MED ORDER — ONDANSETRON 8 MG/50ML IVPB (CHCC)
8.0000 mg | Freq: Once | INTRAVENOUS | Status: AC
  Administered 2011-07-16: 8 mg via INTRAVENOUS

## 2011-07-16 MED ORDER — SODIUM CHLORIDE 0.9 % IV SOLN
900.0000 mg/m2 | Freq: Once | INTRAVENOUS | Status: AC
  Administered 2011-07-16: 2052 mg via INTRAVENOUS
  Filled 2011-07-16: qty 54

## 2011-07-23 ENCOUNTER — Ambulatory Visit (HOSPITAL_BASED_OUTPATIENT_CLINIC_OR_DEPARTMENT_OTHER): Payer: Self-pay

## 2011-07-23 ENCOUNTER — Other Ambulatory Visit (HOSPITAL_BASED_OUTPATIENT_CLINIC_OR_DEPARTMENT_OTHER): Payer: Self-pay | Admitting: Lab

## 2011-07-23 VITALS — BP 110/66 | HR 84 | Temp 97.9°F

## 2011-07-23 DIAGNOSIS — C55 Malignant neoplasm of uterus, part unspecified: Secondary | ICD-10-CM

## 2011-07-23 LAB — CBC WITH DIFFERENTIAL/PLATELET
Basophils Absolute: 0 10*3/uL (ref 0.0–0.1)
Eosinophils Absolute: 0 10*3/uL (ref 0.0–0.5)
HGB: 8.5 g/dL — ABNORMAL LOW (ref 11.6–15.9)
LYMPH%: 11.6 % — ABNORMAL LOW (ref 14.0–49.7)
MCV: 79.9 fL (ref 79.5–101.0)
MONO%: 6 % (ref 0.0–14.0)
NEUT#: 11.7 10*3/uL — ABNORMAL HIGH (ref 1.5–6.5)
Platelets: 405 10*3/uL — ABNORMAL HIGH (ref 145–400)

## 2011-07-23 MED ORDER — DEXAMETHASONE SODIUM PHOSPHATE 4 MG/ML IJ SOLN
20.0000 mg | Freq: Once | INTRAMUSCULAR | Status: AC
  Administered 2011-07-23: 20 mg via INTRAVENOUS

## 2011-07-23 MED ORDER — GEMCITABINE HCL CHEMO INJECTION 1 GM
900.0000 mg/m2 | Freq: Once | INTRAVENOUS | Status: AC
  Administered 2011-07-23: 2052 mg via INTRAVENOUS
  Filled 2011-07-23: qty 54

## 2011-07-23 MED ORDER — HEPARIN SOD (PORK) LOCK FLUSH 100 UNIT/ML IV SOLN
500.0000 [IU] | Freq: Once | INTRAVENOUS | Status: AC | PRN
  Administered 2011-07-23: 500 [IU]
  Filled 2011-07-23: qty 5

## 2011-07-23 MED ORDER — SODIUM CHLORIDE 0.9 % IV SOLN
Freq: Once | INTRAVENOUS | Status: AC
  Administered 2011-07-23: 13:00:00 via INTRAVENOUS

## 2011-07-23 MED ORDER — ALTEPLASE 2 MG IJ SOLR
2.0000 mg | Freq: Once | INTRAMUSCULAR | Status: DC | PRN
  Filled 2011-07-23: qty 2

## 2011-07-23 MED ORDER — SODIUM CHLORIDE 0.9 % IJ SOLN
3.0000 mL | INTRAMUSCULAR | Status: DC | PRN
  Filled 2011-07-23: qty 10

## 2011-07-23 MED ORDER — SODIUM CHLORIDE 0.9 % IJ SOLN
10.0000 mL | INTRAMUSCULAR | Status: DC | PRN
  Administered 2011-07-23: 10 mL
  Filled 2011-07-23: qty 10

## 2011-07-23 MED ORDER — ONDANSETRON 16 MG/50ML IVPB (CHCC)
16.0000 mg | Freq: Once | INTRAVENOUS | Status: AC
  Administered 2011-07-23: 16 mg via INTRAVENOUS

## 2011-07-23 MED ORDER — DEXTROSE 5 % IV SOLN
100.0000 mg/m2 | Freq: Once | INTRAVENOUS | Status: AC
  Administered 2011-07-23: 230 mg via INTRAVENOUS
  Filled 2011-07-23: qty 23

## 2011-07-24 ENCOUNTER — Other Ambulatory Visit: Payer: Self-pay | Admitting: Oncology

## 2011-07-24 ENCOUNTER — Ambulatory Visit: Payer: Self-pay

## 2011-07-24 ENCOUNTER — Ambulatory Visit (HOSPITAL_BASED_OUTPATIENT_CLINIC_OR_DEPARTMENT_OTHER): Payer: Self-pay

## 2011-07-24 VITALS — BP 103/57 | HR 72 | Temp 97.5°F

## 2011-07-24 DIAGNOSIS — C55 Malignant neoplasm of uterus, part unspecified: Secondary | ICD-10-CM

## 2011-07-24 MED ORDER — PEGFILGRASTIM INJECTION 6 MG/0.6ML
6.0000 mg | Freq: Once | SUBCUTANEOUS | Status: AC
  Administered 2011-07-24: 6 mg via SUBCUTANEOUS

## 2011-07-24 NOTE — Patient Instructions (Signed)
Pt in for neulasta.  Pt stated last time she had the injection it caused some pain in her chest localized to sternum.  Pt told her doctor at last appointment.  Pt instructed to call if this happens and is associated with radiating pain in the back, arms, neck, or jaw. She was also educated on other s/s of a heart attack.  Pt stated that it was not prolonged and verbalized understanding on when to call or come in to ER related to this issue.  Pt discharged home after injection ambulatory.

## 2011-07-24 NOTE — Progress Notes (Signed)
Late entry for 07/23/11: Ok per Dr. Cyndie Chime to treat despite lab values (hgb).

## 2011-07-24 NOTE — Progress Notes (Signed)
Hgb = 8.5; called patient to assess; patient received Neulasta injection this morning; patient reports no shortness of breath; some tiredness; told patient to report any increased symptoms; verbalized understanding.

## 2011-07-25 ENCOUNTER — Ambulatory Visit (HOSPITAL_COMMUNITY)
Admission: RE | Admit: 2011-07-25 | Discharge: 2011-07-25 | Disposition: A | Discharge status: Home / Self Care | Payer: Self-pay | Source: Ambulatory Visit | Attending: Urology | Admitting: Urology

## 2011-07-25 ENCOUNTER — Other Ambulatory Visit: Payer: Self-pay | Admitting: Urology

## 2011-07-25 ENCOUNTER — Telehealth: Payer: Self-pay | Admitting: Oncology

## 2011-07-25 ENCOUNTER — Other Ambulatory Visit: Payer: Self-pay

## 2011-07-25 ENCOUNTER — Telehealth: Payer: Self-pay

## 2011-07-25 DIAGNOSIS — N135 Crossing vessel and stricture of ureter without hydronephrosis: Secondary | ICD-10-CM

## 2011-07-25 DIAGNOSIS — N1330 Unspecified hydronephrosis: Secondary | ICD-10-CM | POA: Insufficient documentation

## 2011-07-25 DIAGNOSIS — Z436 Encounter for attention to other artificial openings of urinary tract: Secondary | ICD-10-CM | POA: Insufficient documentation

## 2011-07-25 DIAGNOSIS — R109 Unspecified abdominal pain: Secondary | ICD-10-CM | POA: Insufficient documentation

## 2011-07-25 DIAGNOSIS — C55 Malignant neoplasm of uterus, part unspecified: Secondary | ICD-10-CM | POA: Insufficient documentation

## 2011-07-25 MED ORDER — LIDOCAINE HCL 1 % IJ SOLN
INTRAMUSCULAR | Status: AC
  Filled 2011-07-25: qty 20

## 2011-07-25 NOTE — Progress Notes (Signed)
ENCOUNTER OPENED IN ERROR

## 2011-07-25 NOTE — Telephone Encounter (Signed)
S/w the pt and she is aware of her lab appt on 08/13/2011@9 :00am

## 2011-07-25 NOTE — Telephone Encounter (Signed)
MS. Jackie Cook CALLING STATING THAT HER LEFT NEPHROSTOMY TUBE IS PAINFUL AND SHE CANNOT LAY ON HER LEFT SIDE DUE TO DISCOMFORT.  THE TUBE HAS NOT DRAINED IN OVER 12 HOURS. PT. IS TO GO TO [facility] IR FOR 10 AM TO HAVE TUBE EVALUATED PER DR. Darrold Span. IR AWARE PT. COMING IN THIS AM. IR CALLED BACK AND IT IS TIME FOR TUBE TO BE EXCHANGED.  THIS WILL BE DONE TODAY.

## 2011-07-25 NOTE — Procedures (Signed)
Exchange of bilat 88F perc nephrostomies No complication No blood loss. See complete dictation in [facility].

## 2011-07-30 ENCOUNTER — Other Ambulatory Visit (HOSPITAL_BASED_OUTPATIENT_CLINIC_OR_DEPARTMENT_OTHER): Payer: Self-pay | Admitting: Lab

## 2011-07-30 ENCOUNTER — Ambulatory Visit (HOSPITAL_BASED_OUTPATIENT_CLINIC_OR_DEPARTMENT_OTHER): Payer: Self-pay | Admitting: Oncology

## 2011-07-30 ENCOUNTER — Other Ambulatory Visit: Payer: Self-pay

## 2011-07-30 ENCOUNTER — Other Ambulatory Visit: Payer: Self-pay | Admitting: Certified Registered Nurse Anesthetist

## 2011-07-30 ENCOUNTER — Encounter: Payer: Self-pay | Admitting: Oncology

## 2011-07-30 ENCOUNTER — Telehealth: Payer: Self-pay | Admitting: Oncology

## 2011-07-30 VITALS — BP 119/64 | HR 107 | Temp 98.7°F | Ht 66.0 in | Wt 231.8 lb

## 2011-07-30 DIAGNOSIS — C55 Malignant neoplasm of uterus, part unspecified: Secondary | ICD-10-CM

## 2011-07-30 DIAGNOSIS — C7800 Secondary malignant neoplasm of unspecified lung: Secondary | ICD-10-CM

## 2011-07-30 DIAGNOSIS — D509 Iron deficiency anemia, unspecified: Secondary | ICD-10-CM

## 2011-07-30 LAB — CBC WITH DIFFERENTIAL/PLATELET
Basophils Absolute: 0 10*3/uL (ref 0.0–0.1)
EOS%: 0.2 % (ref 0.0–7.0)
Eosinophils Absolute: 0 10*3/uL (ref 0.0–0.5)
HCT: 24.4 % — ABNORMAL LOW (ref 34.8–46.6)
HGB: 8 g/dL — ABNORMAL LOW (ref 11.6–15.9)
MCH: 27.4 pg (ref 25.1–34.0)
MCV: 83.6 fL (ref 79.5–101.0)
MONO%: 5.3 % (ref 0.0–14.0)
NEUT#: 15.1 10*3/uL — ABNORMAL HIGH (ref 1.5–6.5)
NEUT%: 81.4 % — ABNORMAL HIGH (ref 38.4–76.8)
RDW: 19.7 % — ABNORMAL HIGH (ref 11.2–14.5)
lymph#: 2.4 10*3/uL (ref 0.9–3.3)

## 2011-07-30 MED ORDER — OXYCODONE-ACETAMINOPHEN 5-325 MG PO TABS: 1.0000 | ORAL_TABLET | Freq: Four times a day (QID) | ORAL | Status: DC | PRN

## 2011-07-30 MED ORDER — PANTOPRAZOLE SODIUM 40 MG PO TBEC: 40.0000 mg | DELAYED_RELEASE_TABLET | Freq: Every day | ORAL | Status: DC

## 2011-07-30 MED ORDER — DEXAMETHASONE 4 MG PO TABS: 8.0000 mg | ORAL_TABLET | Freq: Two times a day (BID) | ORAL | Status: DC

## 2011-07-30 NOTE — Progress Notes (Signed)
OFFICE PROGRESS NOTE Date of Visit 07-30-2011 Physicians: D.ClarkePearson/ W.Brewster, S.Dahlstedt. Interventional Radiology  INTERVAL HISTORY:  Patient is seen, alone for visit, in continuing attention to her extensive uterine leiomyosarcoma, for which she had surgery in early Feb 2013 and second cycle of gemzar/taxotere given 4-1 and 07-23-2011 with neulasta 07-24-2011.  History is of heavy menstrual bleeding x years, then 4 months of continuous light vaginal bleeding fall 2012, and more recent constipation. She had also lost 50 lbs in 8 months, which was reportedly intentional. She presented to ED with pain, with CT AP 05-07-2011 in [facility] with 20 x 16.7 x 16 cm pelvic mass thought to be uterus, with second 10.3x10.7x8.8 cm mass adjacent, an 11 x 7.5 cm nodal mass encasing right iliac vessels and severe right hydronephrosis. She was referred to gyn oncology, seen by [facility] 05-11-11, then went to exploratory laparotomy with supracervical hysterectomy at [facility] by Dr.Wendy Nelly Rout on 05-22-11. At surgery she had a 24 cm uterus extending to bilateral pelvic sidewalls with an ~ 15 cm retroperitoneal mass. Pathology [phone]) showed multifocal leiomyosarcoma spanning 17 cm, involving uterus and right adnexa, + LVSI. Postoperatively she was tachycardic which improved with 2 units PRBCs; she also had CT angio chest done during that hospitalization 05-24-11 which was negative for PE but did show multiple bilateral pulmonary nodules radiographically consistent with metastases. She was seen in follow up by Dr.Brewster at clinic on 05-31-2011, with increased swelling of right thigh and right back pain; venous doppler was negative for clot RLE. She was seen in new patient consultation by Dr Darrold Span on 06-12-2011, with creatinine up to 3.76, admitted, seen in hospital by urology and bilateral PCNs placed by IR which likely will be needed long term and which will be managed by IR. She had PAC placed by  IR.  Patient had nephrostomy tubes changed 07-25-11, after she called this office complaining of pain at left nephrostomy site and no urine output on that side x > 12 hrs.  At recent nephrostomy change a different plastic clip was used to secure tubing. She has been very uncomfortable especially at right nephrostomy insertion since 07-28-11, which may be related to the new clip. We have contacted IR and they will see her after my visit today. She is having good urine output L>R as has been case, nonbloody. She has had no fever. Pain meds are not helping this discomfort and she was not able to sleep last pm due to discomfort at nephrostomy sites.  She is also complaining of mid abdominal pain after eating, onset a few days ago. She is having no vomiting and bowels have been moving daily. She ate one HotPocket earlier today and has the midabdominal discomfort now. She is not aware of increased GERD, has used oral decadron with taxotere. Otherwise she denies shortness of breath or significant cough, no bladder discomfort, no bleeding. She had no chest pain or severe aches otherwise after most recent neulasta. She has had no fever or clear symptoms of infection. Remainder of 10 point Review of Systems negative/ unchanged. Objective:  Vital signs in last 24 hours:  BP 119/64  Pulse 107  Temp(Src) 98.7 F (37.1 C) (Oral)  Ht 5\' 6"  (1.676 m)  Wt 231 lb 12.8 oz (105.144 kg)  BMI 37.41 kg/m2  LMP 04/09/2011 Weight is up 8 lbs from 07-11-11. Alert, looks moderately uncomfortable which seems primarily from right nephrostomy, ambulatory without assistance.  HEENT:mucous membranes moist, pharynx normal without lesions. Extremely poor dentition.  PERRL, not icteric.  LymphaticsCervical, supraclavicular, and axillary nodes normal. Resp: clear to auscultation bilaterally and normal percussion bilaterally Cardio: regular rate and rhythm, clear heart sounds GI: bowel sounds diminished but otherwise normal. Not  distended. Soft. Not tender in epigastrium.,and no localized area of tenderness otherwise, tho she indicates discomfort across all of mid abdomen. Surgical incision healed. No inguinal adenopathy appreciated. Nephrostomy sites dressed. Right has 1 cm area of purulent material on dressing, no drainage from the site now, tubing pulled tightly laterally with the clip, no bleeding, no surrounding erythema. Left tube with no drainage/bleeding/erythema and not pulled quite as tightly with clip. Extremities: extremities normal, atraumatic, no cyanosis or edema Neuro:decreased senstation right anterior thigh as she has had since surgery.  Portacath without erythema or tenderness.  Lab Results:   Basename 07/30/11 1238  WBC 18.5*  HGB 8.0*  HCT 24.4*  PLT 120*   ANC 15.1 (WBC/ANC up post neulasta 4-9) HGB is down from 8.5 last week and 9.3 2 wks ago. She is on oral iron, did have low iron studies in Feb with hx heavy gyn bleeding. BMET No results found for this basename: NA:2,K:2,CL:2,CO2:2,GLUCOSE:2,BUN:2,CREATININE:2,CALCIUM:2 in the last 72 hours  Studies/Results:  No results found.  Medications: I have reviewed the patient's current medications. We have discussed using IV iron and will set that up with day 1 cycle 3 chemo (feraheme or IV iron dextran, message sent to managed care ? which is preferrable as she does not have insurance). Will add protonix 40 mg daily in case any gastric irritation. She will call if vomiting, worse abdominal pain or no bowel movements.   Assessment/Plan: 1. 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 She will see PA on 5-3 to check labs, then have CT chest~ May 6 and see me at least May 10. 2.Bilateral ureteral obstruction with bilateral percutaneous nephrostomy tubes: appreciate help from IR with local  discomfort possibly due to clips today. 3. PAC in 4. Anemia : may need PRBCs if < 8 or symptoms. This is multifactorial including iron deficiency from gyn bleeding, chemo, chronic disease. IV iron with day 1 chemo 08-06-11. 5. Extremely poor dentition 6.tobacco abuse recently DC'd 7. Mid abdominal pain etiology not clear. Try protonix. May need scans earlier if symptoms do not approve. 8. Unable to sleep due to nephrostomy tube pain. Hopefully this will improve when pain improves.  Lory Galan P, MD   07/30/2011, 2:26 PM

## 2011-07-30 NOTE — Patient Instructions (Signed)
Interventional Radiology to see you for the stent discomfort when you leave this office today.

## 2011-07-30 NOTE — Telephone Encounter (Signed)
appts made and printed for pt aom °

## 2011-07-30 NOTE — Telephone Encounter (Signed)
PERCOCET ORDERED WAS ACTUALL 40 TABLETS NOT 30 AS WRIITEN IN COMPUTER REFILL DOCUMENTATION.

## 2011-07-31 ENCOUNTER — Encounter: Payer: Self-pay | Admitting: Oncology

## 2011-07-31 NOTE — Progress Notes (Signed)
Patient received two prescriptions from [facility] on 07/30/11 $6.70,her remaninig balance [facility] $328.41.

## 2011-08-01 ENCOUNTER — Other Ambulatory Visit: Payer: Self-pay | Admitting: Oncology

## 2011-08-01 ENCOUNTER — Telehealth: Payer: Self-pay

## 2011-08-01 ENCOUNTER — Other Ambulatory Visit: Payer: Self-pay

## 2011-08-01 ENCOUNTER — Ambulatory Visit (HOSPITAL_COMMUNITY)
Admission: RE | Admit: 2011-08-01 | Discharge: 2011-08-01 | Disposition: A | Discharge status: Home / Self Care | Payer: Self-pay | Source: Ambulatory Visit | Attending: Oncology | Admitting: Oncology

## 2011-08-01 DIAGNOSIS — D509 Iron deficiency anemia, unspecified: Secondary | ICD-10-CM

## 2011-08-01 DIAGNOSIS — C55 Malignant neoplasm of uterus, part unspecified: Secondary | ICD-10-CM

## 2011-08-01 DIAGNOSIS — Z936 Other artificial openings of urinary tract status: Secondary | ICD-10-CM | POA: Insufficient documentation

## 2011-08-01 DIAGNOSIS — R109 Unspecified abdominal pain: Secondary | ICD-10-CM | POA: Insufficient documentation

## 2011-08-01 MED ORDER — LORAZEPAM 0.5 MG PO TABS: ORAL_TABLET | ORAL | Status: DC

## 2011-08-01 NOTE — Telephone Encounter (Signed)
Received message from pt stating that she did not receive the medication for her stomach after visit on 07/30/11, and also wants to know could she have something for sleep.  Called pt back, and she states she did not receive protonix.  Informed pt this should have been sent to her pharmacy, and this RN will call to confirm.  Pt also wants to know if she could take something for sleep.  Pt states she has not been able to sleep well d/t her stomach pain, and the percocet that she takes does not make her drowsy like it used to.  Pt states she has not tried anything OTC for sleep, because she has been scared to take anything with her cancer and kidneys.  Informed pt will leave note for MD review, and office will call her back.  She verbalizes understanding.

## 2011-08-01 NOTE — Telephone Encounter (Signed)
Called pt to inform her per Dr. Darrold Span, the abdominal x-ray did not show anything bad, but she does have a lot of stool, so constipation may be causing some of her discomfort, and she might want to increase her laxatives.  Pt verbalizes understanding, states she just had a nice size BM, last one was Monday.  Pt states she takes 200 mg colace daily, and alternates b/t miralax and milk of magnesia daily.  Informed pt if she has to increase her pain medication, to stay on top of this, as narcotics can increase constipation.  Pt verbalizes understanding.

## 2011-08-01 NOTE — Telephone Encounter (Signed)
Called pt to inform her per Dr. Darrold Span, new prescription called in for ativan, which she can use to help her sleep at night, pt can take the percocet up to 2 tabs q4-6h prn, may use tramadol as prescribed in addition to this, be sure to start protonix today, and come to [facility] for abdominal xray today, with office to call her when results received. Pt verbalizes understanding.  Pt also states she has had about 3-4 nosebleeds since starting chemo, and wanted to know if this was a side effect of her receiving chemo.  Informed her will discuss with MD, and office will let her know.

## 2011-08-02 ENCOUNTER — Encounter: Payer: Self-pay | Admitting: Oncology

## 2011-08-02 NOTE — Progress Notes (Signed)
I spoke with Joycie Peek at [employer] her # [phone] and she said all she need to know if she is married or not,she said she has been trying to call her,but the patient want return her call.

## 2011-08-02 NOTE — Progress Notes (Signed)
I Called Ms. Filion to discussed income information for the drug replacement program. Advised Ms. Viegas in order for [place] to enroll her in the drug replacement program we will need her current income information. Ms. Gail stated 2001 was the last time she filed her taxes. I advised Ms. Najera to contact the IRS for a statement or letter showing her last filing date. She also provided me with her caseworker name and number which is Gwenith Daily [phone]. I called Ms. Headen to check on her Medicaid status. No answer, left message to call me back. I forwarded the information to [place] and Meriam Sprague just an [place].

## 2011-08-03 ENCOUNTER — Encounter: Payer: Self-pay | Admitting: Oncology

## 2011-08-03 ENCOUNTER — Telehealth: Payer: Self-pay

## 2011-08-03 ENCOUNTER — Other Ambulatory Visit: Payer: Self-pay | Admitting: Oncology

## 2011-08-03 DIAGNOSIS — C55 Malignant neoplasm of uterus, part unspecified: Secondary | ICD-10-CM

## 2011-08-03 NOTE — Progress Notes (Signed)
Patient received two prescriptions from [facility] on 08/02/11 $6.23,her remaninig balance [facility] $322.18.

## 2011-08-03 NOTE — Telephone Encounter (Signed)
Called pt to inform her per Dr. Darrold Span, pt can add ex-lax to the miralax and milk of magnesia, but she still needs to continue the miralax and milk of magnesia daily or bid in addition to this.  Pt verbalizes understanding.

## 2011-08-03 NOTE — Telephone Encounter (Signed)
Called pt to inform her per Dr. Darrold Span, she had added a CT scan of abdomen/pelvis to her CT chest planned for  08/20/11, so pt will need to pick up oral contrast from scheduling when she comes in 08/06/11 for treatment.  Pt verbalizes understanding.  Also, asked pt how she is doing with bowels/pain/sleeping, per MD.  Pt states her bowels are "moving good."  Pt states she had several BM's yesterday and 1 or 2 on Wednesday, has not gone today so far.  Pt states she is still using her 200 mg colace, and alternates milk of magnesia with miralax daily.  Pt states the milk of magnesia and miralax do take a while to work, and she wanted to know could she try ex-lax instead.  Informed her will check with MD and let her know.  Pt also states her abdominal pain "is still there, but is not as bad."  Pt states the pain is "right where the incision starts from her hysterectomy, down to my belly button."  Pt describes the pain as "if my incision is having a stomach ache."  Pt states she has started the protonix, and if that does not help the pain, she will take 1 percocet, which takes about an hour to work, but does help the pain.  Pt states she does not take this often, and uses it instead of the tramadol, b/c it helps the pain better.  Pt states she is sleeping now, after taking the ativan.  This informatio to MD for review.

## 2011-08-05 ENCOUNTER — Other Ambulatory Visit: Payer: Self-pay | Admitting: Oncology

## 2011-08-06 ENCOUNTER — Ambulatory Visit (HOSPITAL_BASED_OUTPATIENT_CLINIC_OR_DEPARTMENT_OTHER): Payer: Self-pay

## 2011-08-06 ENCOUNTER — Encounter: Payer: Self-pay | Admitting: Oncology

## 2011-08-06 ENCOUNTER — Telehealth: Payer: Self-pay | Admitting: Internal Medicine

## 2011-08-06 ENCOUNTER — Other Ambulatory Visit (HOSPITAL_BASED_OUTPATIENT_CLINIC_OR_DEPARTMENT_OTHER): Payer: Self-pay | Admitting: Lab

## 2011-08-06 VITALS — BP 119/68 | HR 90 | Temp 98.1°F

## 2011-08-06 DIAGNOSIS — C55 Malignant neoplasm of uterus, part unspecified: Secondary | ICD-10-CM

## 2011-08-06 DIAGNOSIS — C7800 Secondary malignant neoplasm of unspecified lung: Secondary | ICD-10-CM

## 2011-08-06 DIAGNOSIS — Z5111 Encounter for antineoplastic chemotherapy: Secondary | ICD-10-CM

## 2011-08-06 DIAGNOSIS — D509 Iron deficiency anemia, unspecified: Secondary | ICD-10-CM

## 2011-08-06 LAB — COMPREHENSIVE METABOLIC PANEL
ALT: 8 U/L (ref 0–35)
BUN: 8 mg/dL (ref 6–23)
CO2: 23 mEq/L (ref 19–32)
Calcium: 8.8 mg/dL (ref 8.4–10.5)
Chloride: 111 mEq/L (ref 96–112)
Creatinine, Ser: 0.88 mg/dL (ref 0.50–1.10)
Glucose, Bld: 104 mg/dL — ABNORMAL HIGH (ref 70–99)
Total Bilirubin: 0.4 mg/dL (ref 0.3–1.2)

## 2011-08-06 LAB — CBC WITH DIFFERENTIAL/PLATELET
Eosinophils Absolute: 0.3 10*3/uL (ref 0.0–0.5)
HCT: 26.7 % — ABNORMAL LOW (ref 34.8–46.6)
LYMPH%: 12.7 % — ABNORMAL LOW (ref 14.0–49.7)
MCV: 84 fL (ref 79.5–101.0)
MONO#: 2 10*3/uL — ABNORMAL HIGH (ref 0.1–0.9)
MONO%: 8.7 % (ref 0.0–14.0)
NEUT#: 17.2 10*3/uL — ABNORMAL HIGH (ref 1.5–6.5)
NEUT%: 76.7 % (ref 38.4–76.8)
Platelets: 617 10*3/uL — ABNORMAL HIGH (ref 145–400)
RBC: 3.18 10*6/uL — ABNORMAL LOW (ref 3.70–5.45)
WBC: 22.4 10*3/uL — ABNORMAL HIGH (ref 3.9–10.3)

## 2011-08-06 MED ORDER — ONDANSETRON 8 MG/50ML IVPB (CHCC)
8.0000 mg | Freq: Once | INTRAVENOUS | Status: AC
  Administered 2011-08-06: 8 mg via INTRAVENOUS

## 2011-08-06 MED ORDER — SODIUM CHLORIDE 0.9 % IV SOLN
900.0000 mg/m2 | Freq: Once | INTRAVENOUS | Status: AC
  Administered 2011-08-06: 2052 mg via INTRAVENOUS
  Filled 2011-08-06: qty 54

## 2011-08-06 MED ORDER — SODIUM CHLORIDE 0.9 % IJ SOLN
10.0000 mL | INTRAMUSCULAR | Status: DC | PRN
  Administered 2011-08-06: 10 mL
  Filled 2011-08-06: qty 10

## 2011-08-06 MED ORDER — DEXAMETHASONE SODIUM PHOSPHATE 10 MG/ML IJ SOLN
10.0000 mg | Freq: Once | INTRAMUSCULAR | Status: AC
  Administered 2011-08-06: 10 mg via INTRAVENOUS

## 2011-08-06 MED ORDER — SODIUM CHLORIDE 0.9 % IV SOLN
1020.0000 mg | Freq: Once | INTRAVENOUS | Status: AC
  Administered 2011-08-06: 1020 mg via INTRAVENOUS
  Filled 2011-08-06: qty 34

## 2011-08-06 MED ORDER — SODIUM CHLORIDE 0.9 % IV SOLN
Freq: Once | INTRAVENOUS | Status: AC
  Administered 2011-08-06: 11:00:00 via INTRAVENOUS

## 2011-08-06 MED ORDER — HEPARIN SOD (PORK) LOCK FLUSH 100 UNIT/ML IV SOLN
500.0000 [IU] | Freq: Once | INTRAVENOUS | Status: AC | PRN
  Administered 2011-08-06: 500 [IU]
  Filled 2011-08-06: qty 5

## 2011-08-06 NOTE — Telephone Encounter (Signed)
gve the pt her ct scan instructions along with the contrast

## 2011-08-06 NOTE — Patient Instructions (Addendum)
[facility] Discharge Instructions for Patients Receiving Chemotherapy  Today you received the following chemotherapy agents; Gemzar  To help prevent nausea and vomiting after your treatment, we encourage you to take your nausea medication as prescribed by your physician. Begin taking it as often as prescribed.   If you develop nausea and vomiting that is not controlled by your nausea medication, call the clinic. If it is after clinic hours your family physician or the after hours number for the clinic or go to the Emergency Department.   BELOW ARE SYMPTOMS THAT SHOULD BE REPORTED IMMEDIATELY:  *FEVER GREATER THAN 100.5 F  *CHILLS WITH OR WITHOUT FEVER  NAUSEA AND VOMITING THAT IS NOT CONTROLLED WITH YOUR NAUSEA MEDICATION  *UNUSUAL SHORTNESS OF BREATH  *UNUSUAL BRUISING OR BLEEDING  TENDERNESS IN MOUTH AND THROAT WITH OR WITHOUT PRESENCE OF ULCERS  *URINARY PROBLEMS  *BOWEL PROBLEMS  UNUSUAL RASH Items with * indicate a potential emergency and should be followed up as soon as possible.  One of the nurses will contact you 24 hours after your treatment. Please let the nurse know about any problems that you may have experienced. Feel free to call the clinic you have any questions or concerns. The clinic phone number is [phone].   I have been informed and understand all the instructions given to me. I know to contact the clinic, my physician, or go to the Emergency Department if any problems should occur. I do not have any questions at this time, but understand that I may call the clinic during office hours   should I have any questions or need assistance in obtaining follow up care.    __________________________________________  _____________  __________ Signature of Patient or Authorized Representative            Date                   Time    __________________________________________ Nurse's Signature   Ferumoxytol injection What is this  medicine? FERUMOXYTOL is an iron complex. Iron is used to make healthy red blood cells, which carry oxygen and nutrients throughout the body. This medicine is used to treat iron deficiency anemia in people with chronic kidney disease. This medicine may be used for other purposes; ask your health care provider or pharmacist if you have questions. What should I tell my health care provider before I take this medicine? They need to know if you have any of these conditions: -anemia not caused by low iron levels -high levels of iron in the blood -magnetic resonance imaging (MRI) test scheduled -an unusual or allergic reaction to iron, other medicines, foods, dyes, or preservatives -pregnant or trying to get pregnant -breast-feeding How should I use this medicine? This medicine is for infusion into a vein. It is given by a health care professional in a hospital or clinic setting. Talk to your pediatrician regarding the use of this medicine in children. Special care may be needed. Overdosage: If you think you've taken too much of this medicine contact a poison control center or emergency room at once. Overdosage: If you think you have taken too much of this medicine contact a poison control center or emergency room at once. NOTE: This medicine is only for you. Do not share this medicine with others. What if I miss a dose? It is important not to miss your dose. Call your doctor or health care professional if you are unable to keep an appointment. What may interact with  this medicine? This medicine may interact with the following medications: -other iron products This list may not describe all possible interactions. Give your health care provider a list of all the medicines, herbs, non-prescription drugs, or dietary supplements you use. Also tell them if you smoke, drink alcohol, or use illegal drugs. Some items may interact with your medicine. What should I watch for while using this medicine? Visit  your doctor or healthcare professional regularly. Tell your doctor or healthcare professional if your symptoms do not start to get better or if they get worse. You may need blood work done while you are taking this medicine. You may need to follow a special diet. Talk to your doctor. Foods that contain iron include: whole grains/cereals, dried fruits, beans, or peas, leafy green vegetables, and organ meats (liver, kidney). What side effects may I notice from receiving this medicine? Side effects that you should report to your doctor or health care professional as soon as possible: -allergic reactions like skin rash, itching or hives, swelling of the face, lips, or tongue -breathing problems -changes in blood pressure -feeling faint or lightheaded, falls -fever or chills -flushing, sweating, or hot feelings -swelling of the ankles or feet Side effects that usually do not require medical attention (Report these to your doctor or health care professional if they continue or are bothersome.): -diarrhea -headache -nausea, vomiting -stomach pain This list may not describe all possible side effects. Call your doctor for medical advice about side effects. You may report side effects to FDA at 1-800-FDA-1088. Where should I keep my medicine? This drug is given in a hospital or clinic and will not be stored at home. NOTE: This sheet is a summary. It may not cover all possible information. If you have questions about this medicine, talk to your doctor, pharmacist, or health care provider.  2012, Elsevier/Gold Standard. (12/24/2007 9:48:25 PM)

## 2011-08-06 NOTE — Progress Notes (Signed)
Fax information to Gwenith Daily at [employer] [phone].

## 2011-08-07 ENCOUNTER — Encounter: Payer: Self-pay | Admitting: *Deleted

## 2011-08-13 ENCOUNTER — Ambulatory Visit: Payer: Self-pay | Admitting: Lab

## 2011-08-13 ENCOUNTER — Ambulatory Visit: Payer: Self-pay | Admitting: Nutrition

## 2011-08-13 ENCOUNTER — Encounter (HOSPITAL_COMMUNITY)
Admission: RE | Admit: 2011-08-13 | Discharge: 2011-08-13 | Disposition: A | Discharge status: Home / Self Care | Payer: Self-pay | Source: Ambulatory Visit | Attending: Oncology | Admitting: Oncology

## 2011-08-13 ENCOUNTER — Ambulatory Visit (HOSPITAL_BASED_OUTPATIENT_CLINIC_OR_DEPARTMENT_OTHER): Payer: Self-pay

## 2011-08-13 VITALS — BP 118/72 | HR 83 | Temp 98.6°F

## 2011-08-13 DIAGNOSIS — C55 Malignant neoplasm of uterus, part unspecified: Secondary | ICD-10-CM

## 2011-08-13 DIAGNOSIS — Z5111 Encounter for antineoplastic chemotherapy: Secondary | ICD-10-CM

## 2011-08-13 LAB — PREPARE RBC (CROSSMATCH)

## 2011-08-13 LAB — CBC WITH DIFFERENTIAL/PLATELET
BASO%: 0.9 % (ref 0.0–2.0)
Eosinophils Absolute: 0 10*3/uL (ref 0.0–0.5)
HCT: 22.7 % — ABNORMAL LOW (ref 34.8–46.6)
HGB: 7.4 g/dL — ABNORMAL LOW (ref 11.6–15.9)
MCHC: 32.6 g/dL (ref 31.5–36.0)
MONO#: 0.8 10*3/uL (ref 0.1–0.9)
NEUT#: 3.8 10*3/uL (ref 1.5–6.5)
NEUT%: 58.6 % (ref 38.4–76.8)
Platelets: 341 10*3/uL (ref 145–400)
WBC: 6.5 10*3/uL (ref 3.9–10.3)
lymph#: 1.8 10*3/uL (ref 0.9–3.3)

## 2011-08-13 MED ORDER — HEPARIN SOD (PORK) LOCK FLUSH 100 UNIT/ML IV SOLN
500.0000 [IU] | Freq: Once | INTRAVENOUS | Status: AC | PRN
  Administered 2011-08-13: 500 [IU]
  Filled 2011-08-13: qty 5

## 2011-08-13 MED ORDER — DEXAMETHASONE SODIUM PHOSPHATE 4 MG/ML IJ SOLN
20.0000 mg | Freq: Once | INTRAMUSCULAR | Status: AC
  Administered 2011-08-13: 20 mg via INTRAVENOUS

## 2011-08-13 MED ORDER — SODIUM CHLORIDE 0.9 % IV SOLN
Freq: Once | INTRAVENOUS | Status: AC
  Administered 2011-08-13: 10:00:00 via INTRAVENOUS

## 2011-08-13 MED ORDER — ONDANSETRON 16 MG/50ML IVPB (CHCC)
16.0000 mg | Freq: Once | INTRAVENOUS | Status: AC
  Administered 2011-08-13: 16 mg via INTRAVENOUS

## 2011-08-13 MED ORDER — SODIUM CHLORIDE 0.9 % IV SOLN
900.0000 mg/m2 | Freq: Once | INTRAVENOUS | Status: AC
  Administered 2011-08-13: 2052 mg via INTRAVENOUS
  Filled 2011-08-13: qty 54

## 2011-08-13 MED ORDER — DOCETAXEL CHEMO INJECTION 160 MG/16ML
100.0000 mg/m2 | Freq: Once | INTRAVENOUS | Status: AC
  Administered 2011-08-13: 230 mg via INTRAVENOUS
  Filled 2011-08-13: qty 23

## 2011-08-13 MED ORDER — SODIUM CHLORIDE 0.9 % IJ SOLN
10.0000 mL | INTRAMUSCULAR | Status: DC | PRN
  Administered 2011-08-13: 10 mL
  Filled 2011-08-13: qty 10

## 2011-08-13 NOTE — Patient Instructions (Signed)
Pt knows to return 4/30 at 3pm for blood transfusion and neulasta injection.

## 2011-08-13 NOTE — Assessment & Plan Note (Signed)
Jackie Cook is a 51 year old female patient of Dr. Precious Reel diagnosed with metastatic uterine cancer, status post surgery, receiving chemotherapy.  MEDICAL HISTORY INCLUDES:  Acute renal failure, anemia and status post bilateral nephrostomy tubes.  MEDICATIONS INCLUDE:  Decadron, Colace, Ativan, milk of magnesia, Protonix, MiraLAX, and Phenergan.  LABS:  A glucose of 104 and an albumin of 3.1 on April 22nd.  HEIGHT:  66 inches. Weight 231.8 pounds. USUAL BODY WEIGHT:  235-240 pounds. BMI:  37.43. (obese)  The patient was identified at risk from the nutrition risk screening.  I spoke with her in chemotherapy today, and she reports that she did have a bad appetite with occasional nausea.  However, her appetite is now improved.  The medications she takes for nausea are effective.  Her main complaint is taste alteration.  She reports no desire to eat very much. She does have occasional constipation, which is relieved with medication.  NUTRITION DIAGNOSIS:  Food and nutrition-related knowledge deficit related to diagnosis of metastatic cancer and associated treatments as evidenced by no prior need for nutrition-related information.   INTERVENTION:  I have educated the patient on the importance of small, frequent meals with a protein source every time she eats.  We have discussed the importance of bland foods for dealing with nausea.  I have educated her on strategies for dealing with taste alteration and  encouraged her to strive to maintain her weight throughout treatment.  I have also given her a handout on foods that contain a lot of iron as she tends to be anemic.  MONITORING/EVALUATION (GOALS):  The patient will tolerate increased oral intake to promote weight maintenance throughout treatment.   NEXT VISIT:  Patient will call with questions or concerns.  There is no followup scheduled.    ______________________________ Zenovia Jarred, RD, LDN Clinical Nutrition Specialist BN/MEDQ  D:   08/13/2011  T:  08/13/2011  Job:  [id number]

## 2011-08-14 ENCOUNTER — Other Ambulatory Visit: Payer: Self-pay

## 2011-08-14 ENCOUNTER — Ambulatory Visit: Payer: Self-pay

## 2011-08-14 ENCOUNTER — Ambulatory Visit (HOSPITAL_BASED_OUTPATIENT_CLINIC_OR_DEPARTMENT_OTHER): Payer: Medicaid Other

## 2011-08-14 ENCOUNTER — Other Ambulatory Visit: Payer: Self-pay | Admitting: Certified Registered Nurse Anesthetist

## 2011-08-14 VITALS — BP 106/68 | HR 82 | Temp 98.8°F

## 2011-08-14 DIAGNOSIS — C55 Malignant neoplasm of uterus, part unspecified: Secondary | ICD-10-CM

## 2011-08-14 DIAGNOSIS — D509 Iron deficiency anemia, unspecified: Secondary | ICD-10-CM

## 2011-08-14 MED ORDER — PEGFILGRASTIM INJECTION 6 MG/0.6ML
6.0000 mg | Freq: Once | SUBCUTANEOUS | Status: AC
  Administered 2011-08-14: 6 mg via SUBCUTANEOUS
  Filled 2011-08-14: qty 0.6

## 2011-08-14 MED ORDER — SODIUM CHLORIDE 0.9 % IV SOLN
250.0000 mL | Freq: Once | INTRAVENOUS | Status: AC
  Administered 2011-08-14: 250 mL via INTRAVENOUS

## 2011-08-14 MED ORDER — HEPARIN SOD (PORK) LOCK FLUSH 100 UNIT/ML IV SOLN
500.0000 [IU] | Freq: Every day | INTRAVENOUS | Status: AC | PRN
  Administered 2011-08-14: 500 [IU]
  Filled 2011-08-14: qty 5

## 2011-08-14 MED ORDER — ACETAMINOPHEN 325 MG PO TABS
650.0000 mg | ORAL_TABLET | Freq: Once | ORAL | Status: AC
  Administered 2011-08-14: 650 mg via ORAL

## 2011-08-14 MED ORDER — SODIUM CHLORIDE 0.9 % IJ SOLN
10.0000 mL | INTRAMUSCULAR | Status: AC | PRN
  Administered 2011-08-14: 10 mL
  Filled 2011-08-14: qty 10

## 2011-08-14 NOTE — Progress Notes (Signed)
Unit of blood started at 1547

## 2011-08-14 NOTE — Progress Notes (Signed)
MS. Mayford Knife NOTIFIED OF APPT. ON 08-17-11 AT 0815 FOR DOPPLER STUDY AT [facility].

## 2011-08-14 NOTE — Progress Notes (Signed)
Discharged at 1800 by her self to home.  Lives alone but says she will be calling a friend to stay tonight with her.

## 2011-08-14 NOTE — Patient Instructions (Addendum)
Blood Transfusion Information  WHAT IS A BLOOD TRANSFUSION?  A transfusion is the replacement of blood or some of its parts. Blood is made up of multiple cells which provide different functions.   Red blood cells carry oxygen and are used for blood loss replacement.   White blood cells fight against infection.   Platelets control bleeding.   Plasma helps clot blood.   Other blood products are available for specialized needs, such as hemophilia or other clotting disorders.  BEFORE THE TRANSFUSION   Who gives blood for transfusions?    You may be able to donate blood to be used at a later date on yourself (autologous donation).   Relatives can be asked to donate blood. This is generally not any safer than if you have received blood from a stranger. The same precautions are taken to ensure safety when a relative's blood is donated.   Healthy volunteers who are fully evaluated to make sure their blood is safe. This is blood bank blood.  Transfusion therapy is the safest it has ever been in the practice of medicine. Before blood is taken from a donor, a complete history is taken to make sure that person has no history of diseases nor engages in risky social behavior (examples are intravenous drug use or sexual activity with multiple partners). The donor's travel history is screened to minimize risk of transmitting infections, such as malaria. The donated blood is tested for signs of infectious diseases, such as HIV and hepatitis. The blood is then tested to be sure it is compatible with you in order to minimize the chance of a transfusion reaction. If you or a relative donates blood, this is often done in anticipation of surgery and is not appropriate for emergency situations. It takes many days to process the donated blood.  RISKS AND COMPLICATIONS  Although transfusion therapy is very safe and saves many lives, the main dangers of transfusion include:    Getting an infectious disease.   Developing a  transfusion reaction. This is an allergic reaction to something in the blood you were given. Every precaution is taken to prevent this.  The decision to have a blood transfusion has been considered carefully by your caregiver before blood is given. Blood is not given unless the benefits outweigh the risks.  AFTER THE TRANSFUSION   Right after receiving a blood transfusion, you will usually feel much better and more energetic. This is especially true if your red blood cells have gotten low (anemic). The transfusion raises the level of the red blood cells which carry oxygen, and this usually causes an energy increase.   The nurse administering the transfusion will monitor you carefully for complications.  HOME CARE INSTRUCTIONS   No special instructions are needed after a transfusion. You may find your energy is better. Speak with your caregiver about any limitations on activity for underlying diseases you may have.  SEEK MEDICAL CARE IF:    Your condition is not improving after your transfusion.   You develop redness or irritation at the intravenous (IV) site.  SEEK IMMEDIATE MEDICAL CARE IF:   Any of the following symptoms occur over the next 12 hours:   Shaking chills.   You have a temperature by mouth above 102 F (38.9 C), not controlled by medicine.   Chest, back, or muscle pain.   People around you feel you are not acting correctly or are confused.   Shortness of breath or difficulty breathing.   Dizziness and fainting.     in urine output.   Your urine turns a dark color or changes to pink, red, or brown.  Any of the following symptoms occur over the next 10 days:  You have a temperature by mouth above 102 F (38.9 C), not controlled by medicine.   Shortness of breath.   Weakness after normal activity.   The white part of the eye turns yellow (jaundice).   You have a decrease in the amount of urine or are urinating less  often.   Your urine turns a dark color or changes to pink, red, or brown.   You also received neulasta injection today.  Claritin, tylenol or ibuprofen may help relieve any bone pain associated with this injection.  Please call if needed. Document Released: 03/30/2000 Document Revised: 03/22/2011 Document Reviewed: 11/17/2007 [facility] Patient Information 2012 [place], [place].Blood Transfusion Information WHAT IS A BLOOD TRANSFUSION? A transfusion is the replacement of blood or some of its parts. Blood is made up of multiple cells which provide different functions.  Red blood cells carry oxygen and are used for blood loss replacement.   White blood cells fight against infection.   Platelets control bleeding.   Plasma helps clot blood.   Other blood products are available for specialized needs, such as hemophilia or other clotting disorders.  BEFORE THE TRANSFUSION  Who gives blood for transfusions?   You may be able to donate blood to be used at a later date on yourself (autologous donation).   Relatives can be asked to donate blood. This is generally not any safer than if you have received blood from a stranger. The same precautions are taken to ensure safety when a relative's blood is donated.   Healthy volunteers who are fully evaluated to make sure their blood is safe. This is blood bank blood.  Transfusion therapy is the safest it has ever been in the practice of medicine. Before blood is taken from a donor, a complete history is taken to make sure that person has no history of diseases nor engages in risky social behavior (examples are intravenous drug use or sexual activity with multiple partners). The donor's travel history is screened to minimize risk of transmitting infections, such as malaria. The donated blood is tested for signs of infectious diseases, such as HIV and hepatitis. The blood is then tested to be sure it is compatible with you in order to minimize the chance of a  transfusion reaction. If you or a relative donates blood, this is often done in anticipation of surgery and is not appropriate for emergency situations. It takes many days to process the donated blood. RISKS AND COMPLICATIONS Although transfusion therapy is very safe and saves many lives, the main dangers of transfusion include:   Getting an infectious disease.   Developing a transfusion reaction. This is an allergic reaction to something in the blood you were given. Every precaution is taken to prevent this.  The decision to have a blood transfusion has been considered carefully by your caregiver before blood is given. Blood is not given unless the benefits outweigh the risks. AFTER THE TRANSFUSION  Right after receiving a blood transfusion, you will usually feel much better and more energetic. This is especially true if your red blood cells have gotten low (anemic). The transfusion raises the level of the red blood cells which carry oxygen, and this usually causes an energy increase.   The nurse administering the transfusion will monitor you carefully for complications.  HOME CARE INSTRUCTIONS  No  special instructions are needed after a transfusion. You may find your energy is better. Speak with your caregiver about any limitations on activity for underlying diseases you may have. SEEK MEDICAL CARE IF:   Your condition is not improving after your transfusion.   You develop redness or irritation at the intravenous (IV) site.  SEEK IMMEDIATE MEDICAL CARE IF:  Any of the following symptoms occur over the next 12 hours:  Shaking chills.   You have a temperature by mouth above 102 F (38.9 C), not controlled by medicine.   Chest, back, or muscle pain.   People around you feel you are not acting correctly or are confused.   Shortness of breath or difficulty breathing.   Dizziness and fainting.   You get a rash or develop hives.   You have a decrease in urine output.   Your urine  turns a dark color or changes to pink, red, or brown.  Any of the following symptoms occur over the next 10 days:  You have a temperature by mouth above 102 F (38.9 C), not controlled by medicine.   Shortness of breath.   Weakness after normal activity.   The white part of the eye turns yellow (jaundice).   You have a decrease in the amount of urine or are urinating less often.   Your urine turns a dark color or changes to pink, red, or brown.  Document Released: 03/30/2000 Document Revised: 03/22/2011 Document Reviewed: 11/17/2007 [facility] Patient Information 2012 [place], [place].

## 2011-08-14 NOTE — Progress Notes (Signed)
MS. Mayford Knife HERE FOR BLOOD TRANSFUSION.  SHE CC OF RLE SWELLING WITH INTERMITTENT PAIN IN CALF,  FOOT, AND BURNING IN RIGHT GROIN.  RIGHT LEG SIZE  > THAN LEFT. ONSET 1 WEEK.  PT. TO GET A BILATERAL LE DOPPLER ON 08-17-11 AT 0830  AT [facility]  PRIOR TO VISIT WITH ADRENA  JOHNSON PA-C.

## 2011-08-14 NOTE — Progress Notes (Signed)
Unit of blood ended.  Vitals wnl.  Flushing with saline at 185cc/hr.  Blood was started at 1547 at 50cc/hr.  No signs of reaction.  VSS.  Rate increased to 185 cc/hr at 1602.

## 2011-08-15 LAB — TYPE AND SCREEN
ABO/RH(D): O POS
Antibody Screen: NEGATIVE

## 2011-08-17 ENCOUNTER — Other Ambulatory Visit (HOSPITAL_BASED_OUTPATIENT_CLINIC_OR_DEPARTMENT_OTHER): Payer: Medicaid Other | Admitting: Lab

## 2011-08-17 ENCOUNTER — Encounter: Payer: Self-pay | Admitting: Physician Assistant

## 2011-08-17 ENCOUNTER — Ambulatory Visit (HOSPITAL_BASED_OUTPATIENT_CLINIC_OR_DEPARTMENT_OTHER): Payer: Medicaid Other | Admitting: Physician Assistant

## 2011-08-17 ENCOUNTER — Telehealth: Payer: Self-pay | Admitting: Oncology

## 2011-08-17 ENCOUNTER — Ambulatory Visit (HOSPITAL_COMMUNITY)
Admission: RE | Admit: 2011-08-17 | Discharge: 2011-08-17 | Disposition: A | Discharge status: Home / Self Care | Payer: Medicaid Other | Source: Ambulatory Visit | Attending: Oncology | Admitting: Oncology

## 2011-08-17 VITALS — BP 125/64 | HR 111 | Temp 98.1°F | Ht 66.0 in | Wt 245.3 lb

## 2011-08-17 DIAGNOSIS — M79609 Pain in unspecified limb: Secondary | ICD-10-CM | POA: Insufficient documentation

## 2011-08-17 DIAGNOSIS — I82409 Acute embolism and thrombosis of unspecified deep veins of unspecified lower extremity: Secondary | ICD-10-CM

## 2011-08-17 DIAGNOSIS — R600 Localized edema: Secondary | ICD-10-CM

## 2011-08-17 DIAGNOSIS — I824Y9 Acute embolism and thrombosis of unspecified deep veins of unspecified proximal lower extremity: Secondary | ICD-10-CM | POA: Insufficient documentation

## 2011-08-17 DIAGNOSIS — M7989 Other specified soft tissue disorders: Secondary | ICD-10-CM | POA: Insufficient documentation

## 2011-08-17 DIAGNOSIS — D638 Anemia in other chronic diseases classified elsewhere: Secondary | ICD-10-CM

## 2011-08-17 DIAGNOSIS — C55 Malignant neoplasm of uterus, part unspecified: Secondary | ICD-10-CM

## 2011-08-17 DIAGNOSIS — C549 Malignant neoplasm of corpus uteri, unspecified: Secondary | ICD-10-CM

## 2011-08-17 LAB — CBC WITH DIFFERENTIAL/PLATELET
BASO%: 0.5 % (ref 0.0–2.0)
EOS%: 0.9 % (ref 0.0–7.0)
HCT: 25 % — ABNORMAL LOW (ref 34.8–46.6)
HGB: 8.1 g/dL — ABNORMAL LOW (ref 11.6–15.9)
MCHC: 32.4 g/dL (ref 31.5–36.0)
MONO#: 0.2 10*3/uL (ref 0.1–0.9)
NEUT%: 92.6 % — ABNORMAL HIGH (ref 38.4–76.8)
RDW: 23.5 % — ABNORMAL HIGH (ref 11.2–14.5)
WBC: 28.1 10*3/uL — ABNORMAL HIGH (ref 3.9–10.3)
lymph#: 1.5 10*3/uL (ref 0.9–3.3)

## 2011-08-17 LAB — BASIC METABOLIC PANEL
BUN: 9 mg/dL (ref 6–23)
Chloride: 106 mEq/L (ref 96–112)
Potassium: 3.9 mEq/L (ref 3.5–5.3)
Sodium: 140 mEq/L (ref 135–145)

## 2011-08-17 MED ORDER — FONDAPARINUX SODIUM 10 MG/0.8ML ~~LOC~~ SOLN: 10.0000 mg | SUBCUTANEOUS | Status: DC

## 2011-08-17 MED ORDER — WARFARIN SODIUM 5 MG PO TABS: ORAL_TABLET | ORAL | Status: DC

## 2011-08-17 NOTE — Progress Notes (Signed)
OFFICE PROGRESS NOTE Date of Visit 07-30-2011 Physicians: D.ClarkePearson/ W.Brewster, S.Dahlstedt. Interventional Radiology  INTERVAL HISTORY:  Patient is seen, alone for visit, in continuing attention to her extensive uterine leiomyosarcoma, for which she had surgery in early Feb 2013.  Due for her fourth cycle of gemzar/taxotere 08/27/2011  History is of heavy menstrual bleeding x years, then 4 months of continuous light vaginal bleeding fall 2012, and more recent constipation. She had also lost 50 lbs in 8 months, which was reportedly intentional. She presented to ED with pain, with CT AP 05-07-2011 in [facility] with 20 x 16.7 x 16 cm pelvic mass thought to be uterus, with second 10.3x10.7x8.8 cm mass adjacent, an 11 x 7.5 cm nodal mass encasing right iliac vessels and severe right hydronephrosis. She was referred to gyn oncology, seen by [facility] 05-11-11, then went to exploratory laparotomy with supracervical hysterectomy at [facility] by Dr.Wendy Nelly Rout on 05-22-11. At surgery she had a 24 cm uterus extending to bilateral pelvic sidewalls with an ~ 15 cm retroperitoneal mass. Pathology [phone]) showed multifocal leiomyosarcoma spanning 17 cm, involving uterus and right adnexa, + LVSI. Postoperatively she was tachycardic which improved with 2 units PRBCs; she also had CT angio chest done during that hospitalization 05-24-11 which was negative for PE but did show multiple bilateral pulmonary nodules radiographically consistent with metastases. She was seen in follow up by Dr.Brewster at clinic on 05-31-2011, with increased swelling of right thigh and right back pain; venous doppler was negative for clot RLE. She was seen in new patient consultation by Dr Darrold Span on 06-12-2011, with creatinine up to 3.76, admitted, seen in hospital by urology and bilateral PCNs placed by IR which likely will be needed long term and which will be managed by IR. She had PAC placed by IR.  Patient to have nephrostomy  tubes changed 08/22/2011.   She presents for evaluation of complaints of right lower extremity pain and swelling. She's had some swelling in the right lower extremity off and on for "a while" however she noticed significantly increased swelling on Monday with a burning-type pain starting on Tuesday. She had bilateral lower extremity Dopplers earlier this morning with the study being positive for a right common femoral and greater saphenous junction DVT. She does note some shortness of breath with exertion. She's also experiencing some mild nausea this morning although she also said she's not had anything as yet.   Otherwise she denies significant cough, no bladder discomfort, no bleeding. She had no chest pain or severe aches otherwise after the most recent neulasta injection. She has had no fever or clear symptoms of infection. Remainder of 10 point Review of Systems negative/ unchanged.  Objective:  Vital signs in last 24 hours:  BP 125/64  Pulse 111  Temp(Src) 98.1 F (36.7 C) (Oral)  Ht 5\' 6"  (1.676 m)  Wt 245 lb 4.8 oz (111.267 kg)  BMI 39.59 kg/m2  LMP 04/09/2011 Weight is up 8 lbs from 07-11-11. Alert, looks moderately uncomfortable which seems primarily from right nephrostomy, ambulatory without assistance.  HEENT:mucous membranes moist, pharynx normal without lesions. Extremely poor dentition.  PERRL, not icteric.  LymphaticsCervical, supraclavicular, and axillary nodes normal. Resp: clear to auscultation bilaterally and normal percussion bilaterally Cardio: regular rate and rhythm, clear heart sounds GI: bowel sounds diminished but otherwise normal. Not distended. Soft. Not tender in epigastrium.,and no localized area of tenderness otherwise, tho she indicates discomfort across all of mid abdomen. Surgical incision healed. No inguinal adenopathy appreciated. Nephrostomy sites dressed.  Dressings dry Extremities: left lower extremity normal, atraumatic, no cyanosis or edema, right  lower extremity with 1+ - 2+ edema from upper thigh to calf Neuro:decreased senstation right anterior thigh as she has had since surgery.  Portacath without erythema, tenderness or warmth  Lab Results:   Basename 08/17/11 0925  WBC 28.1*  HGB 8.1*  HCT 25.0*  PLT 191   ANC 26.0 (WBC/ANC up post neulasta 4-10) HGB is up from 7.4 on 08/13/2011. She is on oral iron, did have low iron studies in Feb with hx heavy gyn bleeding.  BMET  Basename 08/17/11 0925  NA 140  K 3.9  CL 106  CO2 23  GLUCOSE 91  BUN 9  CREATININE 0.88  CALCIUM 8.8    Studies/Results:  No results found.  Medications: I have reviewed the patient's current medications. She'll be started on Arixtra 10 mg subcutaneously daily for the next 7 days and Coumadin 5 mg by mouth daily between 4 and 6 PM beginning 08/18/2011.  Assessment/Plan: 1. 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 She will see PA on 5-3 to check labs, then have CT chest~ May 6 and see me at least May 10. 2.Bilateral ureteral obstruction with bilateral percutaneous nephrostomy tubes-due to have tubes changed 08/22/2011 by IR 3. PAC in 4. Anemia : may need PRBCs if < 8 or symptoms. This is multifactorial including iron deficiency from gyn bleeding, chemo, chronic disease. IV iron with day 1 chemo 08-06-11. 5. Extremely poor dentition 6.tobacco abuse recently DC'd 7. right lower extremity DVT affecting the junction of the common femoral and greater saphenous veins. This was discussed with Dr. Darrold Span. The patient will be started on Arixtra 10 mg subcutaneously daily for the next 7 days she is familiar with giving her self injections and instructions were reviewed with the pain by our desk nurse. She'll begin Coumadin therapy on 08/18/2011 initially at 5 mg by mouth daily. She'll return for a  CBC differential and PT/INR on 08/20/2011. Patient is due for a CT scan on 08/20/2011 and will come in early to have the labs drawn. Further PT/INR labs will be determined based on her results on 08/20/2011. She'll followup as previously scheduled with Dr. Darrold Span on 08/24/2011.   Jackie Loft E, PA-C   08/17/2011, 3:25 PM

## 2011-08-17 NOTE — Progress Notes (Signed)
I  observed pt self administer Arixtra  successfully .

## 2011-08-17 NOTE — Progress Notes (Signed)
*  PRELIMINARY RESULTS* Vascular Ultrasound Lower Extremity Venous Duplex has been completed.  Preliminary findings: Right= Evidence of partial DVT involving the common femoral vein and SVT involving the greater saphenous vein at the saphenofemoral junction.  Farrel Demark, RDMS 08/17/2011, 9:02 AM

## 2011-08-17 NOTE — Telephone Encounter (Signed)
Pt aware to come in at 10:00 prior to her ct appt,req that I not print it for her    aom

## 2011-08-20 ENCOUNTER — Other Ambulatory Visit: Payer: Self-pay | Admitting: Oncology

## 2011-08-20 ENCOUNTER — Other Ambulatory Visit (HOSPITAL_BASED_OUTPATIENT_CLINIC_OR_DEPARTMENT_OTHER): Payer: Medicaid Other | Admitting: Lab

## 2011-08-20 ENCOUNTER — Ambulatory Visit (HOSPITAL_COMMUNITY)
Admission: RE | Admit: 2011-08-20 | Discharge: 2011-08-20 | Disposition: A | Discharge status: Home / Self Care | Payer: Medicaid Other | Source: Ambulatory Visit | Attending: Oncology | Admitting: Oncology

## 2011-08-20 ENCOUNTER — Other Ambulatory Visit: Payer: Self-pay

## 2011-08-20 ENCOUNTER — Encounter (HOSPITAL_COMMUNITY): Payer: Self-pay

## 2011-08-20 DIAGNOSIS — N281 Cyst of kidney, acquired: Secondary | ICD-10-CM | POA: Insufficient documentation

## 2011-08-20 DIAGNOSIS — C55 Malignant neoplasm of uterus, part unspecified: Secondary | ICD-10-CM

## 2011-08-20 DIAGNOSIS — I82409 Acute embolism and thrombosis of unspecified deep veins of unspecified lower extremity: Secondary | ICD-10-CM

## 2011-08-20 DIAGNOSIS — I2510 Atherosclerotic heart disease of native coronary artery without angina pectoris: Secondary | ICD-10-CM | POA: Insufficient documentation

## 2011-08-20 DIAGNOSIS — Z79899 Other long term (current) drug therapy: Secondary | ICD-10-CM | POA: Insufficient documentation

## 2011-08-20 DIAGNOSIS — Z936 Other artificial openings of urinary tract status: Secondary | ICD-10-CM | POA: Insufficient documentation

## 2011-08-20 DIAGNOSIS — Z90710 Acquired absence of both cervix and uterus: Secondary | ICD-10-CM | POA: Insufficient documentation

## 2011-08-20 DIAGNOSIS — C7800 Secondary malignant neoplasm of unspecified lung: Secondary | ICD-10-CM | POA: Insufficient documentation

## 2011-08-20 DIAGNOSIS — Z7901 Long term (current) use of anticoagulants: Secondary | ICD-10-CM

## 2011-08-20 DIAGNOSIS — R1909 Other intra-abdominal and pelvic swelling, mass and lump: Secondary | ICD-10-CM | POA: Insufficient documentation

## 2011-08-20 DIAGNOSIS — Z9079 Acquired absence of other genital organ(s): Secondary | ICD-10-CM | POA: Insufficient documentation

## 2011-08-20 DIAGNOSIS — R609 Edema, unspecified: Secondary | ICD-10-CM | POA: Insufficient documentation

## 2011-08-20 DIAGNOSIS — Z5181 Encounter for therapeutic drug level monitoring: Secondary | ICD-10-CM

## 2011-08-20 LAB — CBC WITH DIFFERENTIAL/PLATELET
BASO%: 0.4 % (ref 0.0–2.0)
Basophils Absolute: 0.2 10*3/uL — ABNORMAL HIGH (ref 0.0–0.1)
EOS%: 0.2 % (ref 0.0–7.0)
HGB: 7.5 g/dL — ABNORMAL LOW (ref 11.6–15.9)
MCH: 28.6 pg (ref 25.1–34.0)
MONO%: 0.6 % (ref 0.0–14.0)
RBC: 2.62 10*6/uL — ABNORMAL LOW (ref 3.70–5.45)
RDW: 24.3 % — ABNORMAL HIGH (ref 11.2–14.5)
lymph#: 3 10*3/uL (ref 0.9–3.3)
nRBC: 0 % (ref 0–0)

## 2011-08-20 LAB — PROTIME-INR
INR: 1.3 — ABNORMAL LOW (ref 2.00–3.50)
Protime: 15.6 Seconds — ABNORMAL HIGH (ref 10.6–13.4)

## 2011-08-20 MED ORDER — IOHEXOL 300 MG/ML  SOLN
125.0000 mL | Freq: Once | INTRAMUSCULAR | Status: AC | PRN
  Administered 2011-08-20: 125 mL via INTRAVENOUS

## 2011-08-20 NOTE — Progress Notes (Signed)
SP[facility] MS. [facility] TOLD HER THAT SHE IS TO CONTINUE LOVENOX AND INCREASE COUMADIN TO 7.5 MG TONIGHT AND TOMORROW.  REPEAT PT/INR 5-13 AT 1015.  PT. VERBALIZED UNDERSTANDING.

## 2011-08-21 ENCOUNTER — Encounter: Payer: Self-pay | Admitting: Oncology

## 2011-08-21 ENCOUNTER — Telehealth (HOSPITAL_COMMUNITY): Payer: Self-pay | Admitting: Urology

## 2011-08-21 NOTE — Progress Notes (Signed)
Patient received two prescriptions from [facility] on 08/17/11 $139.71,her remaninig balance [facility] $182.47.

## 2011-08-22 ENCOUNTER — Other Ambulatory Visit (HOSPITAL_BASED_OUTPATIENT_CLINIC_OR_DEPARTMENT_OTHER): Payer: Medicaid Other | Admitting: Lab

## 2011-08-22 ENCOUNTER — Other Ambulatory Visit: Payer: Self-pay | Admitting: Urology

## 2011-08-22 ENCOUNTER — Ambulatory Visit: Payer: Self-pay

## 2011-08-22 ENCOUNTER — Other Ambulatory Visit: Payer: Self-pay | Admitting: Oncology

## 2011-08-22 ENCOUNTER — Ambulatory Visit (HOSPITAL_COMMUNITY)
Admission: RE | Admit: 2011-08-22 | Discharge: 2011-08-22 | Disposition: A | Discharge status: Home / Self Care | Payer: Medicaid Other | Source: Ambulatory Visit | Attending: Urology | Admitting: Urology

## 2011-08-22 DIAGNOSIS — Z436 Encounter for attention to other artificial openings of urinary tract: Secondary | ICD-10-CM | POA: Insufficient documentation

## 2011-08-22 DIAGNOSIS — N135 Crossing vessel and stricture of ureter without hydronephrosis: Secondary | ICD-10-CM

## 2011-08-22 DIAGNOSIS — I82409 Acute embolism and thrombosis of unspecified deep veins of unspecified lower extremity: Secondary | ICD-10-CM

## 2011-08-22 DIAGNOSIS — R1909 Other intra-abdominal and pelvic swelling, mass and lump: Secondary | ICD-10-CM | POA: Insufficient documentation

## 2011-08-22 LAB — PROTIME-INR
INR: 1.7 — ABNORMAL LOW (ref 2.00–3.50)
Protime: 20.4 Seconds — ABNORMAL HIGH (ref 10.6–13.4)

## 2011-08-22 MED ORDER — LIDOCAINE HCL 1 % IJ SOLN
INTRAMUSCULAR | Status: AC
  Filled 2011-08-22: qty 20

## 2011-08-22 NOTE — Patient Instructions (Addendum)
Continue with  arixtra 10 mg injections.  Take 10 mg of coumadin 08-22-11; 08-23-11 between 4-6 pm

## 2011-08-24 ENCOUNTER — Other Ambulatory Visit: Payer: Self-pay

## 2011-08-24 ENCOUNTER — Other Ambulatory Visit (HOSPITAL_BASED_OUTPATIENT_CLINIC_OR_DEPARTMENT_OTHER): Payer: Medicaid Other | Admitting: Lab

## 2011-08-24 ENCOUNTER — Ambulatory Visit: Payer: Self-pay

## 2011-08-24 ENCOUNTER — Encounter (HOSPITAL_COMMUNITY)
Admission: RE | Admit: 2011-08-24 | Discharge: 2011-08-24 | Disposition: A | Discharge status: Home / Self Care | Payer: Medicaid Other | Source: Ambulatory Visit | Attending: Oncology | Admitting: Oncology

## 2011-08-24 ENCOUNTER — Ambulatory Visit (HOSPITAL_BASED_OUTPATIENT_CLINIC_OR_DEPARTMENT_OTHER): Payer: Medicaid Other

## 2011-08-24 ENCOUNTER — Encounter: Payer: Self-pay | Admitting: Oncology

## 2011-08-24 ENCOUNTER — Telehealth: Payer: Self-pay | Admitting: Oncology

## 2011-08-24 ENCOUNTER — Ambulatory Visit (HOSPITAL_BASED_OUTPATIENT_CLINIC_OR_DEPARTMENT_OTHER): Payer: Medicaid Other | Admitting: Oncology

## 2011-08-24 VITALS — BP 101/64 | HR 81 | Temp 99.0°F | Resp 18

## 2011-08-24 VITALS — BP 122/62 | HR 95 | Temp 98.4°F | Ht 66.0 in | Wt 249.3 lb

## 2011-08-24 DIAGNOSIS — I82409 Acute embolism and thrombosis of unspecified deep veins of unspecified lower extremity: Secondary | ICD-10-CM

## 2011-08-24 DIAGNOSIS — C55 Malignant neoplasm of uterus, part unspecified: Secondary | ICD-10-CM

## 2011-08-24 DIAGNOSIS — D649 Anemia, unspecified: Secondary | ICD-10-CM

## 2011-08-24 DIAGNOSIS — N390 Urinary tract infection, site not specified: Secondary | ICD-10-CM

## 2011-08-24 DIAGNOSIS — C787 Secondary malignant neoplasm of liver and intrahepatic bile duct: Secondary | ICD-10-CM

## 2011-08-24 LAB — URINE CULTURE
Colony Count: >=100000
Culture  Setup Time: 201305081910
Culture  Setup Time: 201305081910
Special Requests: NORMAL
Special Requests: NORMAL

## 2011-08-24 LAB — CBC WITH DIFFERENTIAL/PLATELET
Basophils Absolute: 0.2 10*3/uL — ABNORMAL HIGH (ref 0.0–0.1)
Eosinophils Absolute: 0.1 10*3/uL (ref 0.0–0.5)
HCT: 25.2 % — ABNORMAL LOW (ref 34.8–46.6)
LYMPH%: 6.5 % — ABNORMAL LOW (ref 14.0–49.7)
MCV: 90.3 fL (ref 79.5–101.0)
MONO%: 6.3 % (ref 0.0–14.0)
NEUT#: 34.4 10*3/uL — ABNORMAL HIGH (ref 1.5–6.5)
NEUT%: 86.5 % — ABNORMAL HIGH (ref 38.4–76.8)
Platelets: 206 10*3/uL (ref 145–400)
RBC: 2.79 10*6/uL — ABNORMAL LOW (ref 3.70–5.45)
nRBC: 0 % (ref 0–0)

## 2011-08-24 LAB — PREPARE RBC (CROSSMATCH)

## 2011-08-24 MED ORDER — SODIUM CHLORIDE 0.9 % IV SOLN
250.0000 mL | Freq: Once | INTRAVENOUS | Status: AC
  Administered 2011-08-24: 250 mL via INTRAVENOUS

## 2011-08-24 MED ORDER — ACETAMINOPHEN 325 MG PO TABS
325.0000 mg | ORAL_TABLET | Freq: Once | ORAL | Status: AC
  Administered 2011-08-24: 325 mg via ORAL

## 2011-08-24 MED ORDER — DIPHENHYDRAMINE HCL 25 MG PO CAPS
25.0000 mg | ORAL_CAPSULE | Freq: Once | ORAL | Status: AC
  Administered 2011-08-24: 25 mg via ORAL

## 2011-08-24 MED ORDER — SODIUM CHLORIDE 0.9 % IJ SOLN
10.0000 mL | INTRAMUSCULAR | Status: DC | PRN
  Filled 2011-08-24: qty 10

## 2011-08-24 MED ORDER — CIPROFLOXACIN HCL 250 MG PO TABS: ORAL_TABLET | ORAL | Status: DC

## 2011-08-24 MED ORDER — FONDAPARINUX SODIUM 5 MG/0.4ML ~~LOC~~ SOLN: 10.0000 mg | SUBCUTANEOUS | Status: DC

## 2011-08-24 MED ORDER — CIPROFLOXACIN HCL 500 MG PO TABS
500.0000 mg | ORAL_TABLET | Freq: Once | ORAL | Status: AC
  Administered 2011-08-24: 500 mg via ORAL
  Filled 2011-08-24: qty 1

## 2011-08-24 MED ORDER — FONDAPARINUX SODIUM 10 MG/0.8ML ~~LOC~~ SOLN
10.0000 mg | Freq: Once | SUBCUTANEOUS | Status: AC
  Administered 2011-08-24: 10 mg via SUBCUTANEOUS
  Filled 2011-08-24: qty 0.8

## 2011-08-24 MED ORDER — HEPARIN SOD (PORK) LOCK FLUSH 100 UNIT/ML IV SOLN
500.0000 [IU] | Freq: Every day | INTRAVENOUS | Status: DC | PRN
  Filled 2011-08-24: qty 5

## 2011-08-24 NOTE — Patient Instructions (Signed)
No more blood thinner shots after today.  Coumadin 5 mg daily beginning today. We will recheck the level on Mon. 5-13.  Antibiotic:  Cipro 250 mg 2 tablets every 12 hrs x 4 doses then 1 tablet every 12 hrs  The antibiotic might make the coumadin level higher even tho we are cutting down the coumadin dose. Call if any persistent or significant bleeding over weekend.  Put ABD pad over each nephrostomy site for more padding.

## 2011-08-24 NOTE — Telephone Encounter (Signed)
Gv pt appt for [id number].  Dr. Darrold Span called gyn onc to have them to schedule appt for pt

## 2011-08-24 NOTE — Patient Instructions (Signed)
Take 5 mg of coumadin daily beginning 08-24-11. Today last dose of arixtra 10 mg.

## 2011-08-24 NOTE — Progress Notes (Signed)
OFFICE PROGRESS NOTE Date of Visit 08-24-2011 Physicians: D.ClarkePearson, W.Brewster, S.Dahlstedt, Interventional Radiology  INTERVAL HISTORY:  Patient is seen, together with mother, in continuing attention to her advanced uterine leiomyosarcoma, having had third cycle of gemzar/taxotere on 4-22 and 08-13-11 with neulasta on 08-14-11. She had restaging CT on 08-20-2011 and bilateral nephrostomy tube changes by IR on 08-22-2011. She has been on arixtra converting to coumadin for RLE DVT since 08-17-11.  History is of heavy menstrual bleeding x years, then 4 months of continuous light vaginal bleeding fall 2012, and constipation. She had also lost 50 lbs in 8 months, which was reportedly intentional. She presented to ED with pain, with CT AP 05-07-2011 in [facility] with 20 x 16.7 x 16 cm pelvic mass thought to be uterus, with second 10.3x10.7x8.8 cm mass adjacent, an 11 x 7.5 cm nodal mass encasing right iliac vessels and severe right hydronephrosis. She was seen by [facility] 05-11-11, then went to exploratory laparotomy with supracervical hysterectomy at [facility] by Dr.Wendy Nelly Rout on 05-22-11. At surgery she had a 24 cm uterus extending to bilateral pelvic sidewalls with an ~ 15 cm retroperitoneal mass. Pathology [id number]) showed multifocal leiomyosarcoma spanning 17 cm, involving uterus and right adnexa, + LVSI. Postoperatively she was tachycardic which improved with 2 units PRBCs; she also had CT angio chest done during that hospitalization 05-24-11 which was negative for PE but did show multiple bilateral pulmonary nodules radiographically consistent with metastases. She was seen in follow up by Dr.Brewster at clinic on 05-31-2011, with increased swelling of right thigh and right back pain; venous doppler was negative for clot RLE. She was seen in new patient consultation by Dr Darrold Span on 06-12-2011, with creatinine up to 3.76, admitted, seen in hospital by urology and bilateral PCNs placed by IR which  likely will be needed long term and which will be managed by IR. She had PAC placed by IR. She began gemzar/taxotere 06-25-11 using this day 1 day 8 q 21 days with neulasta support. She has required PRBC transfusions on several occasions thru this course.  Patient has had significant discomfort around the new bilateral nephrostomy tubes since these were placed 2 days ago, this improved with prn percocet. Urine cultures sent from each nephrostomy at that procedure have resulted with > 100,000 Citrobacter freundii pansensitive on one side and intermediate sens to nitrofurantoin on other side. She has had no fever or chills; urine reportedly was cloudy prior to stent change. She has not had noted bleeding from tube insertion sites or gross hematuria since tube changes. She is scheduled for tube changes by IR again 10-03-11. Note she had similar pain after tubes were placed initially, improved with thick ABD pads over dressings until area less sensitive after a couple of weeks. She will use ABD pads again for now. She will start cipro with dose at [facility] today. She has never had cipro; mother gets rash to this medication and patient understands to stop cipro and call if any rash or other allergic concern.  Patient has not noticed any improvement in RLE swelling since starting anticoagulation. She does keep leg elevated when possible. She is not more SOB and no chest pain. She has had no noted bleeding. INR is therapeutic today on 5 mg then 7.5 mg then10 mg coumadin past 2 days. She will have arixtra today then DC (=day 9 arixtra and day 8 coumadin). With addition of antibiotic also, will decrease coumadin to 5 mg daily thru weekend and recheck on Mon 08-27-11.  She will call prior if any significant bleeding.  We have discussed CT results, tho patient does not want to look at [facility] images. Disease in chest seems fairly stable without clear new lesions, question of liver lesion, disease in pelvis is somewhat smaller  compared to pre-surgery CT. I would like gyn oncology to review this, however as she is tolerating chemo fairly well and with fairly stable disease at least outside of pelvis, we are in agreement with proceeding with cycle 4 gem/taxotere as scheduled beginning 08-27-11.  Patient did have IV iron on 08-06-11, however is more symptomatic again with hgb down to 7.5 on 5-6 and 7.9 today. She agrees to 2 units PRBCs, which we are able to give today.  On Review of Systems otherwise: significant fatigue and shortness of breath on exertion such as walking into office, no shortness of breath sitting in exam room on RA. Unable to sleep last pm with pain at nephrostomy tubes. Appetite fair, taste changes with chemo. No productive cough. Some constipation with increased pain meds, discussed meds. No increased swelling LLE. Remainder of 10 point Review of Systems negative/ unchanged. Objective:  Vital signs in last 24 hours:  BP 122/62  Pulse 95  Temp(Src) 98.4 F (36.9 C) (Oral)  Ht 5\' 6"  (1.676 m)  Wt 249 lb 4.8 oz (113.082 kg)  BMI 40.24 kg/m2  LMP 04/09/2011 Weight is up 18 lbs from 07-30-11. Slowly ambulatory with flank pain and RLE swelling. Alert, seems more calm by completion of visit.  HEENT:mucous membranes moist, pharynx normal without lesions. Alopecia. PERRL, not icteric. No excessive lacrimation. Very poor dentition again noted. LymphaticsCervical, supraclavicular, and axillary nodes normal. Resp: clear to auscultation bilaterally and normal percussion bilaterally Cardio: regular rate and rhythm GI: soft, diminished bowel sounds, obese, not obviously distended, cannot appreciate organomegaly or mass. Extremities: RLE 2-3+ edema not quite tight. LLE trace edema.No swelling UE Neuro:no sensory deficits noted Skin without rash or bruising. Bilateral percutaneous nephrostomy tubes draining clear urine. Dressings removed and insertion sites viewed, no bleeding or purulence, no surrounding  erythems. Small old blood on dressing from left. Antibacterial ointment applied to left and clean dressing applied. Patient to add ABD pads over dressings at home. Portacath without erythema or tenderness Lab Results:   Basename 08/24/11 1003  WBC 39.8*  HGB 7.9*  HCT 25.2*  PLT 206  ANC 34.4, down from 62 on 5-6 (neulasta 08-14-11)  INR today 2.9:  See coumadin dosing above BMET No results found for this basename: NA:2,K:2,CL:2,CO2:2,GLUCOSE:2,BUN:2,CREATININE:2,CALCIUM:2 in the last 72 hours Last Bmet 08-17-11 noted, creat 0.8 Studies/Results:  No results found. IR notes reviewed. I spoke with [facility] pharmacy who confirm no antibiotics administered with IR procedure.  Medications: I have reviewed the patient's current medications.Coumadin and cipro as above. Percocet rewritten.  Assessment/Plan: 1.Metastatic uterine leiomyosarcoma: history as above. Will begin cycle 4 gem/ taxotere on 08-27-11 and I have requested follow up appointment with gyn oncology 2.Bilateral percutanous nephrostomy tubes exchanged this week. Ongoing care by IR appreciated. 3.Citrobacter freundii UTI: begin cipro 4.RLE DVT: anticoagulation as above. Repeat PT/INR at office 08-27-11 5.symptomatic anemia despite IV iron in April, related to chemotherapy and some ongoing blood loss from nephrostomy tubes. Transfuse 2 u PRBCs today. 6. 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   I will see her back at least 5-28 or sooner if needed. Patient and mother had questions answered to their satisfaction and were  in agreement with plan as above. Face to face time 40 min, > 50% in discussion and coordination of care.  Merary Garguilo P, MD   08/24/2011, 1:21 PM

## 2011-08-25 LAB — TYPE AND SCREEN
ABO/RH(D): O POS
Antibody Screen: NEGATIVE
Unit division: 0

## 2011-08-27 ENCOUNTER — Encounter: Payer: Self-pay | Admitting: Oncology

## 2011-08-27 ENCOUNTER — Ambulatory Visit (HOSPITAL_BASED_OUTPATIENT_CLINIC_OR_DEPARTMENT_OTHER): Payer: Medicaid Other

## 2011-08-27 ENCOUNTER — Other Ambulatory Visit (HOSPITAL_BASED_OUTPATIENT_CLINIC_OR_DEPARTMENT_OTHER): Payer: Medicaid Other | Admitting: Lab

## 2011-08-27 ENCOUNTER — Ambulatory Visit: Payer: Self-pay

## 2011-08-27 VITALS — BP 122/69 | HR 89 | Temp 98.4°F

## 2011-08-27 DIAGNOSIS — C55 Malignant neoplasm of uterus, part unspecified: Secondary | ICD-10-CM

## 2011-08-27 DIAGNOSIS — Z5111 Encounter for antineoplastic chemotherapy: Secondary | ICD-10-CM

## 2011-08-27 DIAGNOSIS — I82409 Acute embolism and thrombosis of unspecified deep veins of unspecified lower extremity: Secondary | ICD-10-CM

## 2011-08-27 LAB — CBC WITH DIFFERENTIAL/PLATELET
BASO%: 0.5 % (ref 0.0–2.0)
EOS%: 1.1 % (ref 0.0–7.0)
MCH: 29.6 pg (ref 25.1–34.0)
MCHC: 33.1 g/dL (ref 31.5–36.0)
MCV: 89.5 fL (ref 79.5–101.0)
MONO%: 7.8 % (ref 0.0–14.0)
NEUT%: 80.5 % — ABNORMAL HIGH (ref 38.4–76.8)
RDW: 22.1 % — ABNORMAL HIGH (ref 11.2–14.5)
lymph#: 2.2 10*3/uL (ref 0.9–3.3)

## 2011-08-27 LAB — PROTIME-INR
INR: 2.4 (ref 2.00–3.50)
Protime: 28.8 Seconds — ABNORMAL HIGH (ref 10.6–13.4)

## 2011-08-27 MED ORDER — SODIUM CHLORIDE 0.9 % IV SOLN
Freq: Once | INTRAVENOUS | Status: AC
  Administered 2011-08-27: 09:00:00 via INTRAVENOUS

## 2011-08-27 MED ORDER — SODIUM CHLORIDE 0.9 % IJ SOLN
10.0000 mL | INTRAMUSCULAR | Status: DC | PRN
  Administered 2011-08-27: 10 mL
  Filled 2011-08-27: qty 10

## 2011-08-27 MED ORDER — DEXAMETHASONE SODIUM PHOSPHATE 10 MG/ML IJ SOLN
10.0000 mg | Freq: Once | INTRAMUSCULAR | Status: AC
  Administered 2011-08-27: 10 mg via INTRAVENOUS

## 2011-08-27 MED ORDER — HEPARIN SOD (PORK) LOCK FLUSH 100 UNIT/ML IV SOLN
500.0000 [IU] | Freq: Once | INTRAVENOUS | Status: AC | PRN
  Administered 2011-08-27: 500 [IU]
  Filled 2011-08-27: qty 5

## 2011-08-27 MED ORDER — SODIUM CHLORIDE 0.9 % IV SOLN
900.0000 mg/m2 | Freq: Once | INTRAVENOUS | Status: AC
  Administered 2011-08-27: 2052 mg via INTRAVENOUS
  Filled 2011-08-27: qty 54

## 2011-08-27 MED ORDER — ONDANSETRON 8 MG/50ML IVPB (CHCC)
8.0000 mg | Freq: Once | INTRAVENOUS | Status: AC
  Administered 2011-08-27: 8 mg via INTRAVENOUS

## 2011-08-27 NOTE — Patient Instructions (Signed)
[facility] Discharge Instructions for Patients Receiving Chemotherapy  Today you received the following chemotherapy agents Gemzar.  To help prevent nausea and vomiting after your treatment, we encourage you to take your nausea medication as ordered per MD.    If you develop nausea and vomiting that is not controlled by your nausea medication, call the clinic. If it is after clinic hours your family physician or the after hours number for the clinic or go to the Emergency Department.   BELOW ARE SYMPTOMS THAT SHOULD BE REPORTED IMMEDIATELY:  *FEVER GREATER THAN 100.5 F  *CHILLS WITH OR WITHOUT FEVER  NAUSEA AND VOMITING THAT IS NOT CONTROLLED WITH YOUR NAUSEA MEDICATION  *UNUSUAL SHORTNESS OF BREATH  *UNUSUAL BRUISING OR BLEEDING  TENDERNESS IN MOUTH AND THROAT WITH OR WITHOUT PRESENCE OF ULCERS  *URINARY PROBLEMS  *BOWEL PROBLEMS  UNUSUAL RASH Items with * indicate a potential emergency and should be followed up as soon as possible.   Please let the nurse know about any problems that you may have experienced. Feel free to call the clinic you have any questions or concerns. The clinic phone number is [phone].   I have been informed and understand all the instructions given to me. I know to contact the clinic, my physician, or go to the Emergency Department if any problems should occur. I do not have any questions at this time, but understand that I may call the clinic during office hours   should I have any questions or need assistance in obtaining follow up care.    __________________________________________  _____________  __________ Signature of Patient or Authorized Representative            Date                   Time    __________________________________________ Nurse's Signature    

## 2011-08-27 NOTE — Progress Notes (Signed)
Patient received two prescriptions from [facility] pharmacy on 08/24/11 $6.70,her remaninig balance [facility] $178.77.

## 2011-08-27 NOTE — Patient Instructions (Signed)
CONTINUE COUMADIN 5 MG NIGHTLY UNTIL RECHECKED 09-03-11.

## 2011-08-28 ENCOUNTER — Other Ambulatory Visit: Payer: Self-pay

## 2011-08-28 DIAGNOSIS — C55 Malignant neoplasm of uterus, part unspecified: Secondary | ICD-10-CM

## 2011-08-28 MED ORDER — OXYCODONE-ACETAMINOPHEN 5-325 MG PO TABS: ORAL_TABLET | ORAL | Status: DC

## 2011-08-29 ENCOUNTER — Other Ambulatory Visit: Payer: Self-pay | Admitting: Certified Registered Nurse Anesthetist

## 2011-08-31 ENCOUNTER — Telehealth: Payer: Self-pay | Admitting: *Deleted

## 2011-08-31 NOTE — Telephone Encounter (Signed)
patient confirmed over the phone the new date and time with dr. Nelly Rout on 09-13-2011 at 2:00pm

## 2011-09-02 ENCOUNTER — Encounter: Payer: Self-pay | Admitting: Oncology

## 2011-09-02 NOTE — Progress Notes (Signed)
Status including recent CT communicated to gyn oncologist. They agree with continuing same regimen chemotherapy as long as no progression and if she is tolerating; consider repeat CT after 6 cycles. Doubt benefit from pelvic RT for mass 18 x 16 cm.

## 2011-09-03 ENCOUNTER — Ambulatory Visit: Payer: Self-pay

## 2011-09-03 ENCOUNTER — Other Ambulatory Visit (HOSPITAL_BASED_OUTPATIENT_CLINIC_OR_DEPARTMENT_OTHER): Payer: Medicaid Other | Admitting: Lab

## 2011-09-03 ENCOUNTER — Ambulatory Visit (HOSPITAL_BASED_OUTPATIENT_CLINIC_OR_DEPARTMENT_OTHER): Payer: Medicaid Other

## 2011-09-03 VITALS — BP 122/66 | HR 88 | Temp 98.3°F

## 2011-09-03 DIAGNOSIS — C55 Malignant neoplasm of uterus, part unspecified: Secondary | ICD-10-CM

## 2011-09-03 DIAGNOSIS — Z5111 Encounter for antineoplastic chemotherapy: Secondary | ICD-10-CM

## 2011-09-03 DIAGNOSIS — N390 Urinary tract infection, site not specified: Secondary | ICD-10-CM

## 2011-09-03 DIAGNOSIS — C7800 Secondary malignant neoplasm of unspecified lung: Secondary | ICD-10-CM

## 2011-09-03 DIAGNOSIS — I82409 Acute embolism and thrombosis of unspecified deep veins of unspecified lower extremity: Secondary | ICD-10-CM

## 2011-09-03 LAB — CBC WITH DIFFERENTIAL/PLATELET
Basophils Absolute: 0 10*3/uL (ref 0.0–0.1)
EOS%: 0 % (ref 0.0–7.0)
Eosinophils Absolute: 0 10*3/uL (ref 0.0–0.5)
HGB: 8.9 g/dL — ABNORMAL LOW (ref 11.6–15.9)
LYMPH%: 12.8 % — ABNORMAL LOW (ref 14.0–49.7)
MCH: 29.5 pg (ref 25.1–34.0)
MCV: 88.7 fL (ref 79.5–101.0)
MONO%: 7.9 % (ref 0.0–14.0)
Platelets: 419 10*3/uL — ABNORMAL HIGH (ref 145–400)
RDW: 20.2 % — ABNORMAL HIGH (ref 11.2–14.5)

## 2011-09-03 LAB — COMPREHENSIVE METABOLIC PANEL
Albumin: 3.5 g/dL (ref 3.5–5.2)
Alkaline Phosphatase: 60 U/L (ref 39–117)
BUN: 10 mg/dL (ref 6–23)
CO2: 22 mEq/L (ref 19–32)
Calcium: 9.2 mg/dL (ref 8.4–10.5)
Chloride: 107 mEq/L (ref 96–112)
Glucose, Bld: 99 mg/dL (ref 70–99)
Potassium: 4.1 mEq/L (ref 3.5–5.3)
Sodium: 141 mEq/L (ref 135–145)
Total Protein: 5.9 g/dL — ABNORMAL LOW (ref 6.0–8.3)

## 2011-09-03 LAB — PROTIME-INR: Protime: 22.8 Seconds — ABNORMAL HIGH (ref 10.6–13.4)

## 2011-09-03 MED ORDER — SODIUM CHLORIDE 0.9 % IJ SOLN
10.0000 mL | INTRAMUSCULAR | Status: DC | PRN
  Administered 2011-09-03: 10 mL
  Filled 2011-09-03: qty 10

## 2011-09-03 MED ORDER — DOCETAXEL CHEMO INJECTION 160 MG/16ML
100.0000 mg/m2 | Freq: Once | INTRAVENOUS | Status: AC
  Administered 2011-09-03: 230 mg via INTRAVENOUS
  Filled 2011-09-03: qty 23

## 2011-09-03 MED ORDER — SODIUM CHLORIDE 0.9 % IV SOLN
Freq: Once | INTRAVENOUS | Status: AC
  Administered 2011-09-03: 09:00:00 via INTRAVENOUS

## 2011-09-03 MED ORDER — ONDANSETRON 16 MG/50ML IVPB (CHCC)
16.0000 mg | Freq: Once | INTRAVENOUS | Status: AC
  Administered 2011-09-03: 16 mg via INTRAVENOUS

## 2011-09-03 MED ORDER — DEXAMETHASONE SODIUM PHOSPHATE 4 MG/ML IJ SOLN
20.0000 mg | Freq: Once | INTRAMUSCULAR | Status: AC
  Administered 2011-09-03: 20 mg via INTRAVENOUS

## 2011-09-03 MED ORDER — GEMCITABINE HCL CHEMO INJECTION 1 GM
900.0000 mg/m2 | Freq: Once | INTRAVENOUS | Status: AC
  Administered 2011-09-03: 2052 mg via INTRAVENOUS
  Filled 2011-09-03: qty 54

## 2011-09-03 MED ORDER — HEPARIN SOD (PORK) LOCK FLUSH 100 UNIT/ML IV SOLN
500.0000 [IU] | Freq: Once | INTRAVENOUS | Status: AC | PRN
  Administered 2011-09-03: 500 [IU]
  Filled 2011-09-03: qty 5

## 2011-09-03 NOTE — Patient Instructions (Signed)
Take 6 mg of coumadin daiy.  Pt. Given samples of 1 mg tabs from pharmacy.

## 2011-09-03 NOTE — Progress Notes (Signed)
Patient continues to have swelling in right leg; plus 3 pitting edema noted, skin tight;'faint' redness noted; states that she gets a 'burning' sensation when scratching leg; change noted this weekend; advised patient to call if symptoms continue to worsen; verbalized understanding.

## 2011-09-04 ENCOUNTER — Ambulatory Visit (HOSPITAL_BASED_OUTPATIENT_CLINIC_OR_DEPARTMENT_OTHER): Payer: Medicaid Other

## 2011-09-04 VITALS — BP 114/62 | HR 91 | Temp 97.3°F

## 2011-09-04 DIAGNOSIS — C55 Malignant neoplasm of uterus, part unspecified: Secondary | ICD-10-CM

## 2011-09-04 MED ORDER — PEGFILGRASTIM INJECTION 6 MG/0.6ML
6.0000 mg | Freq: Once | SUBCUTANEOUS | Status: AC
  Administered 2011-09-04: 6 mg via SUBCUTANEOUS
  Filled 2011-09-04: qty 0.6

## 2011-09-05 ENCOUNTER — Telehealth: Payer: Self-pay

## 2011-09-05 ENCOUNTER — Other Ambulatory Visit: Payer: Self-pay | Admitting: Oncology

## 2011-09-05 NOTE — Telephone Encounter (Signed)
Called pt to inform her per Dr. Darrold Span, her urine culture shows no UTI now, so no more antibiotics needed now, which is good!  Pt verbalizes understanding.

## 2011-09-11 ENCOUNTER — Ambulatory Visit (HOSPITAL_BASED_OUTPATIENT_CLINIC_OR_DEPARTMENT_OTHER): Payer: Medicaid Other

## 2011-09-11 ENCOUNTER — Other Ambulatory Visit: Payer: Self-pay

## 2011-09-11 ENCOUNTER — Other Ambulatory Visit (HOSPITAL_BASED_OUTPATIENT_CLINIC_OR_DEPARTMENT_OTHER): Payer: Medicaid Other | Admitting: Lab

## 2011-09-11 ENCOUNTER — Telehealth: Payer: Self-pay | Admitting: Oncology

## 2011-09-11 ENCOUNTER — Ambulatory Visit (HOSPITAL_BASED_OUTPATIENT_CLINIC_OR_DEPARTMENT_OTHER): Payer: Medicaid Other | Admitting: Oncology

## 2011-09-11 ENCOUNTER — Other Ambulatory Visit: Payer: Self-pay | Admitting: *Deleted

## 2011-09-11 VITALS — BP 126/65 | HR 106 | Temp 97.7°F | Ht 66.0 in | Wt 258.4 lb

## 2011-09-11 VITALS — BP 106/68 | HR 89 | Temp 98.6°F | Resp 18

## 2011-09-11 DIAGNOSIS — D649 Anemia, unspecified: Secondary | ICD-10-CM

## 2011-09-11 DIAGNOSIS — N390 Urinary tract infection, site not specified: Secondary | ICD-10-CM

## 2011-09-11 DIAGNOSIS — C55 Malignant neoplasm of uterus, part unspecified: Secondary | ICD-10-CM

## 2011-09-11 DIAGNOSIS — Z832 Family history of diseases of the blood and blood-forming organs and certain disorders involving the immune mechanism: Secondary | ICD-10-CM

## 2011-09-11 DIAGNOSIS — I82409 Acute embolism and thrombosis of unspecified deep veins of unspecified lower extremity: Secondary | ICD-10-CM

## 2011-09-11 DIAGNOSIS — C7800 Secondary malignant neoplasm of unspecified lung: Secondary | ICD-10-CM

## 2011-09-11 LAB — PROTIME-INR: INR: 3.1 (ref 2.00–3.50)

## 2011-09-11 LAB — URINALYSIS, MICROSCOPIC - CHCC
Bilirubin (Urine): NEGATIVE
Glucose: NEGATIVE g/dL
Ketones: NEGATIVE mg/dL
Ketones: NEGATIVE mg/dL
Protein: 100 mg/dL
pH: 6 (ref 4.6–8.0)
pH: 6 (ref 4.6–8.0)

## 2011-09-11 LAB — CBC WITH DIFFERENTIAL/PLATELET
Eosinophils Absolute: 0.2 10*3/uL (ref 0.0–0.5)
HCT: 24 % — ABNORMAL LOW (ref 34.8–46.6)
LYMPH%: 7.9 % — ABNORMAL LOW (ref 14.0–49.7)
MONO#: 2.4 10*3/uL — ABNORMAL HIGH (ref 0.1–0.9)
NEUT#: 30.5 10*3/uL — ABNORMAL HIGH (ref 1.5–6.5)
NEUT%: 84.3 % — ABNORMAL HIGH (ref 38.4–76.8)
Platelets: 98 10*3/uL — ABNORMAL LOW (ref 145–400)
WBC: 36.2 10*3/uL — ABNORMAL HIGH (ref 3.9–10.3)
nRBC: 1 % — ABNORMAL HIGH (ref 0–0)

## 2011-09-11 MED ORDER — DIPHENHYDRAMINE HCL 25 MG PO CAPS
25.0000 mg | ORAL_CAPSULE | Freq: Once | ORAL | Status: AC
  Administered 2011-09-11: 25 mg via ORAL

## 2011-09-11 MED ORDER — SODIUM CHLORIDE 0.9 % IV SOLN: 250.0000 mL | Freq: Once | INTRAVENOUS | Status: DC

## 2011-09-11 MED ORDER — CIPROFLOXACIN HCL 250 MG PO TABS: 250.0000 mg | ORAL_TABLET | Freq: Two times a day (BID) | ORAL | Status: DC

## 2011-09-11 MED ORDER — LORAZEPAM 0.5 MG PO TABS: ORAL_TABLET | ORAL | Status: DC

## 2011-09-11 MED ORDER — SODIUM CHLORIDE 0.9 % IJ SOLN
3.0000 mL | INTRAMUSCULAR | Status: AC | PRN
  Administered 2011-09-11: 10 mL
  Filled 2011-09-11: qty 10

## 2011-09-11 MED ORDER — HEPARIN SOD (PORK) LOCK FLUSH 100 UNIT/ML IV SOLN
500.0000 [IU] | Freq: Every day | INTRAVENOUS | Status: AC | PRN
  Administered 2011-09-11: 500 [IU]
  Filled 2011-09-11: qty 5

## 2011-09-11 MED ORDER — ACETAMINOPHEN 325 MG PO TABS
650.0000 mg | ORAL_TABLET | Freq: Once | ORAL | Status: AC
  Administered 2011-09-11: 650 mg via ORAL

## 2011-09-11 NOTE — Patient Instructions (Addendum)
No coumadin today On Wed 5-29, take 5 mg coumadin We will recheck coumadin level on 5-30 when you are here to see Dr Nelly Rout and again when you see Dr Darrold Span on June 4  Take percocet ~ 30 min before you go to bed each night

## 2011-09-11 NOTE — Progress Notes (Signed)
Urine cultures ordered per Dr. Darrold Span.  Spoke with patient and told her about the Atb cipro being sent in for a week since ua looks suspious. Will follow up on cultures.

## 2011-09-11 NOTE — Telephone Encounter (Signed)
appts made and printed for pt aom °

## 2011-09-11 NOTE — Patient Instructions (Signed)
Blood Transfusion Information  WHAT IS A BLOOD TRANSFUSION?  A transfusion is the replacement of blood or some of its parts. Blood is made up of multiple cells which provide different functions.   Red blood cells carry oxygen and are used for blood loss replacement.   White blood cells fight against infection.   Platelets control bleeding.   Plasma helps clot blood.   Other blood products are available for specialized needs, such as hemophilia or other clotting disorders.  BEFORE THE TRANSFUSION   Who gives blood for transfusions?    You may be able to donate blood to be used at a later date on yourself (autologous donation).   Relatives can be asked to donate blood. This is generally not any safer than if you have received blood from a stranger. The same precautions are taken to ensure safety when a relative's blood is donated.   Healthy volunteers who are fully evaluated to make sure their blood is safe. This is blood bank blood.  Transfusion therapy is the safest it has ever been in the practice of medicine. Before blood is taken from a donor, a complete history is taken to make sure that person has no history of diseases nor engages in risky social behavior (examples are intravenous drug use or sexual activity with multiple partners). The donor's travel history is screened to minimize risk of transmitting infections, such as malaria. The donated blood is tested for signs of infectious diseases, such as HIV and hepatitis. The blood is then tested to be sure it is compatible with you in order to minimize the chance of a transfusion reaction. If you or a relative donates blood, this is often done in anticipation of surgery and is not appropriate for emergency situations. It takes many days to process the donated blood.  RISKS AND COMPLICATIONS  Although transfusion therapy is very safe and saves many lives, the main dangers of transfusion include:    Getting an infectious disease.   Developing a  transfusion reaction. This is an allergic reaction to something in the blood you were given. Every precaution is taken to prevent this.  The decision to have a blood transfusion has been considered carefully by your caregiver before blood is given. Blood is not given unless the benefits outweigh the risks.  AFTER THE TRANSFUSION   Right after receiving a blood transfusion, you will usually feel much better and more energetic. This is especially true if your red blood cells have gotten low (anemic). The transfusion raises the level of the red blood cells which carry oxygen, and this usually causes an energy increase.   The nurse administering the transfusion will monitor you carefully for complications.  HOME CARE INSTRUCTIONS   No special instructions are needed after a transfusion. You may find your energy is better. Speak with your caregiver about any limitations on activity for underlying diseases you may have.  SEEK MEDICAL CARE IF:    Your condition is not improving after your transfusion.   You develop redness or irritation at the intravenous (IV) site.  SEEK IMMEDIATE MEDICAL CARE IF:   Any of the following symptoms occur over the next 12 hours:   Shaking chills.   You have a temperature by mouth above 102 F (38.9 C), not controlled by medicine.   Chest, back, or muscle pain.   People around you feel you are not acting correctly or are confused.   Shortness of breath or difficulty breathing.   Dizziness and fainting.     You get a rash or develop hives.   You have a decrease in urine output.   Your urine turns a dark color or changes to pink, red, or brown.  Any of the following symptoms occur over the next 10 days:   You have a temperature by mouth above 102 F (38.9 C), not controlled by medicine.   Shortness of breath.   Weakness after normal activity.   The white part of the eye turns yellow (jaundice).   You have a decrease in the amount of urine or are urinating less often.   Your  urine turns a dark color or changes to pink, red, or brown.  Document Released: 03/30/2000 Document Revised: 03/22/2011 Document Reviewed: 11/17/2007  [employer] Patient Information 2012 [employer].

## 2011-09-12 ENCOUNTER — Encounter: Payer: Self-pay | Admitting: Oncology

## 2011-09-12 ENCOUNTER — Ambulatory Visit: Payer: Self-pay

## 2011-09-12 ENCOUNTER — Other Ambulatory Visit: Payer: Self-pay

## 2011-09-12 DIAGNOSIS — C55 Malignant neoplasm of uterus, part unspecified: Secondary | ICD-10-CM

## 2011-09-12 LAB — TYPE AND SCREEN: ABO/RH(D): O POS

## 2011-09-12 NOTE — Progress Notes (Signed)
Patient received two prescriptions from [place] [facility] pharmacy on 09/11/11 $6.00,her remaninig balance [facility] $169.77.

## 2011-09-12 NOTE — Patient Instructions (Signed)
Hold coumadin 09-11-11. PLTS. = 98K  Take 5 mg 09-12-10.(Pt. Has 5 mg and 1 mg tabs at home) Repeat PT?INR Thurs. 09-13-11.

## 2011-09-12 NOTE — Progress Notes (Signed)
OFFICE PROGRESS NOTE Date of Visit 09-11-2011 Physicians: D.ClarkePearson/W.Brewster, Interventional Radiology, S.Dahlstedt  INTERVAL HISTORY:  Patient is seen, together with mother, in scheduled follow up of her metastatic uterine leiomyosarcoma, having had fourth cycle of taxotere/gemzar on 5-13 and 09-03-11 with neulasta on 09-04-11. She is on coumadin for RLE DVT and has bilateral percutaneous nephrostomy tubes.  History is of heavy menstrual bleeding x years, then 4 months of continuous light vaginal bleeding fall 2012, and constipation. She had also lost 50 lbs in 8 months, which was reportedly intentional. She presented to ED with pain, with CT AP 05-07-2011 in [facility] system with 20 x 16.7 x 16 cm pelvic mass thought to be uterus, with second 10.3x10.7x8.8 cm mass adjacent, an 11 x 7.5 cm nodal mass encasing right iliac vessels and severe right hydronephrosis. She was seen by [facility] 05-11-11, then went to exploratory laparotomy with supracervical hysterectomy at [facility] by Dr.Wendy Nelly Rout on 05-22-11. At surgery she had a 24 cm uterus extending to bilateral pelvic sidewalls with an ~ 15 cm retroperitoneal mass. Pathology [id number]) showed multifocal leiomyosarcoma spanning 17 cm, involving uterus and right adnexa, + LVSI. Postoperatively she was tachycardic which improved with 2 units PRBCs; she also had CT angio chest done during that hospitalization 05-24-11 which was negative for PE but did show multiple bilateral pulmonary nodules radiographically consistent with metastases. She was seen in follow up by Dr.Brewster at clinic on 05-31-2011, with increased swelling of right thigh and right back pain; venous doppler was negative for clot RLE. She was seen in new patient consultation by Dr Darrold Span on 06-12-2011, with creatinine up to 3.76, admitted, seen in hospital by urology and bilateral PCNs placed by IR which likely will be needed long term and which will be managed by IR. She had PAC placed  by IR. She began gemzar/taxotere 06-25-11 using this day 1 day 8 q 21 days with neulasta support. She has required PRBC transfusions on several occasions thru this course and had IV iron on 08-06-11. She was documented to have new RLE DVT on 08-17-11. Restaging CT 08-20-11 had fairly stable disease in chest and still significant involvement in pelvis. She is to see Dr Nelly Rout later this week.  Patient is mostly uncomfortable from RLE swelling and pain, which is keeping her from sleeping. She is not using prn Percocet regularly and I have told her to use this qhs for now. She has some discomfort at left PCN, in part related to skin reaction from tape, and thinks the urine has bad odor "like when I had infection". She has had no fever. She has some nausea and po intake is marginal, is drinking fluids. She denies cough and is not short of breath at rest. She had epistaxis in past 2-3 days which went on for several hours from right nares; we have discussed not dislodging clot and I have told her to go to ED if bleeding will not stop. INR is a little high and coumadin will be adjusted. Bowels have moved.  Remainder of 10 point Review of Systems negative/ unchanged   Objective:  Vital signs in last 24 hours:  BP 126/65  Pulse 106  Temp(Src) 97.7 F (36.5 C) (Oral)  Ht 5\' 6"  (1.676 m)  Wt 258 lb 6.4 oz (117.209 kg)  BMI 41.71 kg/m2  LMP 04/09/2011 Weight is charted up 9 lbs. Ambulatory without assistance. Respirations not labored RA.  HEENT:mucous membranes moist, pharynx normal without lesions. Poor dentition. PERRL. Alopecia LymphaticsCervical, supraclavicular, and axillary  nodes normal. Resp: clear to auscultation bilaterally and normal percussion bilaterally Cardio: regular rate and rhythm GI: full, soft, not tender, obese, no HSM or masses palpable, quiet. Extremities: 3+ swelling RLE below knee, 2+ in thigh.  LLE no pitting edema, cords, tenderness. Minimal erythema lower right leg above ankle, not  hot, no streaking. Neuro:nonfocal PCNs with clean dry dressings. Insertion sites not remarkable. Skin irritation from tape especially on left. Area around inferior suture on left slight erythema.Urine clear in bilateral bags. Portacat without erythema or tenderness, right chest.  Lab Results:   Basename 09/11/11 1146  WBC 36.2*  HGB 7.9*  HCT 24.0*  PLT 98*  ANC 27.7 post neulasta INR 3.1 on 6 mg daily since 08-21-11. She will hold coumadin today then resume 5 mg daily on 5-29 and we will recheck INR on 5-30 when she is here to see Dr Nelly Rout. BMET Last CMET 09-03-11 Urine samples sent Studies/Results:  No results found.  Medications: I have reviewed the patient's current medications. Coumadin change as above Patient agrees to 1 unit PRBCs which will be given today. Assessment/Plan: 1.advanced uterine leiomyosarcoma: now post 4 cycles of taxotere/ gemzar, still with significant disease by CT tho difficult for me to tell if this is actually worse. She will follow up with gyn onc as scheduled and I will see her back 09-18-11 before another treatment is set up. 2.RLE DVT on coumadin, still significant discomfort. 3.multifactorial anemia, symptomatic with fatigue with exertion. 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

## 2011-09-13 ENCOUNTER — Encounter: Payer: Self-pay | Admitting: Gynecologic Oncology

## 2011-09-13 ENCOUNTER — Encounter (HOSPITAL_BASED_OUTPATIENT_CLINIC_OR_DEPARTMENT_OTHER): Payer: Medicaid Other | Admitting: Lab

## 2011-09-13 ENCOUNTER — Telehealth: Payer: Self-pay | Admitting: *Deleted

## 2011-09-13 ENCOUNTER — Other Ambulatory Visit: Payer: Medicaid Other | Admitting: Lab

## 2011-09-13 ENCOUNTER — Ambulatory Visit: Payer: Medicaid Other | Admitting: Gynecologic Oncology

## 2011-09-13 VITALS — BP 108/60 | HR 78 | Temp 99.2°F | Resp 20 | Ht 65.83 in | Wt 263.3 lb

## 2011-09-13 DIAGNOSIS — Z7901 Long term (current) use of anticoagulants: Secondary | ICD-10-CM

## 2011-09-13 DIAGNOSIS — Z5181 Encounter for therapeutic drug level monitoring: Secondary | ICD-10-CM

## 2011-09-13 DIAGNOSIS — I82409 Acute embolism and thrombosis of unspecified deep veins of unspecified lower extremity: Secondary | ICD-10-CM

## 2011-09-13 DIAGNOSIS — C55 Malignant neoplasm of uterus, part unspecified: Secondary | ICD-10-CM

## 2011-09-13 LAB — CBC WITH DIFFERENTIAL/PLATELET
Basophils Absolute: 0.1 10*3/uL (ref 0.0–0.1)
Eosinophils Absolute: 0.2 10*3/uL (ref 0.0–0.5)
HCT: 26.8 % — ABNORMAL LOW (ref 34.8–46.6)
HGB: 9.1 g/dL — ABNORMAL LOW (ref 11.6–15.9)
LYMPH%: 6.8 % — ABNORMAL LOW (ref 14.0–49.7)
MCV: 89.3 fL (ref 79.5–101.0)
MONO#: 2.5 10*3/uL — ABNORMAL HIGH (ref 0.1–0.9)
MONO%: 7.5 % (ref 0.0–14.0)
NEUT#: 27.7 10*3/uL — ABNORMAL HIGH (ref 1.5–6.5)
Platelets: 135 10*3/uL — ABNORMAL LOW (ref 145–400)
WBC: 32.7 10*3/uL — ABNORMAL HIGH (ref 3.9–10.3)
nRBC: 1 % — ABNORMAL HIGH (ref 0–0)

## 2011-09-13 NOTE — Patient Instructions (Signed)
followup in 2 months after she's received cycle 6 of Gemzar and Taxotere

## 2011-09-13 NOTE — Progress Notes (Signed)
Consult Note: Gyn-Onc   Jackie Cook 51 y.o. female  Chief Complaint  Patient presents with  . Uterine cancer    Follow up     HPI:  51 year old white female presented with an abdominal pelvic mass. Subsequently she's had a CT scan which shows a 20 x 16 x 16 cm abdominal pelvic mass. This is thought to be a large uterus with a central area of low attenuation consistent with necrosis. There is some evidence of a pedunculated mass measuring 10 x 8.8 x 10.7 cm posterior to the main lesion along the right iliac vessels is a large nodal mass measuring 11 x 7.5 cm which encased the iliac vessels and ureter creating a right hydronephrosis which is severe. The liver spleen and adrenal glands and left kidney are otherwise unremarkable there is no evidence of ascites. The patient denies any past gynecologic history. Chest x-ray was notable for 2 metastatic lesions.   On February 5 she underwent an exploratory laparotomy.  Operative findings were notable for a 24 cm uterus extending to bilateral pelvic sidewalls. The cervix was deviated towards the right. A proximately 15 cm retroperitoneal mass was noted extending from the level of the cervix to the bifurcation of the right iliac vessels. Tumor was also noted to be extruding from the right cornua and adherent to the retroperitoneal mass.  She underwent supracervical hysterectomy  bilateral salpingo-oophorectomy. 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 - TUMOR INVOLVES UTERUS AND RIGHT ADNEXA - LYMPHOVASCULAR INVASION IS IDENTIFIED. - SEE ONCOLOGY TABLE BELOW. Microscopic Comment UTERUS Specimen: Uterus and bilateral adnexa. Procedure: Supracervical hysterectomy and bilateral salpingo-oophorectomy. Lymph node sampling performed: No. Specimen integrity: Intact. Maximum tumor size (gross measurement): At least 17.0 cm. Histologic type:  Leiomyosarcoma. Grade: High grade. Myometrial invasion: N/A. Cervical stromal involvement: Not identified. Extent of involvement of other organs: Involves the right adnexa. Lymph vascular invasion: Present, scattered foci. Peritoneal washings: N/A. Lymph nodes: Number examined 0; number positive N/A. TNM code: pT2a, pNX, at least. FIGO Stage (based on pathologic findings, needs clinical correlation): IIA, at least. Comments: There are multiple foci of leiomyosarcoma; the largest grossly spans 17.0 cm and is present in the myometrium. In addition to this largest nodule, there are two other grossly identified sarcomas in the right para adnexa tissue, spanning 6.0 and 4.5 cm. The myometrium also contains benign leiomyomata and the endometrium contains two benign endometrioid polyps.  INTERVAL HISTORY:   The patient has received 4 cycles of Gemzar and Taxotere. There has been no reduction in the size of this tumor. A CT scan on 08/20/2011 is notable for the pulmonary nodules as previously noted. There is persistent right pelvic mass measures 18.2 x 16.9 cm this mass is in close proximity to right internal and external adenopathy with the [facility] mass measuring 28.2 x 21.3 cm. There is subtle areas within the right lobe of the liver measuring 1 cm that is new but at this time cannot be distinguished from steatosis. The patient at this time has bilateral nephrostomy catheters has had an interval diagnosis of a right deep venous thrombosis for which she is on Coumadin therapy. She presents with complaints of new left-sided lower extremity edema confined to the foot.  Allergies  Allergen Reactions  . Aspirin     GI upset  . Morphine And Related Itching  . Penicillins Itching    Past Medical History  Diagnosis Date  . Anemia  Iron deficiency  . Blood transfusion     2001  . H/O hiatal hernia   . H/O menorrhagia   . DDD (degenerative disc disease)   . Malignant neoplasm of body of  uterus 05/31/2011    metastatic leiomyosarcoma of uterus    Past Surgical History  Procedure Date  . Total abdominal hysterectomy w/ bilateral salpingoophorectomy 05/22/11  . Laparotomy 05/22/2011    Procedure: EXPLORATORY LAPAROTOMY;  Surgeon: Laurette Schimke, MD PHD;  Location: [facility];  Service: Gynecology;  Laterality: N/A;  . Abdominal hysterectomy 05/22/2011    Procedure: HYSTERECTOMY ABDOMINAL;  Surgeon: Laurette Schimke, MD PHD;  Location: [facility];  Service: Gynecology;  Laterality: N/A;  with Resection of Pelvic Mass    . Salpingoophorectomy 05/22/2011    Procedure: SALPINGO OOPHERECTOMY;  Surgeon: Laurette Schimke, MD PHD;  Location: [facility];  Service: Gynecology;  Laterality: Bilateral;  . Portacath placement 06/18/2011    Dr. Mervyn Skeeters. Hoss, 8 Fr, Rt. port w/ tip in SVC    Current Outpatient Prescriptions  Medication Sig Dispense Refill  . ciprofloxacin (CIPRO) 250 MG tablet Take 1 tablet (250 mg total) by mouth 2 (two) times daily.  14 tablet  0  . dexamethasone (DECADRON) 4 MG tablet Take 2 tablets (8 mg total) by mouth 2 (two) times daily with a meal. 2 tablets ( 8 mg) by mouth twice a day, the day before, the day of and the day after chemotherapy  12 tablet  1  . docusate sodium (COLACE) 100 MG capsule Take 200 mg by mouth daily.       . ferrous fumarate (HEMOCYTE - 106 MG FE) 325 (106 FE) MG TABS Take 1 tablet (106 mg of iron total) by mouth 2 (two) times daily.  60 each  2  . LORazepam (ATIVAN) 0.5 MG tablet Take 1-2 q6h prn nausea or to relax  30 tablet  1  . magnesium hydroxide (PHILLIPS MILK OF MAGNESIA) 400 MG/5ML suspension Take 5 mLs by mouth daily as needed.      . Multiple Vitamins-Minerals (MULTIVITAMIN PO) Take 1 tablet by mouth daily.      Marland Kitchen oxyCODONE-acetaminophen (PERCOCET) 5-325 MG per tablet TAKE 1-2 TABS EVERY 4-6 HOURS AS NEEDED FOR PAIN  40 tablet  0  . pantoprazole (PROTONIX) 40 MG tablet Take 1 tablet (40 mg total) by mouth daily.  30 tablet  2  . polyethylene glycol powder  (MIRALAX) powder Take 17 g by mouth daily.  255 g  1  . promethazine (PHENERGAN) 25 MG tablet Take 1 tablet (25 mg total) by mouth every 6 (six) hours as needed for nausea.  30 tablet  0  . traMADol (ULTRAM) 50 MG tablet Take 2 tablets (100 mg total) by mouth every 6 (six) hours as needed for pain.  120 tablet  0  . warfarin (COUMADIN) 5 MG tablet 1 tablet by mouth daily between 4 pm and 6 pm, or as directed  50 tablet  1  . promethazine (PHENERGAN) 25 MG tablet Take 25 mg by mouth every 6 (six) hours as needed. For nausea.        History   Social History  . Marital Status: Single    Spouse Name: N/A    Number of Children: 0  . Years of Education: 12   Occupational History  . Unemployed   . Has done dog sitting in the past.    Social History Main Topics  . Smoking status: Former Smoker -- 1.0 packs/day for 34  years    Quit date: 05/22/2011  . Smokeless tobacco: Never Used  . Alcohol Use: No  . Drug Use: No  . Sexually Active: No   Other Topics Concern  . Not on file   Social History Narrative   Single.  Lives with a friend.  Independent of ADLs and ambulation.    Family History  Problem Relation Age of Onset  . Breast cancer Maternal Aunt   . Heart attack Other   . Lung cancer Father   .       Review of Systems: Reports epigastric pain anorexia.   Reports bilateral back pain, edema of bilateral lower extremities right significantly more than the left but with new left-sided edema , she reports normal bowel movements denies any vaginal bleeding, denies shortness of breath.      Vitals: Blood pressure 108/60, pulse 78, temperature 99.2 F (37.3 C), temperature source Oral, resp. rate 20, height 5' 5.83" (1.672 m), weight 263 lb 4.8 oz (119.432 kg), last menstrual period 04/09/2011.  Physical Exam: A pleasant but anxious white female in significant distress with multiple episodes of crying. HEENT is negative  Lymph node survey: There is no supraclavicular or inguinal  adenopathy.  Abdomen:  soft nontender.  Midline incision healing well without any evidence of wound separation or discharge Back: Bilateral nephrostomy catheters appreciated no CVA tenderness; Extremities: 3-4+ right lower extremity edema; 2+ left lower extremity edema extending to the knee  .Assessment/Plan: Stage IV uterine leiomyosarcoma. Patient has significant residual disease in the right retroperitoneal area with obstruction of the ureter. There is also noticeable new edema of the left leg. A Doppler ultrasound has been ordered.  This patient has had limited response so the 4 of 6 planned cycles of Taxotere and Gemzar. The options discussed with the patient were #1 continuation of the chemotherapy for stable disease #2 consideration for a radiation oncology consult to see if there may be any utility or palliation, and #3. Hospice.  The patient and her mother are aware that there is not another chemotherapeutic agent that has a response rate greater than what she is receiving currently. There also aware that it is unlikely that the patient will be cured of this malignancy. The patient at this time wishes to continue to optimize her treatment options.  I suggested to her that after completion of 6 cycles and nontender CT scan could be obtained and a consultation with radiation therapy could be entertained at that time. The patient and her family are aware that it's not likely that significant benefit will be obtained from radiation therapy but a consult may provide helpful information. I've asked the patient to followup in 2 months after she's received cycle 6 of Gemzar and Taxotere  The poor prognosis was discussed with the patient and her family. Recommendation was given for immediate in the situation of chemotherapy, to Docetaxol and gemcitabine for emission symptom management. If there is a response to chemotherapy and residual disease remains in the retroperitoneal area consideration could be  given for adjuvant radiotherapy. Prescription for Percocet was given. Patient's been asked to followup in 3 weeks. She's been advised to keep all appointments with Dr. Juel Burrow as necessary.  Laurette Schimke, MD 09/13/2011, 5:09 PM                         Consult Note: Gyn-Onc   Jackie Cook 51 y.o. female  Chief Complaint  Patient presents with  . Uterine  cancer    Follow up    Interval History:   HPI:  Allergies  Allergen Reactions  . Aspirin     GI upset  . Morphine And Related Itching  . Penicillins Itching    Past Medical History  Diagnosis Date  . Anemia     Iron deficiency  . Blood transfusion     2001  . H/O hiatal hernia   . H/O menorrhagia   . DDD (degenerative disc disease)   . Malignant neoplasm of body of uterus 05/31/2011    metastatic leiomyosarcoma of uterus    Past Surgical History  Procedure Date  . Total abdominal hysterectomy w/ bilateral salpingoophorectomy 05/22/11  . Laparotomy 05/22/2011    Procedure: EXPLORATORY LAPAROTOMY;  Surgeon: Laurette Schimke, MD PHD;  Location: [facility];  Service: Gynecology;  Laterality: N/A;  . Abdominal hysterectomy 05/22/2011    Procedure: HYSTERECTOMY ABDOMINAL;  Surgeon: Laurette Schimke, MD PHD;  Location: [facility];  Service: Gynecology;  Laterality: N/A;  with Resection of Pelvic Mass    . Salpingoophorectomy 05/22/2011    Procedure: SALPINGO OOPHERECTOMY;  Surgeon: Laurette Schimke, MD PHD;  Location: [facility];  Service: Gynecology;  Laterality: Bilateral;  . Portacath placement 06/18/2011    Dr. Mervyn Skeeters. Hoss, 8 Fr, Rt. port w/ tip in SVC    Current Outpatient Prescriptions  Medication Sig Dispense Refill  . ciprofloxacin (CIPRO) 250 MG tablet Take 1 tablet (250 mg total) by mouth 2 (two) times daily.  14 tablet  0  . dexamethasone (DECADRON) 4 MG tablet Take 2 tablets (8 mg total) by mouth 2 (two) times daily with a meal. 2 tablets ( 8 mg) by mouth twice a day, the day before, the day of and the day  after chemotherapy  12 tablet  1  . docusate sodium (COLACE) 100 MG capsule Take 200 mg by mouth daily.       . ferrous fumarate (HEMOCYTE - 106 MG FE) 325 (106 FE) MG TABS Take 1 tablet (106 mg of iron total) by mouth 2 (two) times daily.  60 each  2  . LORazepam (ATIVAN) 0.5 MG tablet Take 1-2 q6h prn nausea or to relax  30 tablet  1  . magnesium hydroxide (PHILLIPS MILK OF MAGNESIA) 400 MG/5ML suspension Take 5 mLs by mouth daily as needed.      . Multiple Vitamins-Minerals (MULTIVITAMIN PO) Take 1 tablet by mouth daily.      Marland Kitchen oxyCODONE-acetaminophen (PERCOCET) 5-325 MG per tablet TAKE 1-2 TABS EVERY 4-6 HOURS AS NEEDED FOR PAIN  40 tablet  0  . pantoprazole (PROTONIX) 40 MG tablet Take 1 tablet (40 mg total) by mouth daily.  30 tablet  2  . polyethylene glycol powder (MIRALAX) powder Take 17 g by mouth daily.  255 g  1  . promethazine (PHENERGAN) 25 MG tablet Take 1 tablet (25 mg total) by mouth every 6 (six) hours as needed for nausea.  30 tablet  0  . traMADol (ULTRAM) 50 MG tablet Take 2 tablets (100 mg total) by mouth every 6 (six) hours as needed for pain.  120 tablet  0  . warfarin (COUMADIN) 5 MG tablet 1 tablet by mouth daily between 4 pm and 6 pm, or as directed  50 tablet  1  . promethazine (PHENERGAN) 25 MG tablet Take 25 mg by mouth every 6 (six) hours as needed. For nausea.        History   Social History  . Marital Status: Single  Spouse Name: N/A    Number of Children: 0  . Years of Education: 12   Occupational History  . Unemployed   . Has done dog sitting in the past.    Social History Main Topics  . Smoking status: Former Smoker -- 1.0 packs/day for 34 years    Quit date: 05/22/2011  . Smokeless tobacco: Never Used  . Alcohol Use: No  . Drug Use: No  . Sexually Active: No   Other Topics Concern  . Not on file   Social History Narrative   Single.  Lives with a friend.  Independent of ADLs and ambulation.    Family History  Problem Relation Age of  Onset  . Breast cancer Maternal Aunt   . Heart attack Other   . Lung cancer Father   .       Review of Systems:  Vitals: Blood pressure 108/60, pulse 78, temperature 99.2 F (37.3 C), temperature source Oral, resp. rate 20, height 5' 5.83" (1.672 m), weight 263 lb 4.8 oz (119.432 kg), last menstrual period 04/09/2011.  Physical Exam:  Assessment/Plan:   Laurette Schimke, MD 09/13/2011, 5:09 PM

## 2011-09-13 NOTE — Telephone Encounter (Signed)
Labs reviewed Jackie Cook, [name]. Pt instructed to continue taking Coumadin 5mg  each day until she returns on 09/18/11 for repeat labs and office visit with Dr Darrold Span.

## 2011-09-14 ENCOUNTER — Ambulatory Visit (HOSPITAL_COMMUNITY): Payer: Medicaid Other

## 2011-09-17 ENCOUNTER — Other Ambulatory Visit: Payer: Self-pay

## 2011-09-17 ENCOUNTER — Ambulatory Visit: Payer: Self-pay

## 2011-09-17 DIAGNOSIS — C55 Malignant neoplasm of uterus, part unspecified: Secondary | ICD-10-CM

## 2011-09-17 NOTE — Patient Instructions (Signed)
Take 5 mg nightly

## 2011-09-17 NOTE — Progress Notes (Signed)
Spoke with Ms. Matthew.  She finishes cipro tonight  for uti.  Will Obtain a urine culture from both nephrostomy tubes on thurs. 09-20-11 to make sure no more infection per Dr. Darrold Span.

## 2011-09-18 ENCOUNTER — Other Ambulatory Visit: Payer: Self-pay | Admitting: *Deleted

## 2011-09-18 ENCOUNTER — Ambulatory Visit (HOSPITAL_BASED_OUTPATIENT_CLINIC_OR_DEPARTMENT_OTHER): Payer: Medicaid Other | Admitting: Oncology

## 2011-09-18 ENCOUNTER — Encounter: Payer: Self-pay | Admitting: Oncology

## 2011-09-18 ENCOUNTER — Other Ambulatory Visit (HOSPITAL_COMMUNITY): Payer: Self-pay | Admitting: Interventional Radiology

## 2011-09-18 ENCOUNTER — Ambulatory Visit (HOSPITAL_COMMUNITY)
Admission: RE | Admit: 2011-09-18 | Discharge: 2011-09-18 | Disposition: A | Discharge status: Home / Self Care | Payer: Medicaid Other | Source: Ambulatory Visit | Attending: Interventional Radiology | Admitting: Interventional Radiology

## 2011-09-18 ENCOUNTER — Telehealth: Payer: Self-pay

## 2011-09-18 ENCOUNTER — Ambulatory Visit (HOSPITAL_COMMUNITY)
Admission: RE | Admit: 2011-09-18 | Discharge: 2011-09-18 | Disposition: A | Discharge status: Home / Self Care | Payer: Medicaid Other | Source: Ambulatory Visit | Attending: Gynecologic Oncology | Admitting: Gynecologic Oncology

## 2011-09-18 ENCOUNTER — Telehealth: Payer: Self-pay | Admitting: Oncology

## 2011-09-18 ENCOUNTER — Other Ambulatory Visit (HOSPITAL_BASED_OUTPATIENT_CLINIC_OR_DEPARTMENT_OTHER): Payer: Medicaid Other | Admitting: Lab

## 2011-09-18 VITALS — BP 112/61 | Temp 99.3°F | Ht 65.83 in | Wt 261.5 lb

## 2011-09-18 DIAGNOSIS — I82409 Acute embolism and thrombosis of unspecified deep veins of unspecified lower extremity: Secondary | ICD-10-CM

## 2011-09-18 DIAGNOSIS — C55 Malignant neoplasm of uterus, part unspecified: Secondary | ICD-10-CM | POA: Insufficient documentation

## 2011-09-18 DIAGNOSIS — D649 Anemia, unspecified: Secondary | ICD-10-CM

## 2011-09-18 DIAGNOSIS — M7989 Other specified soft tissue disorders: Secondary | ICD-10-CM | POA: Insufficient documentation

## 2011-09-18 DIAGNOSIS — N135 Crossing vessel and stricture of ureter without hydronephrosis: Secondary | ICD-10-CM

## 2011-09-18 DIAGNOSIS — C7800 Secondary malignant neoplasm of unspecified lung: Secondary | ICD-10-CM

## 2011-09-18 LAB — CBC WITH DIFFERENTIAL/PLATELET
BASO%: 0.3 % (ref 0.0–2.0)
Eosinophils Absolute: 0.1 10*3/uL (ref 0.0–0.5)
HCT: 27.7 % — ABNORMAL LOW (ref 34.8–46.6)
LYMPH%: 5.4 % — ABNORMAL LOW (ref 14.0–49.7)
MCHC: 32.9 g/dL (ref 31.5–36.0)
MONO#: 2.1 10*3/uL — ABNORMAL HIGH (ref 0.1–0.9)
NEUT#: 18.2 10*3/uL — ABNORMAL HIGH (ref 1.5–6.5)
NEUT%: 84 % — ABNORMAL HIGH (ref 38.4–76.8)
Platelets: 435 10*3/uL — ABNORMAL HIGH (ref 145–400)
RBC: 3.08 10*6/uL — ABNORMAL LOW (ref 3.70–5.45)
WBC: 21.7 10*3/uL — ABNORMAL HIGH (ref 3.9–10.3)
lymph#: 1.2 10*3/uL (ref 0.9–3.3)
nRBC: 0 % (ref 0–0)

## 2011-09-18 LAB — PROTIME-INR

## 2011-09-18 MED ORDER — METOCLOPRAMIDE HCL 10 MG PO TABS: ORAL_TABLET | ORAL | Status: DC

## 2011-09-18 MED ORDER — DEXAMETHASONE 4 MG PO TABS: 8.0000 mg | ORAL_TABLET | Freq: Two times a day (BID) | ORAL | Status: DC

## 2011-09-18 NOTE — Progress Notes (Signed)
VASCULAR LAB PRELIMINARY  PRELIMINARY  PRELIMINARY  PRELIMINARY  Left lower extremity venous duplex completed.    Preliminary report:  Left:  No evidence of DVT, superficial thrombosis, or Baker's cyst.  Terance Hart, RVt 09/18/2011, 11:24 AM

## 2011-09-18 NOTE — Telephone Encounter (Signed)
SPOKE WITH MS. Jackie Cook AND TOLD HER THAT DR. Darrold Span THINKS THE LE SWELLING MAY BE INCREASED ALSO FROM THE TAXOTERE ITSELF, AS THIS CAN CAUSE FLUID RETENTION PROBABLY MAKING THE CIRCULATION PROBLEMS WORSE. DR. Darrold Span WILL ONLY USE GEMZAR FOR TREATMENT ON 6-10 AND 10-01-11.  DO NOT NEED TO TAKE THE DECADRON THIS CYCLE. IR DID ADJUST R NEPHROSTOMY APPARATUS AND IT IS MUCH MORE COMFORTABLE. WILL CHECK UP ON HER TOMORROW 09-19-11 TO BE SURE R NEPHOSTOMY SITE IS IMPROVING.

## 2011-09-18 NOTE — Patient Instructions (Signed)
Go to IR today to evaluate placement of PCN appliance  Use topical antibiotic to irritated area below right PCN twice daily x 1 week  Chemotherapy 6-10 and 6-17  Prescriptions to [facility] pharmacy: decadron, reglan (metaclopramide), pain medicine  Will recheck urine culture with chemo 6-10  Use ace bandages loosely to both legs daily  Use Percocet 4x daily

## 2011-09-18 NOTE — Telephone Encounter (Signed)
Pt aware of appts,printed,aware that i will call with 6/10 and 6/17

## 2011-09-18 NOTE — Progress Notes (Signed)
OFFICE PROGRESS NOTE Date of Visit 09-18-11 Physicians: W.Brewster, Interventional Radiology, S.Dahlstedt  INTERVAL HISTORY:  Patient is seen, together with mother, in continuing attention to her metastatic leiomyosarcoma of uterus, now having had 4 cycles of gemzar/taxotere thru 09-03-11. She saw Dr Nelly Rout on 09-13-11, with CT from just prior to cycle 4 reviewed. She had LLE venous doppler today due to increased swelling there, negative for clot on left. She continues coumadin for DVT RLE. Dr Nelly Rout discussed continuing same chemotherapy x 2 more cycles vs hospice, and possible consideration of RT. She would like to continue chemotherapy now. Patient has been uncomfortable from swelling in bilateral LE, which makes ambulation difficult. She has had mid abdominal pain which improved when bowels moved well yesterday. She has nausea after eating, is drinking some fluids (coffee, water), is not having GERD symptoms now and does not think she has been on protonix at least recently. She has slept some using percocet at hs, tho generally wakens after several hours. She is using the percocet only 1-2 times/ 1 tablet each in 24 hrs, mostly because she does not want to be constipated. She is using miralax alternating with MOM and we have discussed increasing these if needed. She is having new significant discomfort at right PCN; tape irritation is improved with paper tape now. She has not had bleeding. On Review of Systems otherwise, no fever or symptoms of infection. She finishes antibioic for UTI today and reports that odor is gone from urine.She denies cough or chest pain. Remainder of 10 point Review of Systems negative.  Objective:  Vital signs in last 24 hours:  BP 112/61  Temp(Src) 99.3 F (37.4 C) (Oral)  Ht 5' 5.83" (1.672 m)  Wt 118.616 kg (261 lb 8 oz)  BMI 42.43 kg/m2  LMP 04/09/2011 Weight is down 1 kg  HEENT:mucous membranes moist, pharynx normal without lesions. Alopecia. PERRL, not  icteric LymphaticsCervical, supraclavicular, and axillary nodes normal. Resp: clear to auscultation bilaterally Cardio: regular rate and rhythm GI: obese, soft, quiet, not clearly distended, not tender. Cannot appreciate HSM or mass. Extremities: tight swelling bilateral LE, tender to gentle palpation on right, no significant erythema or streaking. Neuro:nonfocal Skin without bruises or petechiae Right PCN insertion site not remarkable. ~ 2 cm below insertion skin is abraded from plastic on the lower portion of the apparatus, slight purulent drainage there. Left PCN site not remarkable and skin improved. Portacath-without erythema or tenderness  Lab Results:   Basename 09/18/11 1143  WBC 21.7*  HGB 9.1*  HCT 27.7*  PLT 435*  ANC 18.2  INR 2.2 BMET Last CMET 09-03-11 Studies/Results:  No results found.  Medications: I have reviewed the patient's current medications. Decadron was called in, however we will hold taxotere at least upcoming cycle (see below); prescription given for percocet 3/325 which I have asked her to use at least 4x in 24 hrs until next visit. She will use miralax and/or MOM daily - bid as needed. She will begin reglan 10 mg as/hs for nausea and constipation.   Assessment/Plan: 1. 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 We will be back in touch with her by phone to recommend gemzar only day 1 and day 8 (without neulasta) for upcoming cycle 5. Cycle 5 will be 6-10 and 6-17. I will see her again at least 6-26. 2.RLE DVT: continue coumadin  5 mg daily. Recheck PT/INR with Rx 6-10, 6-17 and with my visit 6-16. 3.LE swelling as above: I have also asked her to wrap feet and legs in ACE bandages, not tight.  4.Irritated area below right PCN: IR to see today. We have given her samples of  topical antibiotic to use bid to that area, enough for a week. 5.multifactorial anemia: a little better today. Has had transfusions intermittently and IV iron in April. 6.past tobacco 7. Poor dentition 8. CAD by CT Patient and mother were comfortable with discussion and plan. Decision re taxotere above made by MD after visit and will be communicated by phonel  Jackie Cook P, MD   09/18/2011, 4:06 PM

## 2011-09-19 ENCOUNTER — Encounter: Payer: Self-pay | Admitting: Oncology

## 2011-09-19 ENCOUNTER — Telehealth: Payer: Self-pay | Admitting: Oncology

## 2011-09-19 ENCOUNTER — Other Ambulatory Visit: Payer: Self-pay | Admitting: *Deleted

## 2011-09-19 NOTE — Telephone Encounter (Signed)
l/m with 6/10 and 6/17 lab tx times   aom

## 2011-09-19 NOTE — Telephone Encounter (Signed)
Ann from [employer] [facility] called regarding Jackie Cook. They would like to have a prescription for the compression hose with the weight noted. "Compression stocking 15-20 or 20-30" will be sufficient. They might have to do a custom fit for Jackie Cook.

## 2011-09-19 NOTE — Progress Notes (Signed)
Dr.Livesay did call me today and I return her call,but she could not remember why she call me.

## 2011-09-19 NOTE — Telephone Encounter (Signed)
Called in refill for Decadron 4mg  tablets (2 tabs bid X 3 days begin day prior to each Taxotere #24 for 2 cycles

## 2011-09-20 ENCOUNTER — Encounter: Payer: Self-pay | Admitting: Oncology

## 2011-09-20 ENCOUNTER — Other Ambulatory Visit: Payer: Medicaid Other | Admitting: Lab

## 2011-09-20 NOTE — Progress Notes (Signed)
Patient received one prescription from [facility] on 09/19/11 $3.00,her remaninig balance [facility] $166.77.

## 2011-09-24 ENCOUNTER — Encounter (HOSPITAL_COMMUNITY): Payer: Self-pay | Admitting: *Deleted

## 2011-09-24 ENCOUNTER — Emergency Department (HOSPITAL_COMMUNITY): Payer: Medicaid Other

## 2011-09-24 ENCOUNTER — Emergency Department (HOSPITAL_COMMUNITY)
Admission: EM | Admit: 2011-09-24 | Discharge: 2011-09-24 | Disposition: A | Discharge status: Home / Self Care | Payer: Medicaid Other | Attending: Emergency Medicine | Admitting: Emergency Medicine

## 2011-09-24 ENCOUNTER — Other Ambulatory Visit (HOSPITAL_BASED_OUTPATIENT_CLINIC_OR_DEPARTMENT_OTHER): Payer: Medicaid Other

## 2011-09-24 ENCOUNTER — Other Ambulatory Visit: Payer: Self-pay | Admitting: Physician Assistant

## 2011-09-24 ENCOUNTER — Ambulatory Visit: Payer: Medicaid Other

## 2011-09-24 ENCOUNTER — Other Ambulatory Visit: Payer: Self-pay | Admitting: Medical Oncology

## 2011-09-24 ENCOUNTER — Telehealth: Payer: Self-pay | Admitting: Medical Oncology

## 2011-09-24 ENCOUNTER — Other Ambulatory Visit: Payer: Self-pay

## 2011-09-24 ENCOUNTER — Ambulatory Visit (HOSPITAL_BASED_OUTPATIENT_CLINIC_OR_DEPARTMENT_OTHER): Payer: Medicaid Other | Admitting: Physician Assistant

## 2011-09-24 ENCOUNTER — Encounter: Payer: Self-pay | Admitting: Physician Assistant

## 2011-09-24 DIAGNOSIS — IMO0002 Reserved for concepts with insufficient information to code with codable children: Secondary | ICD-10-CM | POA: Insufficient documentation

## 2011-09-24 DIAGNOSIS — Z8542 Personal history of malignant neoplasm of other parts of uterus: Secondary | ICD-10-CM | POA: Insufficient documentation

## 2011-09-24 DIAGNOSIS — R509 Fever, unspecified: Secondary | ICD-10-CM | POA: Insufficient documentation

## 2011-09-24 DIAGNOSIS — I82409 Acute embolism and thrombosis of unspecified deep veins of unspecified lower extremity: Secondary | ICD-10-CM

## 2011-09-24 DIAGNOSIS — R0602 Shortness of breath: Secondary | ICD-10-CM | POA: Insufficient documentation

## 2011-09-24 DIAGNOSIS — C55 Malignant neoplasm of uterus, part unspecified: Secondary | ICD-10-CM

## 2011-09-24 DIAGNOSIS — R000 Tachycardia, unspecified: Secondary | ICD-10-CM

## 2011-09-24 DIAGNOSIS — I471 Supraventricular tachycardia: Secondary | ICD-10-CM

## 2011-09-24 DIAGNOSIS — I498 Other specified cardiac arrhythmias: Secondary | ICD-10-CM

## 2011-09-24 DIAGNOSIS — Z7901 Long term (current) use of anticoagulants: Secondary | ICD-10-CM | POA: Insufficient documentation

## 2011-09-24 DIAGNOSIS — C7800 Secondary malignant neoplasm of unspecified lung: Secondary | ICD-10-CM

## 2011-09-24 DIAGNOSIS — Z79899 Other long term (current) drug therapy: Secondary | ICD-10-CM | POA: Insufficient documentation

## 2011-09-24 DIAGNOSIS — M7989 Other specified soft tissue disorders: Secondary | ICD-10-CM | POA: Insufficient documentation

## 2011-09-24 LAB — URINALYSIS, ROUTINE W REFLEX MICROSCOPIC
Bilirubin Urine: NEGATIVE
Nitrite: POSITIVE — AB
Protein, ur: 30 mg/dL — AB
Specific Gravity, Urine: >1.046 — ABNORMAL HIGH (ref 1.005–1.030)
Urobilinogen, UA: 0.2 mg/dL (ref 0.0–1.0)

## 2011-09-24 LAB — CBC
HCT: 26.1 % — ABNORMAL LOW (ref 36.0–46.0)
Hemoglobin: 8.4 g/dL — ABNORMAL LOW (ref 12.0–15.0)
MCHC: 32.2 g/dL (ref 30.0–36.0)
RDW: 18.4 % — ABNORMAL HIGH (ref 11.5–15.5)
WBC: 16.8 10*3/uL — ABNORMAL HIGH (ref 4.0–10.5)

## 2011-09-24 LAB — URINE MICROSCOPIC-ADD ON

## 2011-09-24 LAB — CBC WITH DIFFERENTIAL/PLATELET
Basophils Absolute: 0.1 10*3/uL (ref 0.0–0.1)
Eosinophils Absolute: 0.1 10*3/uL (ref 0.0–0.5)
HCT: 26.2 % — ABNORMAL LOW (ref 34.8–46.6)
HGB: 8.6 g/dL — ABNORMAL LOW (ref 11.6–15.9)
NEUT#: 13.5 10*3/uL — ABNORMAL HIGH (ref 1.5–6.5)
NEUT%: 76.2 % (ref 38.4–76.8)
RDW: 18.7 % — ABNORMAL HIGH (ref 11.2–14.5)
lymph#: 1.8 10*3/uL (ref 0.9–3.3)

## 2011-09-24 LAB — PROTIME-INR
INR: 2.7 (ref 2.00–3.50)
INR: 2.44 — ABNORMAL HIGH (ref 0.00–1.49)
Prothrombin Time: 26.9 seconds — ABNORMAL HIGH (ref 11.6–15.2)
Protime: 32.4 Seconds — ABNORMAL HIGH (ref 10.6–13.4)

## 2011-09-24 LAB — D-DIMER, QUANTITATIVE: D-Dimer, Quant: 1.09 ug/mL-FEU — ABNORMAL HIGH (ref 0.00–0.48)

## 2011-09-24 LAB — BASIC METABOLIC PANEL
Chloride: 100 mEq/L (ref 96–112)
GFR calc Af Amer: 87 mL/min — ABNORMAL LOW (ref >90)
GFR calc non Af Amer: 75 mL/min — ABNORMAL LOW (ref >90)
Potassium: 4 mEq/L (ref 3.5–5.1)

## 2011-09-24 MED ORDER — HEPARIN SOD (PORK) LOCK FLUSH 100 UNIT/ML IV SOLN
INTRAVENOUS | Status: AC
  Administered 2011-09-24: 500 [IU]
  Filled 2011-09-24: qty 5

## 2011-09-24 MED ORDER — OXYCODONE-ACETAMINOPHEN 5-325 MG PO TABS
2.0000 | ORAL_TABLET | Freq: Four times a day (QID) | ORAL | Status: DC | PRN
  Administered 2011-09-24: 2 via ORAL
  Filled 2011-09-24: qty 2

## 2011-09-24 MED ORDER — IOHEXOL 300 MG/ML  SOLN
100.0000 mL | Freq: Once | INTRAMUSCULAR | Status: AC | PRN
  Administered 2011-09-24: 100 mL via INTRAVENOUS

## 2011-09-24 NOTE — Discharge Instructions (Signed)
Tachycardia, Nonspecific  In adults, the heart normally beats between 60 and 100 times a minute. A heart rate over 100 is called tachycardia. When your heart beats too fast, it may not be able to pump enough blood to the rest of the body.  CAUSES    Exercise or exertion.   Fever.   Pain or injury.   Infection.   Loss of fluid (dehydration).   Overactive thyroid.   Lack of red blood cells (anemia).   Anxiety.   Alcohol.   Heart arrhythmia.   Caffeine.   Tobacco products.   Diet pills.   Street drugs.   Heart disease.  SYMPTOMS   Palpitations (rapid or irregular heartbeat).   Dizziness.   Tiredness (fatigue).   Shortness of breath.  DIAGNOSIS   After an exam and taking a history, your caregiver may order:   Blood tests.   Electrocardiogram (EKG).   Heart monitor.  TREATMENT   Treatment will depend on the cause and potential for harm. It may include:   Intravenous (IV) replacement of fluids or blood.   Antidote or reversal medicines.   Changes in your present medicines.   Lifestyle changes.  HOME CARE INSTRUCTIONS    Get rest.   Drink enough water and fluids to keep your urine clear or pale yellow.   Avoid:   Caffeine.   Nicotine.   Alcohol.   Stress.   Chocolate.   Stimulants.   Only take medicine as directed by your caregiver.  SEEK IMMEDIATE MEDICAL CARE IF:    You have pain in your chest, upper arms, jaw, or neck.   You become weak, dizzy, or feel faint.   You have palpitations that will not go away.   You throw up (vomit), have diarrhea, or pass blood.   You look pale and your skin is cool and wet.  MAKE SURE YOU:    Understand these instructions.   Will watch your condition.   Will get help right away if you are not doing well or get worse.  Document Released: 05/10/2004 Document Revised: 03/22/2011 Document Reviewed: 03/13/2011  [employer] Patient Information 2012 [employer].

## 2011-09-24 NOTE — ED Notes (Addendum)
Pt was being seen at cancer center and was sent over due to tachycardia. Pt denies chest pain. Reports having 2 dizzy spells in the past week. Pt has bilateral nephrostomy tubes. Pt reports "one is busted" and that she needed a new bag.

## 2011-09-24 NOTE — Progress Notes (Signed)
OFFICE PROGRESS NOTE Date of Visit 6-10-3 Physicians: W.Brewster, Interventional Radiology, S.Dahlstedt  INTERVAL HISTORY:  Patient shouldn't presents accompanied by her mother to proceed with cycle 5 of her systemic chemotherapy with gemcitabine to be given on day 1 and day 8. She reports some intermittent bleeding from her nephrostomy tubes on the dressings. This is been an intermittent problem. She reports she had low-grade temperature at home yesterday but denied any urinary or respiratory symptomatology. She denies any recent episodes of tachycardia although she does state that in the past she has periodically had some episodes of tachycardia and had to keep track of her pulse rate. She is a right lower extremity DVT and is on Coumadin therapy for this. She is left lower extremity edema that had recent Doppler of the left lower extremity and this was negative for DVT. She voiced no specific complaints today. The infusion nurse asked me to evaluate the patient as she found her to have a low-grade temp of 100.3 and initially as well as a pulse rate in the 160s.  The patient was asymptomatic at this heart rate.   On Review of Systems otherwise, no fever or symptoms of infection. She denies cough or chest pain. Remainder of 10 point Review of Systems negative.  Objective:  Vital signs in last 24 hours:  Taken in the infusion room: initially BP 116/70, P 160, T 100.3 Recheck  BP 117/71, P 151, T 99  EKG: revealed supraventricular tachycardia at 150 bpm, low voltage QRS and non-specific T-wave abnormality  LMP 04/09/2011 Weight is down 1 kg  HEENT:mucous membranes moist, pharynx normal without lesions. Alopecia. PERRL, sclera anicteric LymphaticsCervical, supraclavicular, and axillary nodes normal. Resp: clear to auscultation bilaterally Cardio: tachycardic rate and rhythm GI: obese, soft, quiet, not clearly distended, not tender. Cannot appreciate HSM or mass. Extremities: tight swelling  bilateral LE, tender to gentle palpation on right, no erythema Neuro:nonfocal Portacath-without erythema or tenderness, no evidence of infection  Lab Results:   Basename 09/24/11 1105 09/24/11 0811 WBC 16.8* 17.7* HGB 8.4* 8.6* HCT 26.1* 26.2* PLT 437* 469* ANC 18.2  INR 2.2 BMET Last CMET 09-03-11 Studies/Results:  Ct Angio Chest W/cm &/or Wo Cm  09/24/2011  *RADIOLOGY REPORT*  Clinical Data: Tachycardia and dizziness.  CT ANGIOGRAPHY CHEST  Technique:  Multidetector CT imaging of the chest using the standard protocol during bolus administration of intravenous contrast. Multiplanar reconstructed images including MIPs were obtained and reviewed to evaluate the vascular anatomy.  Contrast: OMNIPAQUE IOHEXOL 300 MG/ML  SOLN  Comparison: Chest radiograph 09/24/2011 and CT chest 08/20/2011.  Findings: No pulmonary embolus.  Mediastinal lymph nodes measure up to 10 mm in the prevascular space (previously 3 mm).  There is bihilar lymphoid tissue, also new.  No axillary adenopathy.  Heart and pulmonary arteries are mildly enlarged.  No pericardial effusion. Coronary artery calcification.  There are bilateral pulmonary nodules with a dominant mass in the left upper lobe, measuring 2.3 x 3.1 cm, increased from 2.1 x 2.5 cm on 08/20/2011.  No pleural fluid.  Airway is unremarkable.  Incidental imaging of the upper abdomen shows an ill-defined low attenuation lesion in the right hepatic lobe measuring 2.8 cm (previously 10 mm).  There may be a new ill-defined low attenuation lesion in the medial aspect of the spleen, measuring 2.1 cm.  No worrisome lytic or sclerotic lesions.  IMPRESSION:  1.  Progressive metastatic disease involving the lungs, mediastinal/hilar lymph nodes,  liver and spleen. 2.  Coronary artery calcification and  pulmonary arterial hypertension.  Original Report Authenticated By: Reyes Ivan, M.D.   Dg Chest Port 1 View  09/24/2011  *RADIOLOGY REPORT*  Clinical Data:  Tachycardia.  Metastatic leiomyosarcoma.  PORTABLE CHEST - 1 VIEW  Comparison: 08/20/2011 CT and 05/18/2011 chest x-ray.  Findings: Right central line tip mid superior vena cava level.  No gross pneumothorax.  Pulmonary vascular congestion.  Metastatic pulmonary nodules largest left suprahilar region.  Heart size within normal limits.  Bilateral acromioclavicular joint degenerative changes.  IMPRESSION: Pulmonary vascular congestion.  Pulmonary metastatic foci most notable left suprahilar region.  Original Report Authenticated By: Fuller Canada, M.D.    Medications: I have reviewed the patient's current medications.  Assessment/Plan: 1. 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 Arbutus Ped in Dr. Melvyn Neth is absence. Given the supraventricular tachycardia the chemotherapy will be held today and the patient will be sent to the emergency room for further evaluation and management. A found to be stable from a cardiology standpoint she may proceed with her chemotherapy as scheduled on 10/01/2011 but will also need to have chemotherapy rescheduled for 10/08/2011. We will leave the followup appointment with Dr. Darrold Span as it stands on 10/10/2011. 2.RLE DVT: continue coumadin 5 mg daily. Recheck PT/INR with Rx as previously scheduled  3.LE swelling as above: With negative Doppler for DVT  4..past tobacco 5.. Poor dentition 6.. CAD by CT  Patient and mother were comfortable with discussion and plan.   Tiana Loft E, PA-C   09/24/2011, 4:50 PM

## 2011-09-24 NOTE — ED Notes (Signed)
Pt reports right sided nephrostomy urine bag is leaking. rn called materials, they do not have the correct bags. rn then called urology to see if they have any replacement urine bags.

## 2011-09-24 NOTE — ED Notes (Signed)
Interventional radiology has nephrostomy urine bags. Tech going to pick one up. Pts one bag that is leaking will be replaced.

## 2011-09-24 NOTE — Patient Instructions (Signed)
[facility] Discharge Instructions for Patients Receiving Chemotherapy  Today you received the following chemotherapy agents Gemzar  To help prevent nausea and vomiting after your treatment, we encourage you to take your nausea medication  Begin taking it at 7 pm and take it as often as prescribed for the next 24 to 72 hours.   If you develop nausea and vomiting that is not controlled by your nausea medication, call the clinic. If it is after clinic hours your family physician or the after hours number for the clinic or go to the Emergency Department.   BELOW ARE SYMPTOMS THAT SHOULD BE REPORTED IMMEDIATELY:  *FEVER GREATER THAN 100.5 F  *CHILLS WITH OR WITHOUT FEVER  NAUSEA AND VOMITING THAT IS NOT CONTROLLED WITH YOUR NAUSEA MEDICATION  *UNUSUAL SHORTNESS OF BREATH  *UNUSUAL BRUISING OR BLEEDING  TENDERNESS IN MOUTH AND THROAT WITH OR WITHOUT PRESENCE OF ULCERS  *URINARY PROBLEMS  *BOWEL PROBLEMS  UNUSUAL RASH Items with * indicate a potential emergency and should be followed up as soon as possible.  One of the nurses will contact you 24 hours after your treatment. Please let the nurse know about any problems that you may have experienced. Feel free to call the clinic you have any questions or concerns. The clinic phone number is [phone].   I have been informed and understand all the instructions given to me. I know to contact the clinic, my physician, or go to the Emergency Department if any problems should occur. I do not have any questions at this time, but understand that I may call the clinic during office hours   should I have any questions or need assistance in obtaining follow up care.    __________________________________________  _____________  __________ Signature of Patient or Authorized Representative            Date                   Time    __________________________________________ Nurse's Signature    

## 2011-09-24 NOTE — Progress Notes (Signed)
OFFICE PROGRESS NOTE Date of Visit 6-10-3 Physicians: W.Brewster, Interventional Radiology, S.Dahlstedt  INTERVAL HISTORY:  Patient shouldn't presents accompanied by her mother to proceed with cycle 5 of her systemic chemotherapy with gemcitabine to be given on day 1 and day 8. She reports some intermittent bleeding from her nephrostomy tubes on the dressings. This is been an intermittent problem. She reports she had low-grade temperature at home yesterday but denied any urinary or respiratory symptomatology. She denies any recent episodes of tachycardia although she does state that in the past she has periodically had some episodes of tachycardia and had to keep track of her pulse rate. She is a right lower extremity DVT and is on Coumadin therapy for this. She is left lower extremity edema that had recent Doppler of the left lower extremity and this was negative for DVT. She voiced no specific complaints today. The infusion nurse asked me to evaluate the patient as she found her to have a low-grade temp of 100.3 and initially as well as a pulse rate in the 160s.  The patient was asymptomatic at this heart rate.   On Review of Systems otherwise, no fever or symptoms of infection. She denies cough or chest pain. Remainder of 10 point Review of Systems negative.  Objective:  Vital signs in last 24 hours:  Taken in the infusion room: initially BP 116/70, P 160, T 100.3 Recheck  BP 117/71, P 151, T 99  EKG: revealed supraventricular tachycardia at 150 bpm, low voltage QRS and non-specific T-wave abnormality  LMP 04/09/2011 Weight is down 1 kg  HEENT:mucous membranes moist, pharynx normal without lesions. Alopecia. PERRL, sclera anicteric LymphaticsCervical, supraclavicular, and axillary nodes normal. Resp: clear to auscultation bilaterally Cardio: tachycardic rate and rhythm GI: obese, soft, quiet, not clearly distended, not tender. Cannot appreciate HSM or mass. Extremities: tight swelling  bilateral LE, tender to gentle palpation on right, no erythema Neuro:nonfocal Portacath-without erythema or tenderness, no evidence of infection  Lab Results:   Basename 09/24/11 1105 09/24/11 0811 WBC 16.8* 17.7* HGB 8.4* 8.6* HCT 26.1* 26.2* PLT 437* 469* ANC 18.2  INR 2.2 BMET Last CMET 09-03-11 Studies/Results:  Ct Angio Chest W/cm &/or Wo Cm  09/24/2011  *RADIOLOGY REPORT*  Clinical Data: Tachycardia and dizziness.  CT ANGIOGRAPHY CHEST  Technique:  Multidetector CT imaging of the chest using the standard protocol during bolus administration of intravenous contrast. Multiplanar reconstructed images including MIPs were obtained and reviewed to evaluate the vascular anatomy.  Contrast: 100mL OMNIPAQUE IOHEXOL 300 MG/ML  SOLN  Comparison: Chest radiograph 09/24/2011 and CT chest 08/20/2011.  Findings: No pulmonary embolus.  Mediastinal lymph nodes measure up to 10 mm in the prevascular space (previously 3 mm).  There is bihilar lymphoid tissue, also new.  No axillary adenopathy.  Heart and pulmonary arteries are mildly enlarged.  No pericardial effusion. Coronary artery calcification.  There are bilateral pulmonary nodules with a dominant mass in the left upper lobe, measuring 2.3 x 3.1 cm, increased from 2.1 x 2.5 cm on 08/20/2011.  No pleural fluid.  Airway is unremarkable.  Incidental imaging of the upper abdomen shows an ill-defined low attenuation lesion in the right hepatic lobe measuring 2.8 cm (previously 10 mm).  There may be a new ill-defined low attenuation lesion in the medial aspect of the spleen, measuring 2.1 cm.  No worrisome lytic or sclerotic lesions.  IMPRESSION:  1.  Progressive metastatic disease involving the lungs, mediastinal/hilar lymph nodes,  liver and spleen. 2.  Coronary artery calcification and   pulmonary arterial hypertension.  Original Report Authenticated By: MELINDA A. BLIETZ, M.D.   Dg Chest Port 1 View  09/24/2011  *RADIOLOGY REPORT*  Clinical Data:  Tachycardia.  Metastatic leiomyosarcoma.  PORTABLE CHEST - 1 VIEW  Comparison: 08/20/2011 CT and 05/18/2011 chest x-ray.  Findings: Right central line tip mid superior vena cava level.  No gross pneumothorax.  Pulmonary vascular congestion.  Metastatic pulmonary nodules largest left suprahilar region.  Heart size within normal limits.  Bilateral acromioclavicular joint degenerative changes.  IMPRESSION: Pulmonary vascular congestion.  Pulmonary metastatic foci most notable left suprahilar region.  Original Report Authenticated By: STEVEN R. OLSON, M.D.    Medications: I have reviewed the patient's current medications.  Assessment/Plan: 1. 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 Mohamed in Dr. Lewis is absence. Given the supraventricular tachycardia the chemotherapy will be held today and the patient will be sent to the emergency room for further evaluation and management. A found to be stable from a cardiology standpoint she may proceed with her chemotherapy as scheduled on 10/01/2011 but will also need to have chemotherapy rescheduled for 10/08/2011. We will leave the followup appointment with Dr. Livesay as it stands on 10/10/2011. 2.RLE DVT: continue coumadin 5 mg daily. Recheck PT/INR with Rx as previously scheduled  3.LE swelling as above: With negative Doppler for DVT  4..past tobacco 5.. Poor dentition 6.. CAD by CT  Patient and mother were comfortable with discussion and plan.   Serah Nicoletti E, PA-C   09/24/2011, 4:50 PM      

## 2011-09-24 NOTE — ED Provider Notes (Signed)
History     CSN: [id number]  Arrival date & time 09/24/11  4098   First MD Initiated Contact with Patient 09/24/11 [phone]      Chief Complaint  Patient presents with  . Tachycardia    (Consider location/radiation/quality/duration/timing/severity/associated sxs/prior treatment) HPI Patient presents to the emergency room with a chief complaint of tachycardia. Patient has uterine cancer. She is undergoing chemotherapy and had her last treatment on May 20. Patient was at the [facility] this morning for a routine appointment and plans for another dose of chemotherapy.  While getting her vital signs they noted that the patient was tachycardic. She was sent to the emergency room for further evaluation. Patient states she otherwise has not really had any symptoms.  She was not aware that. She was tachycardic. She was not having any trouble with any chest pain or shortness of breath however she has mentioned she has noted some shortness of breath on exertion recently. She also has noted that she has had episodes of tachycardia in the past and mentioned this to her cancer Dr. She was told to keep a record of it and she is noted that she has had brief episodes of heart rates in the 150s that have subsequently resolved after a minute or so. Patient has noted in the past that has occurred when she was smoking a cigarette.  Past Medical History  Diagnosis Date  . Anemia     Iron deficiency  . Blood transfusion     2001  . H/O hiatal hernia   . H/O menorrhagia   . DDD (degenerative disc disease)   . 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 Total abdominal hysterectomy w/ bilateral salpingoophorectomy 05/22/11  . Laparotomy 05/22/2011    Procedure: EXPLORATORY LAPAROTOMY;  Surgeon: Laurette Schimke, MD PHD;  Location: [facility];  Service: Gynecology;   Laterality: N/A;  . Abdominal hysterectomy 05/22/2011    Procedure: HYSTERECTOMY ABDOMINAL;  Surgeon: Laurette Schimke, MD PHD;  Location: [facility];  Service: Gynecology;  Laterality: N/A;  with Resection of Pelvic Mass    . Salpingoophorectomy 05/22/2011    Procedure: SALPINGO OOPHERECTOMY;  Surgeon: Laurette Schimke, MD PHD;  Location: [facility];  Service: Gynecology;  Laterality: Bilateral;  . Portacath placement 06/18/2011    Dr. Mervyn Skeeters. Hoss, 8 Fr, Rt. port w/ tip in SVC    Family History  Problem Relation Age of Onset  . Breast cancer Maternal Aunt   . Heart attack Other   . Lung cancer Father   .       History  Substance Use Topics  . Smoking status: Former Smoker -- 1.0 packs/day for 34 years    Quit date: 05/22/2011  . Smokeless tobacco: Never Used  . Alcohol Use: No    OB History    Grav Para Term Preterm Abortions TAB SAB Ect Mult Living                  Review of Systems  Constitutional: Positive for fever.  Respiratory: Positive for shortness of breath (with walking distances). Negative for cough.   Cardiovascular: Negative for chest pain.  Genitourinary: Negative for dysuria.  Musculoskeletal: Positive for joint swelling (swelling right leg, h/o dvt).  All other systems reviewed and are negative.    Allergies  Aspirin; Morphine and related; and Penicillins  Home Medications  Current Outpatient Rx  Name Route Sig Dispense Refill  . DEXAMETHASONE 4 MG PO TABS Oral Take 2 tablets (8 mg total) by mouth 2 (two) times daily with a meal. 2 tablets ( 8 mg) by mouth twice a day, the day before, the day of and the day after chemotherapy 24 tablet 1  . DOCUSATE SODIUM 100 MG PO CAPS Oral Take 200 mg by mouth daily.     Marland Kitchen FERROUS FUMARATE 325 (106 FE) MG PO TABS Oral Take 1 tablet (106 mg of iron total) by mouth 2 (two) times daily. 60 each 2  . LORAZEPAM 0.5 MG PO TABS  Take 1-2 q6h prn nausea or to relax 30 tablet 1  . MAGNESIUM HYDROXIDE 400 MG/5ML PO SUSP Oral Take 5 mLs by  mouth daily as needed.    Marland Kitchen METOCLOPRAMIDE HCL 10 MG PO TABS  Take 1 tablet before meals and at bedtime = 4 per day to help stomach empty and bowels move 60 tablet 1  . MULTIVITAMIN PO Oral Take 1 tablet by mouth daily.    . OXYCODONE-ACETAMINOPHEN 5-325 MG PO TABS  TAKE 1-2 TABS EVERY 4-6 HOURS AS NEEDED FOR PAIN 40 tablet 0    PRESCRIPTION GIVEN TO PATIENT.  Marland Kitchen PANTOPRAZOLE SODIUM 40 MG PO TBEC Oral Take 1 tablet (40 mg total) by mouth daily. 30 tablet 2  . POLYETHYLENE GLYCOL 3350 PO POWD Oral Take 17 g by mouth daily. 255 g 1    TO KEEP BOWELS MOVING DAILY  . PROMETHAZINE HCL 25 MG PO TABS Oral Take 25 mg by mouth every 6 (six) hours as needed. For nausea.    Marland Kitchen PROMETHAZINE HCL 25 MG PO TABS Oral Take 1 tablet (25 mg total) by mouth every 6 (six) hours as needed for nausea. 30 tablet 0  . TRAMADOL HCL 50 MG PO TABS Oral Take 2 tablets (100 mg total) by mouth every 6 (six) hours as needed for pain. 120 tablet 0    PRESCRIPTION GIVEN TO PATIENT  . WARFARIN SODIUM 5 MG PO TABS  1 tablet by mouth daily between 4 pm and 6 pm, or as directed 50 tablet 1    BP 133/55  Pulse 96  Temp(Src) 99.2 F (37.3 C) (Oral)  Resp 18  SpO2 97%  LMP 04/09/2011  Physical Exam  Nursing note and vitals reviewed. Constitutional: She appears well-developed and well-nourished. No distress.  HENT:  Head: Normocephalic and atraumatic.  Right Ear: External ear normal.  Left Ear: External ear normal.  Eyes: Conjunctivae are normal. Right eye exhibits no discharge. Left eye exhibits no discharge. No scleral icterus.  Neck: Neck supple. No tracheal deviation present.  Cardiovascular: Normal rate, regular rhythm and intact distal pulses.   Pulmonary/Chest: Effort normal and breath sounds normal. No stridor. No respiratory distress. She has no wheezes. She has no rales.  Abdominal: Soft. Bowel sounds are normal. She exhibits no distension. There is no tenderness. There is no rebound and no guarding.        Bilateral urostomy tubes  Musculoskeletal: She exhibits tenderness. She exhibits no edema.       Edema bilateral lower extremities, tenderness palpation right calf, no erythema, normal distal pulses  Neurological: She is alert. She has normal strength. No sensory deficit. Cranial nerve deficit:  no gross defecits noted. She exhibits normal muscle tone. She displays no seizure activity. Coordination normal.  Skin: Skin is warm and dry. No rash noted.  Psychiatric: She has a normal mood and  affect.    ED Course  Procedures (including critical care time)  Rate: 94  Rhythm: normal sinus rhythm  QRS Axis: normal  Intervals: normal  ST/T Wave abnormalities: normal  Conduction Disutrbances:none  Narrative Interpretation:   Old EKG Reviewed: none available The patient's EKG from her outpatient visit was reviewed. She had a narrow complex tachycardia. Questionable sinus, versus SVT, versus a flutter with 2 to 1 block    Labs Reviewed  BASIC METABOLIC PANEL - Abnormal; Notable for the following:    Glucose, Bld 107 (*)    GFR calc non Af Amer 75 (*)    GFR calc Af Amer 87 (*)    All other components within normal limits  CBC - Abnormal; Notable for the following:    WBC 16.8 (*)    RBC 2.89 (*)    Hemoglobin 8.4 (*)    HCT 26.1 (*)    RDW 18.4 (*)    Platelets 437 (*)    All other components within normal limits  D-DIMER, QUANTITATIVE - Abnormal; Notable for the following:    D-Dimer, Quant 1.09 (*)    All other components within normal limits  PROTIME-INR - Abnormal; Notable for the following:    Prothrombin Time 26.9 (*)    INR 2.44 (*)    All other components within normal limits  URINALYSIS, ROUTINE W REFLEX MICROSCOPIC  URINE CULTURE   Ct Angio Chest W/cm &/or Wo Cm  09/24/2011  *RADIOLOGY REPORT*  Clinical Data: Tachycardia and dizziness.  CT ANGIOGRAPHY CHEST  Technique:  Multidetector CT imaging of the chest using the standard protocol during bolus administration of  intravenous contrast. Multiplanar reconstructed images including MIPs were obtained and reviewed to evaluate the vascular anatomy.  Contrast: OMNIPAQUE IOHEXOL 300 MG/ML  SOLN  Comparison: Chest radiograph 09/24/2011 and CT chest 08/20/2011.  Findings: No pulmonary embolus.  Mediastinal lymph nodes measure up to 10 mm in the prevascular space (previously 3 mm).  There is bihilar lymphoid tissue, also new.  No axillary adenopathy.  Heart and pulmonary arteries are mildly enlarged.  No pericardial effusion. Coronary artery calcification.  There are bilateral pulmonary nodules with a dominant mass in the left upper lobe, measuring 2.3 x 3.1 cm, increased from 2.1 x 2.5 cm on 08/20/2011.  No pleural fluid.  Airway is unremarkable.  Incidental imaging of the upper abdomen shows an ill-defined low attenuation lesion in the right hepatic lobe measuring 2.8 cm (previously 10 mm).  There may be a new ill-defined low attenuation lesion in the medial aspect of the spleen, measuring 2.1 cm.  No worrisome lytic or sclerotic lesions.  IMPRESSION:  1.  Progressive metastatic disease involving the lungs, mediastinal/hilar lymph nodes,  liver and spleen. 2.  Coronary artery calcification and pulmonary arterial hypertension.  Original Report Authenticated By: Reyes Ivan, M.D.   Dg Chest Port 1 View  09/24/2011  *RADIOLOGY REPORT*  Clinical Data: Tachycardia.  Metastatic leiomyosarcoma.  PORTABLE CHEST - 1 VIEW  Comparison: 08/20/2011 CT and 05/18/2011 chest x-ray.  Findings: Right central line tip mid superior vena cava level.  No gross pneumothorax.  Pulmonary vascular congestion.  Metastatic pulmonary nodules largest left suprahilar region.  Heart size within normal limits.  Bilateral acromioclavicular joint degenerative changes.  IMPRESSION: Pulmonary vascular congestion.  Pulmonary metastatic foci most notable left suprahilar region.  Original Report Authenticated By: Fuller Canada, M.D.     1. Tachycardia         MDM  The patient's CT  scan shows progressive metastatic disease but there is no evidence of pneumonia or pulmonary embolism. The patient may have had an episode of SVT or atrial flutter with a 2 to one block but that has subsequently resolved. She has remained stable in the ED without further evidence of tachycardia during several hours of observation.  At this time there does not appear to be evidence of an acute emergency medical condition. She does remain stable.   The patient did request a UA which was ordered by her doctor. She would like to go home and not wait for the result.  She has indwelling urostomy tubes associated with complications from her CA.        Celene Kras, MD 09/25/11 [phone]

## 2011-09-24 NOTE — Progress Notes (Signed)
[name] pulse rate was 160 and her temperature was 100.3. I advised Jackie Cook and Jackie Cook. These vitals were re-checked several times over the course of an hour and her pulse rate still remained elevated (150 bpm). Jackie Cook assessed the patient and ordered an EKG. The order was executed and showed the patient had superventricular tachycardia. Jackie Cook discussed the results of the EKG, Vitals and her patient assessment with Dr. Arbutus Ped. Dr. Arbutus Ped and [name] decided to send the patient to the emergency room to investigate the cause of the superventricular tachycardia.

## 2011-09-24 NOTE — ED Notes (Signed)
Pt alert and oriented x4. Respirations even and unlabored, bilateral symmetrical rise and fall of chest. Skin warm and dry. In no acute distress. Denies needs.  md at bedside 

## 2011-09-24 NOTE — ED Notes (Signed)
RN faith-marie confirmed will get urine sample once new nephrostomy bag is in place.

## 2011-09-24 NOTE — ED Notes (Signed)
Right urine bag changed. Will collect fresh sample of urine from new bag.

## 2011-09-24 NOTE — Telephone Encounter (Signed)
Jackie Cook in to see pt and wants her to be evaluated in ED. Called ED triage-[facility] to alert them that pt coming to ED with dx of asymptomatic tachycardia. I was instructed to take pt to Room 9 in the ED. Pt transported in wheelchair to ED accompanied by 2 NT.

## 2011-09-25 ENCOUNTER — Other Ambulatory Visit: Payer: Self-pay | Admitting: Oncology

## 2011-09-25 ENCOUNTER — Telehealth: Payer: Self-pay | Admitting: *Deleted

## 2011-09-25 ENCOUNTER — Telehealth: Payer: Self-pay | Admitting: Oncology

## 2011-09-25 ENCOUNTER — Telehealth (HOSPITAL_COMMUNITY): Payer: Self-pay | Admitting: Emergency Medicine

## 2011-09-25 DIAGNOSIS — C55 Malignant neoplasm of uterus, part unspecified: Secondary | ICD-10-CM

## 2011-09-25 LAB — URINE CULTURE

## 2011-09-25 MED ORDER — NITROFURANTOIN MONOHYD MACRO 100 MG PO CAPS: 100.0000 mg | ORAL_CAPSULE | Freq: Two times a day (BID) | ORAL | Status: AC

## 2011-09-25 NOTE — ED Notes (Signed)
Reviewed urinalysis from yesterday.  Will rx macrobid.  Creatinine was normal.  Urine culture previously sent.   Will have flow manager notify patient.  Celene Kras, MD 09/25/11 [phone]

## 2011-09-25 NOTE — ED Notes (Addendum)
Patient informed of positive results per request of Dr Lynelle Doctor. Patient informed that rx was called to [facility].Patient voiced understanding.

## 2011-09-25 NOTE — Telephone Encounter (Signed)
MD spoke with patient by phone and assured her that we would be calling her back as soon as possible today to follow up her phone calls from this am, but that I need to look at everything to give her best advice. Patient understood and will speak with [place] when we call again shortly.

## 2011-09-25 NOTE — Telephone Encounter (Signed)
Information from ED evaluation 09-24-11 reviewed, including MD notes, labs, urine results thus far and CT angio chest. MD spoke by phone with patient now. She has been afebrile today and has not had symptomatic tachycardia today; was 100.3 with HR 150 - 160 yesterday. ED physician has begun macrobid, which seems reasonable with the fever tho urine culture (from left nephrostomy only) does not have specific infection. She has not begun macrobid yet.  I have told patient that the CT angio chest shows progression in pulmonary mets and mediastinal adenopathy as compared with scan of 08-20-11. Continuing present chemotherapy/ single agent gemzar will not be beneficial. I have offered PRBCs this week as hgb was 8.6 and she agrees with this. I am glad to work her in to see me when she is at [facility] for transfusion, to discuss scan further (note also progression in liver and spleen, which I did not discuss now) and we can consider tamoxifen in palliative attempt. Orders sent to scheduler, transfusion orders entered, scheduler notified by email that patient expects to hear back today re above. Depending on BP, could consider low dose b-blocker; not begun yet. Patient appreciated call. Ila Mcgill, MD

## 2011-09-25 NOTE — Telephone Encounter (Signed)
Pt left voice mail stating that she was unable to get chemo yesterday (09/24/11) due to increased heart rate. Went to the ED, wants to make sure we know she gave urine specimen and had CT and Xray. Would like to know when we can reschedule her for chemo.

## 2011-09-26 ENCOUNTER — Encounter (HOSPITAL_COMMUNITY)
Admission: RE | Admit: 2011-09-26 | Discharge: 2011-09-26 | Disposition: A | Discharge status: Home / Self Care | Payer: Medicaid Other | Source: Ambulatory Visit | Attending: Oncology | Admitting: Oncology

## 2011-09-26 ENCOUNTER — Telehealth: Payer: Self-pay | Admitting: *Deleted

## 2011-09-26 ENCOUNTER — Encounter: Payer: Self-pay | Admitting: Dietician

## 2011-09-26 ENCOUNTER — Telehealth: Payer: Self-pay | Admitting: Oncology

## 2011-09-26 DIAGNOSIS — C55 Malignant neoplasm of uterus, part unspecified: Secondary | ICD-10-CM

## 2011-09-26 LAB — URINE CULTURE

## 2011-09-26 NOTE — Progress Notes (Signed)
Out-patient Oncology Nutrition Follow-up Note  Reason: Positive Nutrition Risk Screen for unintentional weight loss and decreased appetite.   Ms. Jackie Cook is a 51 year old female patient of Dr. Darrold Span, diagnosed with Uterine Cancer. She was seen by Jackie Cook, RD on August 13, 2011 for positive nutrition risk. I contacted patient via home telephone number for positive nutrition risk. She reported appetite has been ok. She reported she makes herself eat at meal times. She denies any problems with nausea at this time. Patient drinks 1 to 2 Ensure nutrition supplements daily.   Wt Readings from Last 10 Encounters:  09/18/11 261 lb 8 oz (118.616 kg)  09/13/11 263 lb 4.8 oz (119.432 kg)  09/11/11 258 lb 6.4 oz (117.209 kg)  08/24/11 249 lb 4.8 oz (113.082 kg)  08/17/11 245 lb 4.8 oz (111.267 kg)  07/30/11 231 lb 12.8 oz (105.144 kg)  07/11/11 223 lb 9.6 oz (101.424 kg)  07/02/11 226 lb 12.8 oz (102.876 kg)  06/22/11 246 lb 11.2 oz (111.902 kg)  06/18/11 238 lb 8.6 oz (108.2 kg)   Patient does not have any questions about nutrition. I have educated the patient on how to nutritionally manage symptoms of nausea. I encouraged her to continue to eat frequent meal and consume Ensure 1 or 2 daily. Patient has RD contact information.   RD available for nutrition needs.   Jackie Cook, [facility] [phone]

## 2011-09-26 NOTE — Telephone Encounter (Signed)
Talked to patient gave her appt for Friday 09/28/11 lab, MD and  PRBC

## 2011-09-26 NOTE — Telephone Encounter (Signed)
Spoke with patient about using loose ace wraps as suggested to her by Dr Darrold Span at last appointment. I have put in a call for financial assistance with cost of hose. Pt states that her back continues to hurt. Is taking percocet and continues to take antibiotics. Will follow up with Dr Darrold Span at appointment on Friday.

## 2011-09-27 ENCOUNTER — Telehealth: Payer: Self-pay | Admitting: *Deleted

## 2011-09-27 ENCOUNTER — Encounter: Payer: Self-pay | Admitting: Oncology

## 2011-09-27 NOTE — Telephone Encounter (Signed)
ACE WRAPS TO BE USED TO WRAP PT.'S LEGS AS INSTRUCTED BY DR.LIVESAY.

## 2011-09-27 NOTE — Telephone Encounter (Signed)
Husband got his supplies .

## 2011-09-27 NOTE — Telephone Encounter (Signed)
Received call from [facility] stating that pt's husband was very ugly & upset that she called the office for advice on what supplies to give him.  She understood that they were to wrap the left leg only & he cursed & said he would wrap both & didn't care what anybody said.  She stated that they were all very nice to him & just wanted to let us know this.

## 2011-09-27 NOTE — Progress Notes (Signed)
Patient received one prescription from [facility] on 09/26/11 $3.00,her remaninig balance [facility] $163.77.

## 2011-09-28 ENCOUNTER — Encounter: Payer: Self-pay | Admitting: Oncology

## 2011-09-28 ENCOUNTER — Other Ambulatory Visit: Payer: Self-pay

## 2011-09-28 ENCOUNTER — Ambulatory Visit (HOSPITAL_BASED_OUTPATIENT_CLINIC_OR_DEPARTMENT_OTHER): Payer: Medicaid Other

## 2011-09-28 ENCOUNTER — Other Ambulatory Visit (HOSPITAL_BASED_OUTPATIENT_CLINIC_OR_DEPARTMENT_OTHER): Payer: Medicaid Other | Admitting: Lab

## 2011-09-28 ENCOUNTER — Telehealth: Payer: Self-pay

## 2011-09-28 ENCOUNTER — Ambulatory Visit (HOSPITAL_BASED_OUTPATIENT_CLINIC_OR_DEPARTMENT_OTHER): Payer: Medicaid Other | Admitting: Oncology

## 2011-09-28 ENCOUNTER — Telehealth: Payer: Self-pay | Admitting: Oncology

## 2011-09-28 VITALS — BP 137/64 | HR 86 | Temp 98.8°F | Resp 16

## 2011-09-28 VITALS — BP 124/67 | HR 90 | Temp 98.5°F | Ht 66.0 in | Wt 265.4 lb

## 2011-09-28 DIAGNOSIS — C7800 Secondary malignant neoplasm of unspecified lung: Secondary | ICD-10-CM

## 2011-09-28 DIAGNOSIS — C55 Malignant neoplasm of uterus, part unspecified: Secondary | ICD-10-CM

## 2011-09-28 DIAGNOSIS — Z5181 Encounter for therapeutic drug level monitoring: Secondary | ICD-10-CM

## 2011-09-28 DIAGNOSIS — I82409 Acute embolism and thrombosis of unspecified deep veins of unspecified lower extremity: Secondary | ICD-10-CM

## 2011-09-28 DIAGNOSIS — Z7901 Long term (current) use of anticoagulants: Secondary | ICD-10-CM

## 2011-09-28 DIAGNOSIS — D649 Anemia, unspecified: Secondary | ICD-10-CM

## 2011-09-28 LAB — CBC WITH DIFFERENTIAL/PLATELET
BASO%: 0.7 % (ref 0.0–2.0)
EOS%: 1.2 % (ref 0.0–7.0)
Eosinophils Absolute: 0.2 10*3/uL (ref 0.0–0.5)
LYMPH%: 10.2 % — ABNORMAL LOW (ref 14.0–49.7)
MCH: 29 pg (ref 25.1–34.0)
MCHC: 32.1 g/dL (ref 31.5–36.0)
MCV: 90.4 fL (ref 79.5–101.0)
MONO%: 10.9 % (ref 0.0–14.0)
NEUT#: 12 10*3/uL — ABNORMAL HIGH (ref 1.5–6.5)
Platelets: 421 10*3/uL — ABNORMAL HIGH (ref 145–400)
RBC: 2.96 10*6/uL — ABNORMAL LOW (ref 3.70–5.45)
RDW: 19.1 % — ABNORMAL HIGH (ref 11.2–14.5)

## 2011-09-28 LAB — PROTIME-INR
INR: 3.1 (ref 2.00–3.50)
Protime: 37.2 Seconds — ABNORMAL HIGH (ref 10.6–13.4)

## 2011-09-28 LAB — URINE CULTURE: Colony Count: >=100000

## 2011-09-28 MED ORDER — DIPHENHYDRAMINE HCL 25 MG PO CAPS
25.0000 mg | ORAL_CAPSULE | Freq: Once | ORAL | Status: AC
  Administered 2011-09-28: 25 mg via ORAL

## 2011-09-28 MED ORDER — ACETAMINOPHEN 325 MG PO TABS
650.0000 mg | ORAL_TABLET | Freq: Once | ORAL | Status: AC
  Administered 2011-09-28: 650 mg via ORAL

## 2011-09-28 MED ORDER — HYDROMORPHONE HCL 2 MG PO TABS: ORAL_TABLET | ORAL | Status: DC

## 2011-09-28 MED ORDER — HEPARIN SOD (PORK) LOCK FLUSH 100 UNIT/ML IV SOLN
500.0000 [IU] | Freq: Every day | INTRAVENOUS | Status: AC | PRN
  Administered 2011-09-28: 500 [IU]
  Filled 2011-09-28: qty 5

## 2011-09-28 MED ORDER — HEPARIN SOD (PORK) LOCK FLUSH 100 UNIT/ML IV SOLN
250.0000 [IU] | INTRAVENOUS | Status: DC | PRN
  Filled 2011-09-28: qty 5

## 2011-09-28 MED ORDER — OXYCODONE-ACETAMINOPHEN 5-325 MG PO TABS: ORAL_TABLET | ORAL | Status: DC

## 2011-09-28 MED ORDER — SODIUM CHLORIDE 0.9 % IJ SOLN
3.0000 mL | INTRAMUSCULAR | Status: AC | PRN
  Administered 2011-09-28: 3 mL
  Filled 2011-09-28: qty 10

## 2011-09-28 MED ORDER — SODIUM CHLORIDE 0.9 % IV SOLN
250.0000 mL | Freq: Once | INTRAVENOUS | Status: AC
  Administered 2011-09-28: 100 mL via INTRAVENOUS

## 2011-09-28 MED ORDER — TAMOXIFEN CITRATE 20 MG PO TABS: 20.0000 mg | ORAL_TABLET | Freq: Every day | ORAL | Status: DC

## 2011-09-28 MED ORDER — HYDROMORPHONE HCL PF 4 MG/ML IJ SOLN
1.0000 mg | Freq: Once | INTRAMUSCULAR | Status: AC
  Administered 2011-09-28: 1 mg via INTRAVENOUS

## 2011-09-28 MED ORDER — ONDANSETRON 8 MG/50ML IVPB (CHCC)
8.0000 mg | Freq: Once | INTRAVENOUS | Status: AC
  Administered 2011-09-28: 8 mg via INTRAVENOUS

## 2011-09-28 NOTE — Patient Instructions (Addendum)
Blood Transfusion  A blood transfusion replaces your blood or some of its parts. Blood is replaced when you have lost blood because of surgery, an accident, or for severe blood conditions like anemia. You can donate blood to be used on yourself if you have a planned surgery. If you lose blood during that surgery, your own blood can be given back to you. Any blood given to you is checked to make sure it matches your blood type. Your temperature, blood pressure, and heart rate (vital signs) will be checked often.  GET HELP RIGHT AWAY IF:   You feel sick to your stomach (nauseous) or throw up (vomit).   You have watery poop (diarrhea).   You have shortness of breath or trouble breathing.   You have blood in your pee (urine) or have dark colored pee.   You have chest pain or tightness.   Your eyes or skin turn yellow (jaundice).   You have a temperature by mouth above 102 F (38.9 C), not controlled by medicine.   You start to shake and have chills.   You develop a a red rash (hives) or feel itchy.   You develop lightheadedness or feel confused.   You develop back, joint, or muscle pain.   You do not feel hungry (lost appetite).   You feel tired, restless, or nervous.   You develop belly (abdominal) cramps.  Document Released: 06/29/2008 Document Revised: 03/22/2011 Document Reviewed: 06/29/2008 [facility] Patient Information 2012 [place], [place].  CALL [facility] [phone] for further questions or call 911 and go to the nearest emergency department with any immediate attention.

## 2011-09-28 NOTE — Telephone Encounter (Signed)
Gv pt appt for june 2013.  schedule ct scan for 06/20 @ [facility]

## 2011-09-28 NOTE — Progress Notes (Signed)
OFFICE PROGRESS NOTE Date of Visit 09-28-11  Physicians: W.Brewster, IR, S.Dahlstedt INTERVAL HISTORY:  Patient is seen, together with mother, in continuing attention to her metastatic leiomyosarcoma of uterus involving lung, with progression at least in chest by CT angio chest done 09-24-11, despite gemzar/taxotere.  History is of heavy menstrual bleeding x years, then 4 months of continuous light vaginal bleeding fall 2012, and constipation. She had also lost 50 lbs in 8 months, which was reportedly intentional. She presented to ED with pain, with CT AP 05-07-2011 in [facility] system with 20 x 16.7 x 16 cm pelvic mass thought to be uterus, with second 10.3x10.7x8.8 cm mass adjacent, an 11 x 7.5 cm nodal mass encasing right iliac vessels and severe right hydronephrosis. She was seen by [facility] 05-11-11, then went to exploratory laparotomy with supracervical hysterectomy at [facility] by Dr.Wendy Nelly Rout on 05-22-11. At surgery she had a 24 cm uterus extending to bilateral pelvic sidewalls with an ~ 15 cm retroperitoneal mass. Pathology [phone]) showed multifocal leiomyosarcoma spanning 17 cm, involving uterus and right adnexa, + LVSI. Postoperatively she was tachycardic which improved with 2 units PRBCs; she also had CT angio chest done during that hospitalization 05-24-11 which was negative for PE but did show multiple bilateral pulmonary nodules radiographically consistent with metastases. She was seen in follow up by Dr.Brewster at clinic on 05-31-2011, with increased swelling of right thigh and right back pain; venous doppler was negative for clot RLE. She had bilateral ureteral obstruction by 06-12-2011, with creatinine up to 3.76, with bilateral PCNs placed by IR. She had PAC placed by IR. She began gemzar/taxotere 06-25-11, day 1 day 8 q 21 days with neulasta support. She has required PRBC transfusions on several occasions thru this course and had IV iron on 08-06-11. She had new RLE DVT documented  08-17-11. Restaging CT 08-20-11 had fairly stable disease in chest and still significant involvement in pelvis.  Cycle 5 was to be gemzar only due to LE swelling possibly exacerbated by taxotere, however day 1 was held on 09-24-11 due to asymptomatic heart rate 150 - 160. She was evaluated in ED with sinus tachycardia vs SVT vs a flutter with 2:1 block. CT angio chest in ED was negative for PE but did show progression of pulmonary mets. She also had >100K citrobacter freundii in urine culture, begun on 14 days of nitrofurantoin ~ 09-25-11 (indeterminate sensitivity to nitrofurantoin, with other options for oral antibiotics if needed). Tachycardia resolved in ED and has not been symptomatic since; I have asked her to check pulse 1-2x daily as she may need trial of low dose B blocker. I spoke with patient by phone re CT results, and she is aware that further gemzar/taxotere would not be useful and that this combination is usually most effective chemotherapy option. I have mentioned other chemotherapy with more significant side effects (ifosfamide, adria) which she does not want. We did set up PRBCs to be given today, with hgb 8.6.  Patient has progressive pain in area of sacrum and mid/lower abdomen not improved with percocet 2 every 4 hrs and not positional or related to bowel movements. She has increased miralax and MOM to bid, did have bowel movement today. She continues coumadin, has not had bleeding, INR slightly high with antibiotic today and coumadin dose adjusted. Legs are a little less tightly swollen but still very uncomfortable. She tried ace bandages too tightly x1 and we discussed again. RN was looking into assistance for TEDS. She has had no fever,  no flank pain, no urine odor now. The area of right PCN is improved. She is eating and drinking fluids.  Remainder of 10 point Review of Systems negative/ unchanged.  We have discussed code status. She does not want resuscitation or life support due to the  underlying metastatic disease, but does want interventions short of that to improve situation as possible. DNR noted in EMR and I have given her out of facility DNR paper now. Mother is in agreement.  Objective:  Vital signs in last 24 hours:  BP 124/67  Pulse 90  Temp 98.5 F (36.9 C) (Oral)  Ht 5\' 6"  (1.676 m)  Wt 265 lb 6.4 oz (120.385 kg)  BMI 42.84 kg/m2  LMP 04/09/2011 Weight is up 4 lbs. Ambulatory in exam room with cane. Looks moderately uncomfortable, fully alert and appropriate, respirations not labored at rest on RA  HEENT:mucous membranes moist, pharynx normal without lesions. Poor dentition. PERRL, not icteric. Alopecia LymphaticsCervical, supraclavicular, and axillary nodes normal. Resp: clear to auscultation bilaterally Cardio: regular rate and rhythm at 90 GI: full, few bowel sounds, not obviously more distended, obese, nothing palpable. Extremities: edema 3+ bilateral LE tho just a little less tight today, no weeping and no erythema or heat. Neuro nonfocal Skin without rash or ecchymosis. Area below right PCN healed with exception of superficial open just at tubing insertion.No drainage on dressings, no bleeding. Clear urine in both bags. Portacath -without erythema or tenderness  Lab Results:   Basename 09/28/11 1300  WBC 15.7*  HGB 8.6*  HCT 26.8*  PLT 421*   ANC 12  INR 3.1 BMET last 09-24-11   Studies/Results: CT angio chest 09-24-11  IMPRESSION:  1. Progressive metastatic disease involving the lungs,  mediastinal/hilar lymph nodes, liver and spleen.  2. Coronary artery calcification and pulmonary arterial  hypertension.  We will set up CT AP due to increased pain.  Medications: I have reviewed the patient's current medications. She is given written and oral instructions to hold coumadin today, then 5 mg daily except 2.5 mg Sun,Tues,Thurs while on antibiotic. I have given her prescription for dilaudid and she is aware that she may need additional  daily laxatives. Patient was given 1 mg IV dilaudid prior to blood transfusion, with good relief of pain. Will begin tamoxifen 20 mg daily as some LMS can be sensitive to hormonal blockade; we have reviewed possible side effects and she understands that nothing will be curative, but hopefully this will be better tolerated than chemotherapy.  Assessment/Plan: 1. Metastatic leiomyosarcoma uterus: progressive at least in lungs despite taxotere/gemzar, which we have discontinued. Will use prn dilaudid for increased pain and get CT AP. I will see her back at least 10-10-11 2.Citrobacter UTI on nitrofurantoin 3.bilateral PCNs by IR 4.PAC in 5.tachycardia: not recurrent today. Consider low dose B blocker if recurs 6.RLE DVT on coumadin. No DVT LLE, with swelling there likely related to disease in pelvis +/- taxotere 7. Obesity 8.poor dentition 9. No insurance still 10. Itching with morphine 11. DNR patient's request 12.multifactorial anemia:PRBCs today, but hopefully this will improve off of chemotherapy.  Reece Packer, MD   09/28/2011, 8:36 PM

## 2011-09-28 NOTE — Progress Notes (Signed)
At 1436h, pt. Given dilaudid 1mg  IV for lower back pain. Pain level at 15 prior to pain med and at 1457, voiced improvement at level 10 but began c/o'ing of nausea. Order of zofran given.

## 2011-09-28 NOTE — Telephone Encounter (Signed)
Faxed signed order dated 09-19-11 for thigh high teds. Compression 15-20 or 20-30

## 2011-09-28 NOTE — Patient Instructions (Addendum)
Skip your coumadin today, 09-28-11 Coumadin 5 mg:  One half tablet = 2.5 mg SUN, TUES, THURS and one tablet = 5 mg all other days while you are on the antibiotic. (After you finish antibiotic, I expect that we will go back to coumadin 5 mg daily)   Dilaudid (hydromorphone) 2 mg 1-2 every 4-6 hrs as needed for pain. You will need to take miralax and Vear Clock at least twice daily with this, maybe more.   Check your pulse once or twice daily. If it is above 120 more than just a couple of times or if you are having symptoms, we may need to put you on a low dose medicine to slow heart rate.

## 2011-09-30 LAB — TYPE AND SCREEN

## 2011-10-01 ENCOUNTER — Other Ambulatory Visit: Payer: Medicaid Other | Admitting: Lab

## 2011-10-01 ENCOUNTER — Ambulatory Visit: Payer: Medicaid Other

## 2011-10-01 ENCOUNTER — Telehealth: Payer: Self-pay

## 2011-10-01 ENCOUNTER — Ambulatory Visit: Payer: Medicaid Other | Admitting: Oncology

## 2011-10-01 NOTE — Telephone Encounter (Signed)
Ms. Jacobowitz told that prescription was re faxed to [facility] today and was received as it was misplaced over the weekend per Dewayne Hatch at [facility]

## 2011-10-02 ENCOUNTER — Ambulatory Visit: Payer: Medicaid Other

## 2011-10-03 ENCOUNTER — Ambulatory Visit (HOSPITAL_BASED_OUTPATIENT_CLINIC_OR_DEPARTMENT_OTHER): Payer: Medicaid Other | Admitting: Physician Assistant

## 2011-10-03 ENCOUNTER — Other Ambulatory Visit: Payer: Self-pay | Admitting: Oncology

## 2011-10-03 ENCOUNTER — Ambulatory Visit (HOSPITAL_COMMUNITY)
Admission: RE | Admit: 2011-10-03 | Discharge: 2011-10-03 | Disposition: A | Discharge status: Home / Self Care | Payer: Medicaid Other | Source: Ambulatory Visit | Attending: Oncology | Admitting: Oncology

## 2011-10-03 ENCOUNTER — Telehealth: Payer: Self-pay | Admitting: Oncology

## 2011-10-03 VITALS — BP 116/67 | HR 94 | Temp 100.1°F | Ht 66.0 in | Wt 265.5 lb

## 2011-10-03 DIAGNOSIS — C55 Malignant neoplasm of uterus, part unspecified: Secondary | ICD-10-CM

## 2011-10-03 DIAGNOSIS — C787 Secondary malignant neoplasm of liver and intrahepatic bile duct: Secondary | ICD-10-CM | POA: Insufficient documentation

## 2011-10-03 DIAGNOSIS — N135 Crossing vessel and stricture of ureter without hydronephrosis: Secondary | ICD-10-CM

## 2011-10-03 DIAGNOSIS — C499 Malignant neoplasm of connective and soft tissue, unspecified: Secondary | ICD-10-CM | POA: Insufficient documentation

## 2011-10-03 DIAGNOSIS — R109 Unspecified abdominal pain: Secondary | ICD-10-CM

## 2011-10-03 DIAGNOSIS — N1330 Unspecified hydronephrosis: Secondary | ICD-10-CM | POA: Insufficient documentation

## 2011-10-03 DIAGNOSIS — C7800 Secondary malignant neoplasm of unspecified lung: Secondary | ICD-10-CM

## 2011-10-03 DIAGNOSIS — Z436 Encounter for attention to other artificial openings of urinary tract: Secondary | ICD-10-CM | POA: Insufficient documentation

## 2011-10-03 MED ORDER — IOHEXOL 300 MG/ML  SOLN: 10.0000 mL | Freq: Once | INTRAMUSCULAR | Status: DC | PRN

## 2011-10-03 MED ORDER — HYDROMORPHONE HCL 2 MG PO TABS: ORAL_TABLET | ORAL | Status: DC

## 2011-10-03 MED ORDER — IOHEXOL 300 MG/ML  SOLN
100.0000 mL | Freq: Once | INTRAMUSCULAR | Status: AC | PRN
  Administered 2011-10-03: 100 mL via INTRAVENOUS

## 2011-10-03 NOTE — Telephone Encounter (Signed)
CT AP done this afternoon reviewed on PACs and report noted in EMR. Spoke with patient by phone. She is more comfortable since increasing dose of po dilaudid this afternoon and has enough pills with new script filled today. I told her that CT does not show any acute problems including bowel blockage, but certainly large pelvic mass/ metastatic areas causing her pain. She would like to see if RT might help symptoms, tho understands mass is very large and RT may not be useful. Order placed for RT referral. She will use pain meds as instructed and will increase laxatives as needed to try to keep bowels moving. She appreciated care today and phone call.  Jama Flavors, MD

## 2011-10-03 NOTE — Procedures (Signed)
Exchange of bilateral nephrostomy tubes with fluoroscopy.

## 2011-10-04 ENCOUNTER — Other Ambulatory Visit (HOSPITAL_COMMUNITY): Payer: Medicaid Other

## 2011-10-04 ENCOUNTER — Encounter: Payer: Self-pay | Admitting: Radiation Oncology

## 2011-10-04 DIAGNOSIS — C799 Secondary malignant neoplasm of unspecified site: Secondary | ICD-10-CM | POA: Insufficient documentation

## 2011-10-04 DIAGNOSIS — I82409 Acute embolism and thrombosis of unspecified deep veins of unspecified lower extremity: Secondary | ICD-10-CM | POA: Insufficient documentation

## 2011-10-05 ENCOUNTER — Ambulatory Visit
Admission: RE | Admit: 2011-10-05 | Discharge: 2011-10-05 | Disposition: A | Discharge status: Home / Self Care | Payer: Medicaid Other | Source: Ambulatory Visit | Attending: Radiation Oncology | Admitting: Radiation Oncology

## 2011-10-05 ENCOUNTER — Encounter: Payer: Self-pay | Admitting: *Deleted

## 2011-10-05 ENCOUNTER — Encounter: Payer: Self-pay | Admitting: Radiation Oncology

## 2011-10-05 ENCOUNTER — Other Ambulatory Visit: Payer: Self-pay | Admitting: Radiation Oncology

## 2011-10-05 VITALS — BP 124/72 | HR 95 | Temp 98.0°F | Resp 20 | Wt 265.6 lb

## 2011-10-05 DIAGNOSIS — C549 Malignant neoplasm of corpus uteri, unspecified: Secondary | ICD-10-CM | POA: Insufficient documentation

## 2011-10-05 DIAGNOSIS — Z90710 Acquired absence of both cervix and uterus: Secondary | ICD-10-CM | POA: Insufficient documentation

## 2011-10-05 DIAGNOSIS — Z803 Family history of malignant neoplasm of breast: Secondary | ICD-10-CM | POA: Insufficient documentation

## 2011-10-05 DIAGNOSIS — Z801 Family history of malignant neoplasm of trachea, bronchus and lung: Secondary | ICD-10-CM | POA: Insufficient documentation

## 2011-10-05 DIAGNOSIS — C799 Secondary malignant neoplasm of unspecified site: Secondary | ICD-10-CM

## 2011-10-05 DIAGNOSIS — C7800 Secondary malignant neoplasm of unspecified lung: Secondary | ICD-10-CM | POA: Insufficient documentation

## 2011-10-05 DIAGNOSIS — C55 Malignant neoplasm of uterus, part unspecified: Secondary | ICD-10-CM

## 2011-10-05 DIAGNOSIS — Z9079 Acquired absence of other genital organ(s): Secondary | ICD-10-CM | POA: Insufficient documentation

## 2011-10-05 DIAGNOSIS — Z79899 Other long term (current) drug therapy: Secondary | ICD-10-CM | POA: Insufficient documentation

## 2011-10-05 DIAGNOSIS — C499 Malignant neoplasm of connective and soft tissue, unspecified: Secondary | ICD-10-CM | POA: Insufficient documentation

## 2011-10-05 DIAGNOSIS — Z87891 Personal history of nicotine dependence: Secondary | ICD-10-CM | POA: Insufficient documentation

## 2011-10-05 DIAGNOSIS — Z510 Encounter for antineoplastic radiation therapy: Secondary | ICD-10-CM | POA: Insufficient documentation

## 2011-10-05 DIAGNOSIS — N1330 Unspecified hydronephrosis: Secondary | ICD-10-CM | POA: Insufficient documentation

## 2011-10-05 DIAGNOSIS — Z7901 Long term (current) use of anticoagulants: Secondary | ICD-10-CM | POA: Insufficient documentation

## 2011-10-05 DIAGNOSIS — C787 Secondary malignant neoplasm of liver and intrahepatic bile duct: Secondary | ICD-10-CM | POA: Insufficient documentation

## 2011-10-05 MED ORDER — FENTANYL 25 MCG/HR TD PT72: 1.0000 | MEDICATED_PATCH | TRANSDERMAL | Status: DC

## 2011-10-05 NOTE — Progress Notes (Signed)
New Consult, 2/5/13Path: Uterus +- tubes/ovaries,neoplastic,and detached fibroid=Leiomyosarcoma,multifocal,spanning at least 17cm,tumor involves uterus and right adnexa,lymphovascular invasion,metastatic involving the lung   10/03/11 IR Nephrostomy tube change Bi/lateral Dr. Richarda Overlie  Chemotherapy taxotere/gemzar  Single,maternal Aunt breast cancer, in w/c, alert,oriented x 3 tearful, b/le , pain an 8 last pain med dilaudid 2mg  po at 0850 am today, takes q 4hours     Allergies:Aspirin=gi upset,Morphine and related=itching,Pcns=itching

## 2011-10-05 NOTE — Progress Notes (Signed)
[facility] Radiation Oncology NEW PATIENT EVALUATION  Name: Jackie Cook MRN: [id number]  Date:   10/05/2011           DOB: 1961/01/30  Status: outpatient   CC: No primary provider on file.  Reece Packer, MD    REFERRING PHYSICIAN: Reece Packer, MD   DIAGNOSIS: The primary encounter diagnosis was Metastatic cancer. A diagnosis of Malignant neoplasm of body of uterus was also pertinent to this visit.    HISTORY OF PRESENT ILLNESS:  Jackie Cook is a 51 y.o. female who is seen today for an initial consultation visit. The patient has a history of widely metastatic leiomyosarcoma. Patient was diagnosed earlier this year when she initially presented to the emergency department with pain. A CT scan of the abdomen and pelvis at that time revealed a 20 cm pelvic mass with additional tumors as well. There is severe right-sided hydronephrosis. The patient went for surgery and a 24 cm uterus extending to the bilateral pelvic sidewalls with a 15 cm retroperitoneal mass was removed. Pathology showed a multifocal leiomyosarcoma spanning 17 cm. The patient has had bilateral ureteral obstruction and now has nephrostomy tubes in place. She denies any problems with this at this time and with no ongoing hematuria. The patient has begun systemic treatment with Dr. Darrold Span. This was with Gemzar and Taxotere. Unfortunately the patient's recent imaging has shown progression of disease and her systemic treatment has been discontinued therefore.  The patient is complaining of some worsening pelvic pain. She states that this has been increasing over the last 1-1/2 weeks. She describes this pain as being present with some fullness in the pelvic region and this extends with a pressure type pain to the back as well. She denies other significant areas of pain currently. She did have a recent CT scan of the abdomen and pelvis on 10/03/2011. No acute findings were identified. There was an  interval increase in the size of the large pelvic mass now measuring 21 x 21 x 22 cm. There were new lung metastases as well as progression of the liver metastasis which was previously seen. She presents today for consideration of possible palliative radiation to this site.  PREVIOUS RADIATION THERAPY: No   PAST MEDICAL HISTORY:  has a past medical history of Anemia; H/O hiatal hernia; H/O menorrhagia; DDD (degenerative disc disease); Malignant neoplasm of body of uterus (05/31/2011); Metastatic cancer; History of chemotherapy; DVT (deep venous thrombosis); UTI (lower urinary tract infection); Obesity; DNR (do not resuscitate); History of chemotherapy; and Blood transfusion (2001, 09/28/11).     PAST SURGICAL HISTORY:  Past Surgical History  Procedure Date  . Total abdominal hysterectomy w/ bilateral salpingoophorectomy 05/22/11  . Laparotomy 05/22/2011    Procedure: EXPLORATORY LAPAROTOMY;  Surgeon: Laurette Schimke, MD PHD;  Location: [facility];  Service: Gynecology;  Laterality: N/A;  . Abdominal hysterectomy 05/22/2011    Procedure: HYSTERECTOMY ABDOMINAL;  Surgeon: Laurette Schimke, MD PHD;  Location: [facility];  Service: Gynecology;  Laterality: N/A;  with Resection of Pelvic Mass    . Salpingoophorectomy 05/22/2011    Procedure: SALPINGO OOPHERECTOMY;  Surgeon: Laurette Schimke, MD PHD;  Location: [facility];  Service: Gynecology;  Laterality: Bilateral;  . Portacath placement 06/18/2011    Dr. Mervyn Skeeters. Hoss, 8 Fr, Rt. port w/ tip in SVC  . B/l nephrostomy tubes 06/13/11     FAMILY HISTORY: family history includes Breast cancer in her maternal aunt; Heart attack in her other; and Lung cancer in  her father.   SOCIAL HISTORY:  reports that she quit smoking about 4 months ago. She has never used smokeless tobacco. She reports that she does not drink alcohol or use illicit drugs.   ALLERGIES: Aspirin; Morphine and related; and Penicillins   MEDICATIONS:  Current Outpatient Prescriptions  Medication Sig Dispense  Refill  . dexamethasone (DECADRON) 4 MG tablet Take 2 tablets (8 mg total) by mouth 2 (two) times daily with a meal. 2 tablets ( 8 mg) by mouth twice a day, the day before, the day of and the day after chemotherapy  24 tablet  1  . diphenhydrAMINE (BENADRYL) 25 mg capsule Take 25 mg by mouth every 6 (six) hours as needed. For itching      . docusate sodium (COLACE) 100 MG capsule Take 200 mg by mouth daily.       . ferrous fumarate (HEMOCYTE - 106 MG FE) 325 (106 FE) MG TABS Take 1 tablet (106 mg of iron total) by mouth 2 (two) times daily.  60 each  2  . HYDROmorphone (DILAUDID) 2 MG tablet Take 1-2 tabs every 4-6 hours as needed  40 tablet  0  . HYDROmorphone (DILAUDID) 2 MG tablet 1-2 tablets by mouth every 4 hours as needed for pain  30 tablet  0  . LORazepam (ATIVAN) 0.5 MG tablet Take 1-2 q6h prn nausea or to relax  30 tablet  1  . magnesium hydroxide (PHILLIPS MILK OF MAGNESIA) 400 MG/5ML suspension Take 5 mLs by mouth daily as needed.      . metoCLOPramide (REGLAN) 10 MG tablet Take 1 tablet before meals and at bedtime = 4 per day to help stomach empty and bowels move  60 tablet  1  . Multiple Vitamins-Minerals (MULTIVITAMIN PO) Take 1 tablet by mouth daily.      . nitrofurantoin, macrocrystal-monohydrate, (MACROBID) 100 MG capsule Take 1 capsule (100 mg total) by mouth 2 (two) times daily.  14 capsule  0  . oxyCODONE-acetaminophen (PERCOCET) 5-325 MG per tablet TAKE 1-2 TABS EVERY 4-6 HOURS AS NEEDED FOR PAIN  100 tablet  0  . polyethylene glycol powder (MIRALAX) powder Take 17 g by mouth daily.  255 g  1  . promethazine (PHENERGAN) 25 MG tablet Take 1 tablet (25 mg total) by mouth every 6 (six) hours as needed for nausea.  30 tablet  0  . tamoxifen (NOLVADEX) 20 MG tablet Take 1 tablet (20 mg total) by mouth daily.  30 tablet  2  . warfarin (COUMADIN) 5 MG tablet 1 tablet by mouth daily between 4 pm and 6 pm, or as directed  50 tablet  1  . fentaNYL (DURAGESIC - DOSED MCG/HR) 25 MCG/HR  Place 1 patch (25 mcg total) onto the skin every 3 (three) days.  10 patch  0     REVIEW OF SYSTEMS:  Negative other than as above in the history of present illness    PHYSICAL EXAM:  weight is 265 lb 9.6 oz (120.475 kg). Her temperature is 98 F (36.7 C). Her blood pressure is 124/72 and her pulse is 95. Her respiration is 20 and oxygen saturation is 93%.   General: Well-developed, in no acute distress HEENT: Normocephalic, atraumatic; oral cavity clear Neck: Supple without any lymphadenopathy Cardiovascular: Regular rate and rhythm Respiratory: Clear to auscultation bilaterally GI: Soft, normal bowel sounds present, obese with distention apparent, some tenderness present with pressure diffusely Extremities: 1+ bilateral lower stream and the edema present Neuro: No focal deficits  LABORATORY DATA:  Lab Results  Component Value Date   WBC 15.7* 09/28/2011   HGB 8.6* 09/28/2011   HCT 26.8* 09/28/2011   MCV 90.4 09/28/2011   PLT 421* 09/28/2011   Lab Results  Component Value Date   NA 137 09/24/2011   K 4.0 09/24/2011   CL 100 09/24/2011   CO2 26 09/24/2011   Lab Results  Component Value Date   ALT 11 09/03/2011   AST 12 09/03/2011   ALKPHOS 60 09/03/2011   BILITOT 0.5 09/03/2011      IMPRESSION: Pleasant 51 year old female with metastatic leiomyosarcoma. This is diffusely metastatic and has demonstrated progression on her initial course of chemotherapy. No ongoing chemotherapy at this time. She is experiencing increased pain in the pelvic region to 2 a large tumor at this site.   PLAN: I discussed with her the pros and cons and possible benefit of a palliative course of radiation to the tumor. Certainly treatment for pain as I discussed with her is a common indication for palliative radiation and she does have pain due to the large pelvic tumor. This is a very large tumor and appears quite aggressive and with the histology it also is not clear to what degree this would respond to  a course of radiation. I discussed with her a possible 2-3 week course of treatment and we had a very frank conversation regarding the uncertainty to which this may help her symptoms. Given the length of time of her symptoms, however, some shrinkage in her tumor may help her pain as this has been really worsening recently much more than what it was doing previously. It may be possible to dose escalate the anterior of the tumor to some degree to try to get a larger response and this can be further explored through the treatment planning process.  After this discussion the patient indicated that she did wish to try a course of radiation. She is being scheduled for a simulation such that we can proceed with treatment planning. We did have a spot available today but she wished to, back next week. Therefore she will be seen at that time and we will proceed with treatment planning and begin her treatment as soon as possible after this. We did discuss the possible side effects and risks of treatment, most prominently GI toxicity with such a large tumor at this location.   I spent 60 minutes minutes face to face with the patient and more than 50% of that time was spent in counseling and/or coordination of care.

## 2011-10-05 NOTE — Progress Notes (Signed)
Please see the Nurse Progress Note in the MD Initial Consult Encounter for this patient. 

## 2011-10-05 NOTE — Progress Notes (Signed)
OFFICE PROGRESS NOTE Date of Visit 10-03-11  Physicians: W.Brewster, IR, S.Dahlstedt INTERVAL HISTORY:  Patient is seen, together with a family member for a work in appointment. Patient was seen earlier this morning in interventional radiology to have her percutaneous nephrostomy tubes changed. She was complaining of abdominal pain not relieved by her current pain medications and is being seen for reevaluation. She is followed by Dr. Darrold Span for her metastatic leiomyosarcoma of uterus involving lung, with progression at least in chest by CT angio chest done 09-24-11, despite gemzar/taxotere.  History is of heavy menstrual bleeding x years, then 4 months of continuous light vaginal bleeding fall 2012, and constipation. She had also lost 50 lbs in 8 months, which was reportedly intentional. She presented to ED with pain, with CT AP 05-07-2011 in [facility] system with 20 x 16.7 x 16 cm pelvic mass thought to be uterus, with second 10.3x10.7x8.8 cm mass adjacent, an 11 x 7.5 cm nodal mass encasing right iliac vessels and severe right hydronephrosis. She was seen by [facility] 05-11-11, then went to exploratory laparotomy with supracervical hysterectomy at [facility] by Dr.Wendy Nelly Rout on 05-22-11. At surgery she had a 24 cm uterus extending to bilateral pelvic sidewalls with an ~ 15 cm retroperitoneal mass. Pathology [phone]) showed multifocal leiomyosarcoma spanning 17 cm, involving uterus and right adnexa, + LVSI. Postoperatively she was tachycardic which improved with 2 units PRBCs; she also had CT angio chest done during that hospitalization 05-24-11 which was negative for PE but did show multiple bilateral pulmonary nodules radiographically consistent with metastases. She was seen in follow up by Dr.Brewster at clinic on 05-31-2011, with increased swelling of right thigh and right back pain; venous doppler was negative for clot RLE. She had bilateral ureteral obstruction by 06-12-2011, with creatinine up to  3.76, with bilateral PCNs placed by IR. She had PAC placed by IR. She began gemzar/taxotere 06-25-11, day 1 day 8 q 21 days with neulasta support. She has required PRBC transfusions on several occasions thru this course and had IV iron on 08-06-11. She had new RLE DVT documented 08-17-11. Restaging CT 08-20-11 had fairly stable disease in chest and still significant involvement in pelvis.  Cycle 5 was to be gemzar only due to LE swelling possibly exacerbated by taxotere, however day 1 was held on 09-24-11 due to asymptomatic heart rate 150 - 160. She was evaluated in ED with sinus tachycardia vs SVT vs a flutter with 2:1 block. CT angio chest in ED was negative for PE but did show progression of pulmonary mets. She also had >100K citrobacter freundii in urine culture, begun on 14 days of nitrofurantoin ~ 09-25-11 (indeterminate sensitivity to nitrofurantoin, with other options for oral antibiotics if needed). Tachycardia resolved in ED and has not been symptomatic since; I have asked her to check pulse 1-2x daily as she may need trial of low dose B blocker. I spoke with patient by phone re CT results, and she is aware that further gemzar/taxotere would not be useful and that this combination is usually most effective chemotherapy option. I have mentioned other chemotherapy with more significant side effects (ifosfamide, adria) which she does not want. We did set up PRBCs to be given today, with hgb 8.6.  Patient has progressive pain in themid/lower abdomen that started about 2:00 this morning. Patient reports that she took one 2 mg Dilaudid tablets at 4 AM without relief. She then states that she took 2 lorazepam tablets in order to try to relax prior to getting  her nephrostomy tubes taken. She has had no other pain pills other than the one tablet that she took about 4:00 this morning. Her instructions from Dr. Darrold Span were for her to take 1 or 2 tablets of the Dilaudid every 4-6 hours if she needed for pain. She admits  to not following these instructions. She states that she was concerned that she would run out of pain pills and then be unable to get a refill. She reports that the abdominal pain radiates through to her back. She did have a soft bowel movement at about 9:00 this morning. She  denies any specific constipation.  She continues coumadin.  Legs remain a little less tightly swollen but still very uncomfortable.  She has had no fever, no flank pain, no urine odor now. The area of right PCN is improved. She is eating and drinking fluids relatively well.  Remainder of 10 point Review of Systems negative/ unchanged.  CODE STATUS has been discussed.. She does not want resuscitation or life support due to the underlying metastatic disease, but does want interventions short of that to improve situation as possible. DNR noted in EMR and I have given her out of facility DNR paper now. Mother is in agreement.  Objective:  Vital signs in last 24 hours:  BP 116/67  Pulse 94  Temp 100.1 F (37.8 C) (Oral)  Ht 5\' 6"  (1.676 m)  Wt 265 lb 8 oz (120.43 kg)  BMI 42.85 kg/m2  LMP 04/09/2011 Weight is stable. Ambulatory in exam room with cane. Looks moderately uncomfortable, fully alert and appropriate, respirations not labored at rest on RA  HEENT:mucous membranes moist, pharynx normal without lesions. Poor dentition. PERRL, not icteric. Alopecia LymphaticsCervical, supraclavicular, and axillary nodes normal. Resp: clear to auscultation bilaterally Cardio: regular rate and rhythm at 90 GI: Bowel sounds present in all quadrants, point tenderness from mid abdomen at the level of the umbilicus down tender generally tender to palpation in these areas without rebound or referred pain Extremities: edema 2+ bilateral LE.  just a little less tight today, however remained tender to palpation. no weeping and no erythema or heat. Neuro nonfocal Skin without rash or ecchymosis. Clean dry dressings over both PCN .No drainage on  dressings, no bleeding. Clear urine in both bags. Portacath -without erythema or tenderness  Lab Results:  No results found for this basename: WBC:2,HGB:2,HCT:2,PLT:2 in the last 72 hours ANC 12  INR 3.1 BMET last 09-24-11   Studies/Results: CT angio chest 09-24-11  IMPRESSION:  1. Progressive metastatic disease involving the lungs,  mediastinal/hilar lymph nodes, liver and spleen.  2. Coronary artery calcification and pulmonary arterial  hypertension.  We will arrange for CT AP to be done later this afternoon due to increased pain.  Medications: I have reviewed the patient's current medications. The instructions on how to take her pain medications was again reviewed with the patient. She was given a prescription for an additional 30, 2 mg Dilaudid tablets, one to 2 tablets by mouth every 4 hours as needed for pain.   tamoxifen 20 mg daily as some LMS can be sensitive to hormonal blockade  Assessment/Plan: 1. Metastatic leiomyosarcoma uterus: progressive at least in lungs despite taxotere/gemzar, which we have discontinued. Will use prn dilaudid for increased pain and get CT AP. Patient discussed with Dr. Darrold Span. The CT of the abdomen and pelvis will be performed later this afternoon and she will see her in followup to discuss those results as previously scheduled on 10/10/2011.  2.Citrobacter  UTI on nitrofurantoin 3.bilateral PCNs by IR-both tubes changed today. 4.PAC in 5.tachycardia: not recurrent today. Consider low dose B blocker if recurs 6.RLE DVT on coumadin. No DVT LLE, with swelling there likely related to disease in pelvis +/- taxotere 7. Obesity 8.poor dentition 9. No insurance still 10. Itching with morphine 11. DNR patient's request 12.multifactorial anemia:   Conni Slipper, PA-C   10/05/2011, 2:53 PM

## 2011-10-08 ENCOUNTER — Encounter: Payer: Self-pay | Admitting: Oncology

## 2011-10-08 ENCOUNTER — Telehealth: Payer: Self-pay

## 2011-10-08 NOTE — Progress Notes (Signed)
I did talk with ann over at [facility] [employer] and she said that the ted hose will cost $120.00,so I call Ms.Timmons to let her know how much money she has left in the [facility] fund which is $98.97.I also told her that this would take all of her money from the fund for medication and she said she has medicaid and she could afford $3.00 co-pay for her medication.I did talk with Fonnie Mu and she said that was fine,just have them to send the bill to her.I also told Ms.Meadowcroft that she would have to add $21.03 to the hose and she said that was fine also.

## 2011-10-08 NOTE — Telephone Encounter (Signed)
Ms. Lawniczak called to say that she just got her thigh high ted hose. They are tough to put on but they feel good now.  She wanted to know if she should sleep in them.  She cannot remember if Dr. Darrold Span said if she could.

## 2011-10-08 NOTE — Progress Notes (Signed)
Patient received one prescription from [facility] on 10/03/11 $3.00,her remaninig balance [facility] $160.77.

## 2011-10-09 ENCOUNTER — Telehealth: Payer: Self-pay

## 2011-10-09 NOTE — Telephone Encounter (Signed)
Told Jackie Cook it is ok to sleep in hose if if comfortable per Dr. Darrold Span.  She needs to take them off at least every 24 hours to be sure skin is ok under neath hose.

## 2011-10-10 ENCOUNTER — Ambulatory Visit (HOSPITAL_BASED_OUTPATIENT_CLINIC_OR_DEPARTMENT_OTHER): Payer: Medicaid Other | Admitting: Oncology

## 2011-10-10 ENCOUNTER — Other Ambulatory Visit: Payer: Medicaid Other | Admitting: Lab

## 2011-10-10 ENCOUNTER — Telehealth: Payer: Self-pay | Admitting: Oncology

## 2011-10-10 ENCOUNTER — Ambulatory Visit: Payer: Medicaid Other | Admitting: Oncology

## 2011-10-10 ENCOUNTER — Ambulatory Visit: Payer: Medicaid Other | Admitting: Radiation Oncology

## 2011-10-10 ENCOUNTER — Encounter: Payer: Self-pay | Admitting: Oncology

## 2011-10-10 ENCOUNTER — Other Ambulatory Visit: Payer: Medicaid Other | Admitting: Radiation Oncology

## 2011-10-10 ENCOUNTER — Encounter: Payer: Self-pay | Admitting: *Deleted

## 2011-10-10 ENCOUNTER — Ambulatory Visit (HOSPITAL_BASED_OUTPATIENT_CLINIC_OR_DEPARTMENT_OTHER): Payer: Medicaid Other

## 2011-10-10 VITALS — BP 134/66 | HR 95 | Temp 98.5°F | Ht 65.0 in | Wt 264.0 lb

## 2011-10-10 DIAGNOSIS — C787 Secondary malignant neoplasm of liver and intrahepatic bile duct: Secondary | ICD-10-CM

## 2011-10-10 DIAGNOSIS — C55 Malignant neoplasm of uterus, part unspecified: Secondary | ICD-10-CM

## 2011-10-10 DIAGNOSIS — C7800 Secondary malignant neoplasm of unspecified lung: Secondary | ICD-10-CM

## 2011-10-10 DIAGNOSIS — I82409 Acute embolism and thrombosis of unspecified deep veins of unspecified lower extremity: Secondary | ICD-10-CM

## 2011-10-10 LAB — CBC WITH DIFFERENTIAL/PLATELET
BASO%: 0.3 % (ref 0.0–2.0)
Eosinophils Absolute: 0.2 10*3/uL (ref 0.0–0.5)
MONO#: 1 10*3/uL — ABNORMAL HIGH (ref 0.1–0.9)
NEUT#: 9 10*3/uL — ABNORMAL HIGH (ref 1.5–6.5)
Platelets: 324 10*3/uL (ref 145–400)
RBC: 3.17 10*6/uL — ABNORMAL LOW (ref 3.70–5.45)
RDW: 19.2 % — ABNORMAL HIGH (ref 11.2–14.5)
WBC: 11.7 10*3/uL — ABNORMAL HIGH (ref 3.9–10.3)
lymph#: 1.4 10*3/uL (ref 0.9–3.3)

## 2011-10-10 LAB — PROTIME-INR

## 2011-10-10 NOTE — Progress Notes (Signed)
OFFICE PROGRESS NOTE Date of Visit 10-10-11 Physicians: W.Brewster, S.Dahlstedt, J.Moody, Interventional Radiology  INTERVAL HISTORY:  Patient is seen, together with mother, in continuing attention to her metastatic leiomyosarcoma of uterus, which progressed on gemcitabine/taxotere and is now being treated in palliative attempt with tamoxifen, which was begun 09-28-11 She is also to begin palliative radiation therapy by Dr.Moody, simulation tomorrow. History is of  4 months of continuous vaginal bleeding fall 2012, and constipation. She had also lost 50 lbs in 8 months, which was reportedly intentional. She presented to ED with pain, with CT AP 05-07-2011 in [facility] with 20 x 16.7 x 16 cm pelvic mass thought to be uterus, with second 10.3x10.7x8.8 cm mass adjacent, an 11 x 7.5 cm nodal mass encasing right iliac vessels and severe right hydronephrosis. She was seen by [facility] 05-11-11, then went to exploratory laparotomy with supracervical hysterectomy at [facility] by Dr.Wendy Nelly Rout on 05-22-11. At surgery she had a 24 cm uterus extending to bilateral pelvic sidewalls with an ~ 15 cm retroperitoneal mass. Pathology [phone]) showed multifocal leiomyosarcoma spanning 17 cm, involving uterus and right adnexa, + LVSI. Postoperatively she was tachycardic which improved with 2 units PRBCs; she also had CT angio chest done during that hospitalization 05-24-11 which was negative for PE but did show multiple bilateral pulmonary nodules radiographically consistent with metastases.  She had bilateral ureteral obstruction by 06-12-2011, with creatinine up to 3.76, with bilateral PCNs placed by IR. She had PAC placed by IR. She began gemzar/taxotere 06-25-11, day 1 day 8 q 21 days with neulasta support. She has required PRBC transfusions on several occasions thru this course and had IV iron on 08-06-11. She had new RLE DVT documented 08-17-11 and continues coumadin. CT angio chest 09-24-11 (done in ED due to  tachycardia, HR 160) after 4 cycles of gemzar/taxotere showed progression of pulmonary mets; CT AP 10-03-11 had progressive hepatic mets and pelvic mass now 21 x21 x22 cm. Systemic chemotherapy has been discontinued; she is tolerating the tamoxifen without difficulty. RT planned as above, Dr Joellen Jersey help appreciated. She has PAC in, last flushed 09-28-11. IR manages bilateral percutaneous nephrostomies.  Pain has been better controlled now on duragesic 25 micrograms every 72 hrs with dilaudid 4 mg every 4 hrs generally round the clock; she is almost out of dilaudid, so has been using extra prn percocet for past day. Pain is mostly mid to lower abdomen thru to back. She can tell some improvement in LE swelling with thigh high Teds, tho increased swelling in upper thighs above those. She has had no bleeding. She has had no fever, has completed antibiotics for most recent documented UTI (that culture 09-24-11). She had one episode of symptomatic tachycardia on 10-06-11, HR counted at 160 and lasted 10 min; this was the first episode that she has had since ED on 09-24-11.Bowels have been moving mostly daily, and x3 today. Appetite is not good, but she is eating some and drinking water, and she denies nausea. The PCNs are not causing any increased discomfort now. She denies SOB at rest.  She has had no fever or symptoms of infection. She denies bleeding. Remainder of 10 point Review of Systems negative.  Objective:  Vital signs in last 24 hours:  BP 134/66  Pulse 95  Temp 98.5 F (36.9 C) (Oral)  Ht 5\' 5"  (1.651 m)  Wt 264 lb (119.75 kg)  BMI 43.93 kg/m2  LMP 04/09/2011 This weight is down 1 lb. Respirations not labored RA and she  is ambulatory without assistance.   HEENT:mucous membranes moist, pharynx normal without lesions. Extremely poor dentition, chronic. PERRL, not icteric LymphaticsCervical, supraclavicular, and axillary nodes normal. Resp: somewhat diminished breath sounds bilaterally with  scattered minimal crackles, no wheezes. Clear to percussion.. Cardio: regular rate and rhythm GI: obese, postitve bowel sounds, not tender to gentle palpation, no skin rash, nothing palpable Extremities: lower extremity edema, 3+ but not quite tight on left and 3+ and a little softer on left, to thighs bilaterally Neuro:nonfocal Skin without rash or ecchymoses Portacath without erythema, last flushed 09-28-11 PCNs dressed, clear urine in both bags. Lab Results:   Basename 10/10/11 1228  WBC 11.7*  HGB 8.8*  HCT 26.9*  PLT 324  She was last transfused PRBCs on 09-28-11 ANC 9.0 INR 3.8 on 5 mg coumadin daily BMET Last 09-24-11 Studies/Results: CT AP 10-03-11 as discussed with patient by phone last week.  Medications: I have reviewed the patient's current medications. I have given prescriptions for dilaudid now 4 mg tablets one every 4 hrs prn pain # 100, percocet 5/325 1-2 every 6 hrs prn # 100, and metoprolol XL 25 mg to begin with 1/2 tablet = 12.5 mg daily. She has ~ eight 25 microgram fentanyl patches left and will increase this to 50 mcg every 72 hrs. MOM is more helpful for constipation than miralax. She has been instructed to hold coumadin today, then decrease dose to 2.5 mg Tues/Thurs/Sat and 5 mg alternate days.  Assessment/Plan: 1.Metastatic leiomyosarcoma of uterus: progression in lung, liver and pelvis despite gemzar/ taxotere. We do not plan to try further chemotherapy. Tamoxifen recently begun. RT planned. Increase duragesic, continue prn pain meds.I will see her back ~ 10-30-11 or sooner if needed, INR and CBC then 2. Percutaneous nephrostomy tubes in by IR 3.PAC, flushed 09-28-11 4.episodic tachycardia: begin metoprolol at 12.5 mg daily 5. NCB, patient's request 6.LE swelling: DVT on right, taxotere, pelvic tumor. Teds, elevation. Continue coumadin at decreased dose as above, repeat INR next week. 7.multifactorial anemia: does not seem more symptomatic today.  Follow  Patient was in agreement with all of plan as above. Written and oral instructions given for the coumadin and pain medication.    Jorgia Manthei P, MD   10/10/2011, 4:15 PM

## 2011-10-10 NOTE — Telephone Encounter (Signed)
Gv pt appt for [id number] °

## 2011-10-10 NOTE — Patient Instructions (Signed)
Coumadin: none today, then 1/2 of 5 mg tablet (=2.5 mg) on Tues/Thurs/Sat and 5 mg MWF/Sun We'll check coumadin level again next week.  Increase fentanyl patch to 50 micrograms ever 3 days (2 of the 25 microgram patches)  Try to keep bowels moving daily  Take compression stockings off at least once every 24 hours

## 2011-10-10 NOTE — Progress Notes (Signed)
[facility]  Clinical Social Work  Clinical Social Work met with patient and patient's mother in office today to complete healthcare advance directives.  Jackie Cook completed the healthcare living will and health care power of attorney.  She designated her mother as her health care agent. CSW will send documents to HIM to be scanned into patient's medical record. To access, look under Chart Review > Media Tab.  CSW also encouraged patient to attend the GYN monthly care group. Patient had no other questions.  Kathrin Penner, MSW, [facility] Clinical Social Worker [facility] [phone]

## 2011-10-11 ENCOUNTER — Ambulatory Visit
Admission: RE | Admit: 2011-10-11 | Discharge: 2011-10-11 | Disposition: A | Discharge status: Home / Self Care | Payer: Medicaid Other | Source: Ambulatory Visit | Attending: Radiation Oncology | Admitting: Radiation Oncology

## 2011-10-11 DIAGNOSIS — C549 Malignant neoplasm of corpus uteri, unspecified: Secondary | ICD-10-CM

## 2011-10-12 ENCOUNTER — Other Ambulatory Visit: Payer: Self-pay

## 2011-10-12 DIAGNOSIS — C55 Malignant neoplasm of uterus, part unspecified: Secondary | ICD-10-CM

## 2011-10-12 MED ORDER — METOPROLOL SUCCINATE ER 25 MG PO TB24: 25.0000 mg | ORAL_TABLET | Freq: Every day | ORAL | Status: DC

## 2011-10-12 MED ORDER — HYDROMORPHONE HCL 4 MG PO TABS: 4.0000 mg | ORAL_TABLET | ORAL | Status: DC | PRN

## 2011-10-12 MED ORDER — OXYCODONE-ACETAMINOPHEN 5-325 MG PO TABS: ORAL_TABLET | ORAL | Status: DC

## 2011-10-12 NOTE — Progress Notes (Signed)
  Radiation Oncology         [phone] ________________________________  Name: Jackie Cook MRN: [id number]  Date: 10/11/2011  DOB: 1960/07/20  SIMULATION AND TREATMENT PLANNING NOTE  DIAGNOSIS:  Uterine leiomyosarcoma  NARRATIVE:  The patient was brought to the CT Simulation planning suite.  Identity was confirmed.  All relevant records and images related to the planned course of therapy were reviewed.   Written consent to proceed with treatment was confirmed which was freely given after reviewing the details related to the planned course of therapy had been reviewed with the patient.  Then, the patient was set-up in a stable reproducible  supine position for radiation therapy.  the patient had a difficult time lying flat and therefore some padding was necessary to use for her set up. This was the only way that she was able to get in a suitable position for treatment. CT images were obtained.  Surface markings were placed.    The CT images were loaded into the planning software.  Then the target and avoidance structures were contoured.  Treatment planning then occurred.  The radiation prescription was entered and confirmed.  A total of 4 complex treatment devices were fabricated which relate to the designed radiation treatment fields. Each of these customized fields/ complex treatment devices will be used on a daily basis during the radiation course. I have requested : 3D Simulation  I have requested a DVH of the following structures: GTV, femoral heads, bowel.   PLAN:  The patient will receive 30 Gy in 10 fractions.  ________________________________   Radene Gunning, MD, PhD

## 2011-10-15 ENCOUNTER — Ambulatory Visit
Admission: RE | Admit: 2011-10-15 | Discharge: 2011-10-15 | Disposition: A | Discharge status: Home / Self Care | Payer: Medicaid Other | Source: Ambulatory Visit | Attending: Radiation Oncology | Admitting: Radiation Oncology

## 2011-10-15 DIAGNOSIS — C549 Malignant neoplasm of corpus uteri, unspecified: Secondary | ICD-10-CM

## 2011-10-16 ENCOUNTER — Ambulatory Visit
Admission: RE | Admit: 2011-10-16 | Discharge: 2011-10-16 | Disposition: A | Discharge status: Home / Self Care | Payer: Medicaid Other | Source: Ambulatory Visit | Attending: Radiation Oncology | Admitting: Radiation Oncology

## 2011-10-16 NOTE — Progress Notes (Signed)
  Radiation Oncology         [phone] ________________________________  Name: Jackie Cook MRN: [id number]  Date: 10/15/2011  DOB: [birth year]  Simulation Verification Note   NARRATIVE: The patient was brought to the treatment unit and placed in the planned treatment position. The clinical setup was verified. Then port films were obtained and uploaded to the radiation oncology medical record software.  The treatment beams were carefully compared against the planned radiation fields. The position, location, and shape of the radiation fields was reviewed. The targeted volume of tissue appears to be appropriately covered by the radiation beams. Based on my personal review, I approved the simulation verification. The patient's treatment will proceed as planned.  ________________________________   Radene Gunning, MD, PhD

## 2011-10-17 ENCOUNTER — Ambulatory Visit
Admission: RE | Admit: 2011-10-17 | Discharge: 2011-10-17 | Disposition: A | Discharge status: Home / Self Care | Payer: Medicaid Other | Source: Ambulatory Visit | Attending: Radiation Oncology | Admitting: Radiation Oncology

## 2011-10-19 ENCOUNTER — Encounter: Payer: Self-pay | Admitting: Pharmacist

## 2011-10-19 ENCOUNTER — Telehealth: Payer: Self-pay

## 2011-10-19 ENCOUNTER — Other Ambulatory Visit: Payer: Self-pay | Admitting: Pharmacist

## 2011-10-19 ENCOUNTER — Other Ambulatory Visit: Payer: Medicaid Other | Admitting: Lab

## 2011-10-19 ENCOUNTER — Ambulatory Visit
Admission: RE | Admit: 2011-10-19 | Discharge: 2011-10-19 | Disposition: A | Discharge status: Home / Self Care | Payer: Medicaid Other | Source: Ambulatory Visit | Attending: Radiation Oncology | Admitting: Radiation Oncology

## 2011-10-19 ENCOUNTER — Encounter: Payer: Self-pay | Admitting: Radiation Oncology

## 2011-10-19 VITALS — BP 101/62 | HR 96 | Temp 98.8°F | Resp 20

## 2011-10-19 DIAGNOSIS — C549 Malignant neoplasm of corpus uteri, unspecified: Secondary | ICD-10-CM

## 2011-10-19 DIAGNOSIS — I82409 Acute embolism and thrombosis of unspecified deep veins of unspecified lower extremity: Secondary | ICD-10-CM

## 2011-10-19 DIAGNOSIS — C55 Malignant neoplasm of uterus, part unspecified: Secondary | ICD-10-CM

## 2011-10-19 LAB — PROTHROMBIN TIME
INR: 4.36 — ABNORMAL HIGH (ref <1.50)
Prothrombin Time: 42.3 seconds — ABNORMAL HIGH (ref 11.6–15.2)

## 2011-10-19 LAB — PROTIME-INR

## 2011-10-19 NOTE — Progress Notes (Signed)
Pt referred to Coumadin clinic by Dr. Darrold Span  H/O DVT (dx 08/17/11).  Most recent dose of Coumadin was 5 mg/day; 2.5 mg Tu/Th/Sat. INR = 4.3 today & MD instructed pt to hold 3 days of Coumadin & recheck INR on Mon 7/8 (coordinating w/ XRT). INR goal = 2-3 Length of anticoag: indefinite Risk Factors for bleeding: percutaneous nephrostomy tubes, malignancy & anemia We will see pt for her first Coumadin clinic visit on 10/22/11.  I left vm on pts home phone to make her aware of this appt prior to XRT same day. Marily Lente, Pharm.D.

## 2011-10-19 NOTE — Progress Notes (Signed)
Patient in w/c back to see Md, cPT/INR high 4.36, no coumadin today, pain in her stomach is "8" just took dilaudid med 1pm, not eating well stated patient, Zenovia Jarred ahs spoken with patient, patient, drinks 2 cans ensure daily stated patient Post sim teaching, radiation therapy and you book given with calander and business card, teach back verbal understanding stated by patient,sees MD weekly/prn can call for any questions,concerns,pateint asked if this doesn't work is this the end?" will defer that question to th MD's 2:00 PM

## 2011-10-19 NOTE — Progress Notes (Signed)
Department of Radiation Oncology  Phone:  [phone] Fax:        [phone]  Weekly Treatment Note    Name: Jackie Cook Date: 10/19/2011 MRN: [id number] DOB: 07-Jun-1960   Current dose: 9 Gy  Current fraction: 3   MEDICATIONS: Current Outpatient Prescriptions  Medication Sig Dispense Refill  . diphenhydrAMINE (BENADRYL) 25 mg capsule Take 25 mg by mouth every 6 (six) hours as needed. For itching      . docusate sodium (COLACE) 100 MG capsule Take 200 mg by mouth daily.       . fentaNYL (DURAGESIC - DOSED MCG/HR) 25 MCG/HR Place 2 patches onto the skin every 3 (three) days.      . ferrous fumarate (HEMOCYTE - 106 MG FE) 325 (106 FE) MG TABS Take 1 tablet (106 mg of iron total) by mouth 2 (two) times daily.  60 each  2  . HYDROmorphone (DILAUDID) 4 MG tablet Take 1 tablet (4 mg total) by mouth every 4 (four) hours as needed for pain.  30 tablet  0  . LORazepam (ATIVAN) 0.5 MG tablet Take 1-2 q6h prn nausea or to relax  30 tablet  1  . magnesium hydroxide (PHILLIPS MILK OF MAGNESIA) 400 MG/5ML suspension Take 5 mLs by mouth daily as needed.      . metoCLOPramide (REGLAN) 10 MG tablet Take 1 tablet before meals and at bedtime = 4 per day to help stomach empty and bowels move  60 tablet  1  . metoprolol succinate (TOPROL-XL) 25 MG 24 hr tablet Take 1 tablet (25 mg total) by mouth daily. For rapid heart rate.  Begin with 1/2 tab daily  30 tablet  0  . Multiple Vitamins-Minerals (MULTIVITAMIN PO) Take 1 tablet by mouth daily.      Marland Kitchen oxyCODONE-acetaminophen (PERCOCET) 5-325 MG per tablet TAKE 1-2 TABS EVERY 4-6 HOURS AS NEEDED FOR PAIN  100 tablet  0  . polyethylene glycol powder (MIRALAX) powder Take 17 g by mouth daily.  255 g  1  . promethazine (PHENERGAN) 25 MG tablet Take 1 tablet (25 mg total) by mouth every 6 (six) hours as needed for nausea.  30 tablet  0  . tamoxifen (NOLVADEX) 20 MG tablet Take 1 tablet (20 mg total) by mouth daily.  30 tablet  2  . warfarin  (COUMADIN) 5 MG tablet 1 tablet by mouth daily between 4 pm and 6 pm, or as directed  50 tablet  1     ALLERGIES: Aspirin; Morphine and related; and Penicillins   LABORATORY DATA:  Lab Results  Component Value Date   WBC 11.7* 10/10/2011   HGB 8.8* 10/10/2011   HCT 26.9* 10/10/2011   MCV 84.9 10/10/2011   PLT 324 10/10/2011   Lab Results  Component Value Date   NA 137 09/24/2011   K 4.0 09/24/2011   CL 100 09/24/2011   CO2 26 09/24/2011   Lab Results  Component Value Date   ALT 11 09/03/2011   AST 12 09/03/2011   ALKPHOS 60 09/03/2011   BILITOT 0.5 09/03/2011     NARRATIVE: Jackie Cook was seen today for weekly treatment management. The chart was checked and the patient's films were reviewed. Patient states that her pain has been stable. She is taking dialogue it and she describes the pain as an 8 at of 10 currently. No ongoing diarrhea or nausea. We did discuss that she can use Imodium if needed.  PHYSICAL EXAMINATION: oral temperature is 98.8 F (  37.1 C). Her blood pressure is 101/62 and her pulse is 96. Her respiration is 20.      sitting in a wheelchair. No acute distress.  ASSESSMENT: The patient is doing satisfactorily with treatment.  PLAN: We will continue with the patient's radiation treatment as planned.

## 2011-10-19 NOTE — Telephone Encounter (Signed)
Ms. Allmon is not having any bleeding issues.  INR is 42.3.  She will hold coumadin until repeat lab on 10-22-11 with the coumadin clinic as per Dr. Darrold Span.  She feels good other than fatigued.

## 2011-10-22 ENCOUNTER — Ambulatory Visit: Payer: Medicaid Other

## 2011-10-22 ENCOUNTER — Ambulatory Visit: Payer: Medicaid Other | Admitting: Pharmacist

## 2011-10-22 ENCOUNTER — Other Ambulatory Visit (HOSPITAL_BASED_OUTPATIENT_CLINIC_OR_DEPARTMENT_OTHER): Payer: Medicaid Other | Admitting: Lab

## 2011-10-22 ENCOUNTER — Ambulatory Visit
Admission: RE | Admit: 2011-10-22 | Discharge: 2011-10-22 | Disposition: A | Discharge status: Home / Self Care | Payer: Medicaid Other | Source: Ambulatory Visit | Attending: Radiation Oncology | Admitting: Radiation Oncology

## 2011-10-22 DIAGNOSIS — I82409 Acute embolism and thrombosis of unspecified deep veins of unspecified lower extremity: Secondary | ICD-10-CM

## 2011-10-22 DIAGNOSIS — C55 Malignant neoplasm of uterus, part unspecified: Secondary | ICD-10-CM

## 2011-10-22 LAB — CBC WITH DIFFERENTIAL/PLATELET
Eosinophils Absolute: 0.2 10*3/uL (ref 0.0–0.5)
HGB: 8.3 g/dL — ABNORMAL LOW (ref 11.6–15.9)
MCV: 81.3 fL (ref 79.5–101.0)
MONO%: 8.3 % (ref 0.0–14.0)
NEUT#: 6.2 10*3/uL (ref 1.5–6.5)
RBC: 3.16 10*6/uL — ABNORMAL LOW (ref 3.70–5.45)
RDW: 19.6 % — ABNORMAL HIGH (ref 11.2–14.5)
WBC: 7.9 10*3/uL (ref 3.9–10.3)
lymph#: 0.8 10*3/uL — ABNORMAL LOW (ref 0.9–3.3)
nRBC: 0 % (ref 0–0)

## 2011-10-22 LAB — POCT INR: INR: 2.9

## 2011-10-22 LAB — PROTIME-INR
INR: 2.9 (ref 2.00–3.50)
Protime: 34.8 Seconds — ABNORMAL HIGH (ref 10.6–13.4)

## 2011-10-22 NOTE — Progress Notes (Signed)
INR = 2.9 after holding 3 doses of Coumadin. Pt was previously on Coumadin 2.5 mg Tu/Th/Sat; 5 mg other days = 22.5 mg/week w/ 2 supratherapeutic INR's. No complications re: supratherapeutic INR. I will reduce her dose this week to 1 mg today then 2.5 mg/day. Repeat INR next Monday (coordinate w/ XRT). I provided pt w/ 10 samples of Coumadin 1 mg.  She uses brand Coumadin provided as samples.  She has no insurance. ** Note: pt likes to be called "Ginny".  She was accompanied today by her friend, Everardo Pacific.Marily Lente, Pharm.D.

## 2011-10-23 ENCOUNTER — Encounter: Payer: Self-pay | Admitting: *Deleted

## 2011-10-23 ENCOUNTER — Other Ambulatory Visit: Payer: Self-pay | Admitting: *Deleted

## 2011-10-23 ENCOUNTER — Ambulatory Visit
Admission: RE | Admit: 2011-10-23 | Discharge: 2011-10-23 | Disposition: A | Discharge status: Home / Self Care | Payer: Medicaid Other | Source: Ambulatory Visit | Attending: Radiation Oncology | Admitting: Radiation Oncology

## 2011-10-23 DIAGNOSIS — C801 Malignant (primary) neoplasm, unspecified: Secondary | ICD-10-CM

## 2011-10-23 MED ORDER — PERCOCET 10-325 MG PO TABS: 1.0000 | ORAL_TABLET | Freq: Four times a day (QID) | ORAL | Status: DC | PRN

## 2011-10-23 MED ORDER — FENTANYL 100 MCG/HR TD PT72: 1.0000 | MEDICATED_PATCH | TRANSDERMAL | Status: DC

## 2011-10-23 NOTE — Telephone Encounter (Signed)
Pt left message requesting refill on Percocet. States she will be at [facility] for Radiation today at 10:00 and can pick up at that time if possible. ( last dispensed 10/10/11 100 tabs)

## 2011-10-23 NOTE — Progress Notes (Signed)
RECEIVED A FAX FROM [facility] CONCERNING A PRIOR AUTHORIZATION FOR FENTANYL. THIS REQUEST WAS GIVEN TO ELIZABETH SUTTON IN MANAGED CARE.

## 2011-10-24 ENCOUNTER — Encounter: Payer: Self-pay | Admitting: Oncology

## 2011-10-24 ENCOUNTER — Encounter: Payer: Self-pay | Admitting: Radiation Oncology

## 2011-10-24 ENCOUNTER — Ambulatory Visit
Admission: RE | Admit: 2011-10-24 | Discharge: 2011-10-24 | Disposition: A | Discharge status: Home / Self Care | Payer: Medicaid Other | Source: Ambulatory Visit | Attending: Radiation Oncology | Admitting: Radiation Oncology

## 2011-10-24 ENCOUNTER — Telehealth: Payer: Self-pay

## 2011-10-24 VITALS — BP 104/68 | HR 89 | Resp 18 | Wt 250.2 lb

## 2011-10-24 DIAGNOSIS — C549 Malignant neoplasm of corpus uteri, unspecified: Secondary | ICD-10-CM

## 2011-10-24 NOTE — Telephone Encounter (Signed)
Spoke with [facility] pharmacist and the prescription for the Duragesic patch 100 mcg went  Through insurance and pt. Was notified.

## 2011-10-24 NOTE — Progress Notes (Signed)
Patient presents to the clinic today unaccompanied for an under treat visit with Dr. Mitzi Hansen. Patient is alert and oriented to person, place, and time. No distress noted. Slow steady gait noted with assistance of cane. Pleasant affect noted. Patient reports low back and abdominal pain 6 on a scale of 0-10 despite taking Percocet 10/325 and Dilaudid prn. Patient reports a white creamy odorless vaginal discharge. Patient reports that she has the urge to void. Bilateral ureter bags noted draining yellow urine with sediment in the right bag. Patient denies diarrhea. Patient reports feeling nausea and occasionally vomiting follow radiation. Patient has ativan and phenergan prescribed for nausea and vomiting. Reported all findings to Dr. Mitzi Hansen.

## 2011-10-24 NOTE — Progress Notes (Signed)
Weekly Management Note:  Site:Pelvis Current Dose:  1800  cGy Projected Dose: 3000  cGy  Narrative: The patient is seen today for routine under treatment assessment. CBCT/MVCT images/port films were reviewed. The chart was reviewed.   She states that her "stomach is still hurting", but is unchanged of the past month. She is not having any diarrhea. Her back pain is improved but she is also taking Percocet and I wanted. She does have intermittent nausea.  Physical Examination:  Filed Vitals:   10/24/11 1043  BP: 104/68  Pulse: 89  Resp: 18  .  Weight: 250 lb 3.2 oz (113.49 kg). No change. Urine drainage bags show clear urine.  Labs:  Lab Results  Component Value Date   WBC 7.9 10/22/2011   HGB 8.3* 10/22/2011   HCT 25.7* 10/22/2011   MCV 81.3 10/22/2011   PLT 371 10/22/2011   Impression: Tolerating radiation therapy well.  Plan: Continue radiation therapy as planned.

## 2011-10-25 ENCOUNTER — Ambulatory Visit
Admission: RE | Admit: 2011-10-25 | Discharge: 2011-10-25 | Disposition: A | Discharge status: Home / Self Care | Payer: Medicaid Other | Source: Ambulatory Visit | Attending: Radiation Oncology | Admitting: Radiation Oncology

## 2011-10-26 ENCOUNTER — Ambulatory Visit
Admission: RE | Admit: 2011-10-26 | Discharge: 2011-10-26 | Disposition: A | Discharge status: Home / Self Care | Payer: Medicaid Other | Source: Ambulatory Visit | Attending: Radiation Oncology | Admitting: Radiation Oncology

## 2011-10-29 ENCOUNTER — Ambulatory Visit: Payer: Medicaid Other

## 2011-10-29 ENCOUNTER — Other Ambulatory Visit: Payer: Medicaid Other

## 2011-10-29 ENCOUNTER — Ambulatory Visit: Admission: RE | Admit: 2011-10-29 | Payer: Medicaid Other | Source: Ambulatory Visit

## 2011-10-30 ENCOUNTER — Other Ambulatory Visit: Payer: Medicaid Other | Admitting: Lab

## 2011-10-30 ENCOUNTER — Ambulatory Visit
Admission: RE | Admit: 2011-10-30 | Discharge: 2011-10-30 | Disposition: A | Discharge status: Home / Self Care | Payer: Medicaid Other | Source: Ambulatory Visit | Attending: Radiation Oncology | Admitting: Radiation Oncology

## 2011-10-30 ENCOUNTER — Ambulatory Visit (HOSPITAL_BASED_OUTPATIENT_CLINIC_OR_DEPARTMENT_OTHER): Payer: Medicaid Other | Admitting: Oncology

## 2011-10-30 ENCOUNTER — Other Ambulatory Visit (HOSPITAL_BASED_OUTPATIENT_CLINIC_OR_DEPARTMENT_OTHER): Payer: Medicaid Other | Admitting: Lab

## 2011-10-30 ENCOUNTER — Encounter: Payer: Self-pay | Admitting: Oncology

## 2011-10-30 ENCOUNTER — Telehealth: Payer: Self-pay | Admitting: Oncology

## 2011-10-30 VITALS — BP 121/63 | HR 97 | Temp 98.6°F | Ht 65.0 in | Wt 247.3 lb

## 2011-10-30 DIAGNOSIS — C55 Malignant neoplasm of uterus, part unspecified: Secondary | ICD-10-CM

## 2011-10-30 DIAGNOSIS — Z5181 Encounter for therapeutic drug level monitoring: Secondary | ICD-10-CM

## 2011-10-30 DIAGNOSIS — Z7901 Long term (current) use of anticoagulants: Secondary | ICD-10-CM

## 2011-10-30 DIAGNOSIS — I82409 Acute embolism and thrombosis of unspecified deep veins of unspecified lower extremity: Secondary | ICD-10-CM

## 2011-10-30 LAB — CBC WITH DIFFERENTIAL/PLATELET
BASO%: 0.1 % (ref 0.0–2.0)
Basophils Absolute: 0 10*3/uL (ref 0.0–0.1)
EOS%: 2.3 % (ref 0.0–7.0)
HGB: 8.1 g/dL — ABNORMAL LOW (ref 11.6–15.9)
MCH: 25.6 pg (ref 25.1–34.0)
RBC: 3.16 10*6/uL — ABNORMAL LOW (ref 3.70–5.45)
RDW: 19.6 % — ABNORMAL HIGH (ref 11.2–14.5)
lymph#: 0.5 10*3/uL — ABNORMAL LOW (ref 0.9–3.3)
nRBC: 0 % (ref 0–0)

## 2011-10-30 LAB — PROTIME-INR
INR: 1.9 — ABNORMAL LOW (ref 2.00–3.50)
Protime: 22.8 Seconds — ABNORMAL HIGH (ref 10.6–13.4)

## 2011-10-30 NOTE — Telephone Encounter (Signed)
Gave pt appt for coumadin clinic  end of July 2013, lab  On 11/22/11, pt will see Dr. Darrold Span at end of August 2013

## 2011-10-30 NOTE — Patient Instructions (Signed)
Ditropan (oxybutinin) 5 mg 2-3 times daily for bladder spasm   Continue same coumadin

## 2011-10-30 NOTE — Progress Notes (Signed)
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 She continues coumadin for LE DVT and has PAC in, last flushed 09-28-11. Bilateral percutaneous nephrostomy tubes are managed by IR.  History is of heavy menstrual bleeding x years, then 4 months of continuous vaginal bleeding fall 2012, and constipation. She had also lost 50 lbs in 8 months, which was reportedly intentional. She presented to ED with pain, with CT AP 05-07-2011 in [facility] system with 20 x 16.7 x 16 cm pelvic mass thought to be uterus, with second 10.3x10.7x8.8 cm mass adjacent, an 11 x 7.5 cm nodal mass encasing right iliac vessels and severe right hydronephrosis. She was seen by [facility] 05-11-11, then went to exploratory laparotomy with supracervical hysterectomy at [facility] by Dr.Wendy Nelly Rout on 05-22-11. At surgery she had a 24 cm uterus extending to bilateral pelvic sidewalls with an ~ 15 cm retroperitoneal mass. Pathology [id number]) showed multifocal leiomyosarcoma spanning 17 cm, involving uterus and right adnexa, + LVSI. Postoperatively she was tachycardic which improved with 2 units PRBCs; she also had CT angio chest done during that hospitalization 05-24-11 which was negative for PE but did show multiple bilateral pulmonary nodules radiographically consistent with metastases. She was seen in follow up by Dr.Brewster at clinic on 05-31-2011, with increased swelling of right thigh and right back pain; venous doppler was negative for clot RLE. She had bilateral ureteral obstruction by 06-12-2011, with creatinine up to 3.76, with bilateral PCNs placed by IR. She had PAC placed by IR. She began gemzar/taxotere 06-25-11, day 1  day 8 q 21 days with neulasta support. She has required PRBC transfusions on several occasions thru this course and had IV iron on 08-06-11. She had new RLE DVT documented 08-17-11. Restaging CT 08-20-11 had fairly stable disease in chest and still significant involvement in pelvis.  Cycle 5 was to be gemzar only due to LE swelling possibly exacerbated by taxotere, however day 1 was held on 09-24-11 due to asymptomatic heart rate 150 - 160. She was evaluated in ED with sinus tachycardia vs SVT vs a flutter with 2:1 block. CT angio chest in ED was negative for PE but did show progression of pulmonary mets. Gemzar/ taxotere was discontinued then and tamoxifen + RT begun in further attempt at palliation.  Patient has finally been more comfortable, on duragesic 100 mcg/72 hrs since 10-24-11 with percocet 10-325 usually every 4 hrs as well as dilaudid after RT. Swelling in LE is improved, probably with RT tho she is also off of taxotere. The ureteral stents are not uncomfortable presently and she does not have symptoms of urinary tract infection, tho she complains of new bladder spasms. Appetite is still poor with some nausea, tho she can drink liquids. She has had no bleeding, presently on coumadin 2.5 mg daily. Bowels are moving. Remainder of 10 point Review of Systems negative.  Objective:  Vital signs in last 24 hours:  BP 121/63  Pulse 97  Temp 98.6 F (37 C) (Oral)  Ht 5\' 5"  (1.651 m)  Wt 247 lb 4.8 oz (112.175 kg)  BMI 41.15 kg/m2  LMP 04/09/2011 This weight is down 17 lbs since June. More easily mobile, including ambulatory today. Looks more comfortable overall. Respirations not labored RA.  HEENT:PERRLA, sclera clear, anicteric and oropharynx clear, no lesions. Poor dentition. No JVD. LymphaticsCervical, supraclavicular, and axillary nodes normal. Resp: clear to auscultation bilaterally, breath sounds somewhat diminished. No wheezes, no use of accessory muscles. Cardio: regular rate and rhythm,  not markedly tachycardic GI: obese, soft, nontender, nothing palpable Extremities: LE swelling much improved bilaterally, now trace - 1+, no cords or tenderness Neuro nonfocal Portacath without erythema or tenderness Bilateral nephrostomy tubes with clean dressings intact. Clear urine in bags bilaterally  Lab Results:  Results for orders placed in visit on 10/30/11  PROTIME-INR      Component Value Range   Protime 22.8 (*) 10.6 - 13.4 Seconds   INR 1.90 (*) 2.00 - 3.50   Lovenox No    CBC WITH DIFFERENTIAL      Component Value Range   WBC 7.5  3.9 - 10.3 10e3/uL   NEUT# 6.3  1.5 - 6.5 10e3/uL   HGB 8.1 (*) 11.6 - 15.9 g/dL   HCT 19.1 (*) 47.8 - 29.5 %   Platelets 253  145 - 400 10e3/uL   MCV 81.0  79.5 - 101.0 fL   MCH 25.6  25.1 - 34.0 pg   MCHC 31.6  31.5 - 36.0 g/dL   RBC 6.21 (*) 3.08 - 6.57 10e6/uL   RDW 19.6 (*) 11.2 - 14.5 %   lymph# 0.5 (*) 0.9 - 3.3 10e3/uL   MONO# 0.6  0.1 - 0.9 10e3/uL   Eosinophils Absolute 0.2  0.0 - 0.5 10e3/uL   Basophils Absolute 0.0  0.0 - 0.1 10e3/uL   NEUT% 83.0 (*) 38.4 - 76.8 %   LYMPH% 7.0 (*) 14.0 - 49.7 %   MONO% 7.6  0.0 - 14.0 %   EOS% 2.3  0.0 - 7.0 %   BASO% 0.1  0.0 - 2.0 %   nRBC 0  0 - 0 %     Studies/Results:  No results found.  Medications: I have reviewed the patient's current medications. She will continue coumadin at 2.5 mg daily, reevaluation at coumadin clinic in 2 weeks. I have given prescription for ditropan 5 mg q 8 hrs prn bladder spasm and rewritten dilaudid 4 mg # 30 and Ativan 0.5 mg # 30.   She will have PAC flushed when she is back at this office for gyn onc visit, and I will see her back in late August or sooner if needed.  Assessment/Plan: 1. Metastatic leiomyosarcoma of uterus: progression in pelvis and lung on taxotere/ gemzar, now on tamoxifen which she is tolerating well. RT in progess, seems to be improving situation. She understands that there are not good options for further chemotherapy. Follow  up as above 2.Bilateral percutaneous nephrostomy tubes 3.PAC in 4.episodes of tachycardia up to 150 - 160: on low dose B blocker without recent recurrence 5.RLE DVT continuing coumadin indefinitely 6.Anemia: multifactorial including chemo, RT, gyn bleeding, nephrostomy tubes, chronic disease. Has required PRBCs several times thru this course. Actually seems less symptomatic today, will follow. 7.NCB, patient's request.   Patient and mother were in agreement with plan as above.   Jannette Cotham P, MD   10/30/2011, 1:30 PM

## 2011-10-31 ENCOUNTER — Ambulatory Visit
Admission: RE | Admit: 2011-10-31 | Discharge: 2011-10-31 | Disposition: A | Discharge status: Home / Self Care | Payer: Medicaid Other | Source: Ambulatory Visit | Attending: Radiation Oncology | Admitting: Radiation Oncology

## 2011-10-31 ENCOUNTER — Ambulatory Visit: Payer: Medicaid Other

## 2011-10-31 ENCOUNTER — Encounter: Payer: Self-pay | Admitting: Radiation Oncology

## 2011-10-31 VITALS — BP 114/67 | HR 92 | Resp 18 | Wt 248.0 lb

## 2011-10-31 DIAGNOSIS — D649 Anemia, unspecified: Secondary | ICD-10-CM

## 2011-10-31 DIAGNOSIS — C549 Malignant neoplasm of corpus uteri, unspecified: Secondary | ICD-10-CM

## 2011-10-31 MED ORDER — CIPROFLOXACIN HCL 500 MG PO TABS: 250.0000 mg | ORAL_TABLET | Freq: Two times a day (BID) | ORAL | Status: AC

## 2011-10-31 NOTE — Progress Notes (Signed)
Spoke to Amy at  [facility] and her Fentanyl 100 mcg has been approved for 1 yr. Auth # [id number] P.  Faxed to [facility] OOP.

## 2011-10-31 NOTE — Progress Notes (Signed)
Patient seen today. She finished her final treatment. The patient complains of a strong odor with respect to her urine and she notes that this has happened twice in recent months and has been associated with a urinary tract infection. She therefore request an antibiotic for this today.  I written her for an antibiotic for this and she knows to contact our office if this does not resolve this issue.  The patient will be seen in one month for ongoing followup.

## 2011-10-31 NOTE — Progress Notes (Signed)
Patient presents to the clinic today accompanied by her neighbor friend for a PUT with Dr. Mitzi Hansen on final treatment day. Patient is alert and oriented to person, place, and time. No distress noted. Steady gait noted. Pleasant affect noted. Patient reports constant low back, pelvic, right leg and abdominal pain 5 on a scale of 0-10 for which she takes dilaudid, percocet, and fentanyl. Patient scheduled to follow up with Dr. Mitzi Hansen again 8/23 at 1130. Patient reports taking phenergan prn for nausea. Patient reports urgency and bladder spasms for which she started taking oxybutin yesterday. Clear yellow urine noted in left ureter bag. Cloudy urine with sediment and odor noted in right ureter bag. Patient reports a thin milky white vaginal discharge. Patient denies diarrhea. Reported all findings to Dr. Mitzi Hansen.

## 2011-11-01 ENCOUNTER — Telehealth: Payer: Self-pay | Admitting: Pharmacist

## 2011-11-01 ENCOUNTER — Telehealth: Payer: Self-pay | Admitting: *Deleted

## 2011-11-01 ENCOUNTER — Ambulatory Visit: Payer: Medicaid Other

## 2011-11-01 NOTE — Telephone Encounter (Addendum)
VERBAL ORDER AND READ BACK TO Jackie JOHNSON,PA- PT. MAY TAKE THE CIPRO. CHECK WITH THE COUMADIN CLINIC TO SEE IF PT. COULD COME TO THIS OFFICE EITHER Monday OR Tuesday FOR A PT/INR. SPOKE WITH PHARMACIST, GINA TUCKER. SHE WILL ARRANGE APPOINTMENT FOR PT/INR AND CONTACT PT.

## 2011-11-02 ENCOUNTER — Ambulatory Visit: Payer: Medicaid Other

## 2011-11-05 ENCOUNTER — Ambulatory Visit: Payer: Medicaid Other

## 2011-11-06 ENCOUNTER — Ambulatory Visit: Payer: Medicaid Other

## 2011-11-06 ENCOUNTER — Ambulatory Visit (HOSPITAL_BASED_OUTPATIENT_CLINIC_OR_DEPARTMENT_OTHER): Payer: Medicaid Other | Admitting: Pharmacist

## 2011-11-06 ENCOUNTER — Ambulatory Visit (HOSPITAL_BASED_OUTPATIENT_CLINIC_OR_DEPARTMENT_OTHER): Payer: Medicaid Other | Admitting: Lab

## 2011-11-06 DIAGNOSIS — I82409 Acute embolism and thrombosis of unspecified deep veins of unspecified lower extremity: Secondary | ICD-10-CM

## 2011-11-06 LAB — POCT INR: INR: 2.1

## 2011-11-06 LAB — PROTIME-INR: Protime: 25.2 Seconds — ABNORMAL HIGH (ref 10.6–13.4)

## 2011-11-06 NOTE — Progress Notes (Signed)
Pt is therapeutic at 2.1 today.  Will change her dose to 2.5 mg daily, instead of a one day 1mg and 2.5 mg all other days.  She has just finished a 7 day course of cipro for bladder infection.  We will recheck her in 7-10 days to make sure she does not need adjustments with finishing her abx course.  RTC on Tue, 11/13/11 at 9:45 for lab and 10 am for cc.  

## 2011-11-06 NOTE — Patient Instructions (Signed)
Pt is therapeutic at 2.1 today.  Will change her dose to 2.5 mg daily, instead of a one day 1mg  and 2.5 mg all other days.  She has just finished a 7 day course of cipro for bladder infection.  We will recheck her in 7-10 days to make sure she does not need adjustments with finishing her abx course.  RTC on Tue, 11/13/11 at 9:45 for lab and 10 am for cc.

## 2011-11-07 ENCOUNTER — Other Ambulatory Visit: Payer: Self-pay

## 2011-11-07 ENCOUNTER — Ambulatory Visit: Payer: Medicaid Other

## 2011-11-07 DIAGNOSIS — C55 Malignant neoplasm of uterus, part unspecified: Secondary | ICD-10-CM

## 2011-11-07 MED ORDER — LORAZEPAM 0.5 MG PO TABS: ORAL_TABLET | ORAL | Status: DC

## 2011-11-07 MED ORDER — HYDROMORPHONE HCL 4 MG PO TABS: 4.0000 mg | ORAL_TABLET | ORAL | Status: DC | PRN

## 2011-11-07 MED ORDER — OXYBUTYNIN CHLORIDE 5 MG PO TABS: 5.0000 mg | ORAL_TABLET | Freq: Three times a day (TID) | ORAL | Status: DC | PRN

## 2011-11-08 ENCOUNTER — Ambulatory Visit: Payer: Medicaid Other

## 2011-11-09 ENCOUNTER — Ambulatory Visit: Payer: Medicaid Other

## 2011-11-12 ENCOUNTER — Telehealth: Payer: Self-pay

## 2011-11-12 ENCOUNTER — Ambulatory Visit: Payer: Medicaid Other

## 2011-11-12 ENCOUNTER — Other Ambulatory Visit: Payer: Self-pay

## 2011-11-12 DIAGNOSIS — C55 Malignant neoplasm of uterus, part unspecified: Secondary | ICD-10-CM

## 2011-11-12 NOTE — Telephone Encounter (Signed)
Jackie Cook has been experiencing some heart burn over the last few weeks.  She is using generic zantac 75 mg at onset of discomfort.  She is hurting right above the umbilicus since last night. An dose of dilaudid 4 mg this am has not helped. Pt. due to change Duragesic patch 100 mcg last evening, however, she took it off last night and was going to put one on some time today.  She did not realize that she was not getting the effect of the narcotic for ~ 12 hours.  She is to put the patch on now.  Take the percocet due now and use a 4 mg dilaudid tab 2 hours after the percocet this evening and night and see what her pain level is in the am at the coumadin clinic.  She will need a prescription for more patches tomorrow. She has been using pepto-bismal   For her loose stools for the last 2-3 weeks.  She has been having loose stools 10-12 times a day in small amounts. Told her that Jackie Loft PA-C said  To hold the pepto-bismal and use the zantac 75 mg bid.  Will check stool for C-diff tomorrow as well as urine from bilat. Nephrotomies since urine with foul odor and has had recent UTI. Afebrile.  Pt. To continue with the Reglan ac and hs for now. Jackie Cook verbalized understanding.

## 2011-11-13 ENCOUNTER — Other Ambulatory Visit (HOSPITAL_BASED_OUTPATIENT_CLINIC_OR_DEPARTMENT_OTHER): Payer: Medicaid Other | Admitting: Lab

## 2011-11-13 ENCOUNTER — Ambulatory Visit: Payer: Medicaid Other

## 2011-11-13 ENCOUNTER — Ambulatory Visit (HOSPITAL_BASED_OUTPATIENT_CLINIC_OR_DEPARTMENT_OTHER): Payer: Medicaid Other | Admitting: Pharmacist

## 2011-11-13 ENCOUNTER — Other Ambulatory Visit: Payer: Self-pay

## 2011-11-13 ENCOUNTER — Telehealth: Payer: Self-pay

## 2011-11-13 DIAGNOSIS — Z5181 Encounter for therapeutic drug level monitoring: Secondary | ICD-10-CM

## 2011-11-13 DIAGNOSIS — I82409 Acute embolism and thrombosis of unspecified deep veins of unspecified lower extremity: Secondary | ICD-10-CM

## 2011-11-13 DIAGNOSIS — C55 Malignant neoplasm of uterus, part unspecified: Secondary | ICD-10-CM

## 2011-11-13 DIAGNOSIS — C801 Malignant (primary) neoplasm, unspecified: Secondary | ICD-10-CM

## 2011-11-13 LAB — URINALYSIS, MICROSCOPIC - CHCC
Glucose: NEGATIVE g/dL
Ketones: NEGATIVE mg/dL
Nitrite: POSITIVE
Nitrite: POSITIVE
Protein: 30 mg/dL
Protein: 300 mg/dL
Specific Gravity, Urine: 1.005 (ref 1.003–1.035)
Specific Gravity, Urine: 1.01 (ref 1.003–1.035)
pH: 6 (ref 4.6–8.0)

## 2011-11-13 LAB — CBC WITH DIFFERENTIAL/PLATELET
BASO%: 0.2 % (ref 0.0–2.0)
Basophils Absolute: 0 10*3/uL (ref 0.0–0.1)
EOS%: 1.5 % (ref 0.0–7.0)
Eosinophils Absolute: 0.1 10*3/uL (ref 0.0–0.5)
LYMPH%: 4.2 % — ABNORMAL LOW (ref 14.0–49.7)
MCH: 25.9 pg (ref 25.1–34.0)
MCV: 79.2 fL — ABNORMAL LOW (ref 79.5–101.0)
MONO%: 12.9 % (ref 0.0–14.0)
NEUT#: 3.5 10*3/uL (ref 1.5–6.5)
Platelets: 293 10*3/uL (ref 145–400)
RBC: 3.59 10*6/uL — ABNORMAL LOW (ref 3.70–5.45)

## 2011-11-13 LAB — PROTIME-INR: Protime: 25.2 Seconds — ABNORMAL HIGH (ref 10.6–13.4)

## 2011-11-13 LAB — POCT INR: INR: 2.1

## 2011-11-13 LAB — COMPREHENSIVE METABOLIC PANEL
ALT: 11 U/L (ref 0–35)
AST: 15 U/L (ref 0–37)
Albumin: 2.4 g/dL — ABNORMAL LOW (ref 3.5–5.2)
CO2: 26 mEq/L (ref 19–32)
Calcium: 8.9 mg/dL (ref 8.4–10.5)
Chloride: 102 mEq/L (ref 96–112)
Creatinine, Ser: 0.76 mg/dL (ref 0.50–1.10)
Potassium: 3.1 mEq/L — ABNORMAL LOW (ref 3.5–5.3)
Total Protein: 6.1 g/dL (ref 6.0–8.3)

## 2011-11-13 MED ORDER — FENTANYL 100 MCG/HR TD PT72: 1.0000 | MEDICATED_PATCH | TRANSDERMAL | Status: DC

## 2011-11-13 MED ORDER — FLUCONAZOLE 100 MG PO TABS: 100.0000 mg | ORAL_TABLET | Freq: Every day | ORAL | Status: AC

## 2011-11-13 MED ORDER — POTASSIUM CHLORIDE CRYS ER 20 MEQ PO TBCR: EXTENDED_RELEASE_TABLET | ORAL | Status: DC

## 2011-11-13 NOTE — Progress Notes (Signed)
Faxed recent notes and labs to [employer] PA. With Dr. Retta Diones- urology for appt. there 11-14-11 at 1315.

## 2011-11-13 NOTE — Patient Instructions (Signed)
Will continue the 2.5mg  daily.  RTC on 12/10/11 same day she has a F/U with Dr. Darrold Span.

## 2011-11-13 NOTE — Progress Notes (Unsigned)
Ms. Strike here for coumadin clinic.  She stated that her heartburn is gone with using the Zantac 75mg  bid. Her pain level in her abdomen is a 4/10.  She still has some time for the patch to get into her system.  She will use the percocet and dilaudid prn.  A prescription for Duragesic patches 100 mcg  # 10 given to patient.  She knows to call if pain not controlled and an increase use of the breakthrough meds without relief. Onset of 2 days of vaginal burning , itching, and white discharge.  Dr. Darrold Span  ordered diflucan which was sent to [facility]. Ditropan not helping with bladder spasms.  Will make referral back to urologist per Dr. Darrold Span. Diarrhea has slowed down in the last 24 hrs.  She has had 5 loose stools in the last 24 hrs.  Gave pt. Container for a stool specimen for C-diff.  Reviewed instructions and where to return specimen depending on when she brings specimen. KCL low today at 3.1.  Dr. Darrold Span ordered a 7 day course of kcl daily with taking 2 tabs today. Prescription sent to [facility]. Pharmacy.

## 2011-11-13 NOTE — Telephone Encounter (Signed)
Spoke with Jackie Cook and told her that an appt. was set up for her with Dr. Lenoria Chime PA. Christiane Ha  Tomorrow at [phone].  She needs to arrive at 1300.  This appt. Is to follow up on the bladder spasms that she has been experiencing.  The ditropan has not been effective that Dr. Darrold Span prescribed.

## 2011-11-13 NOTE — Progress Notes (Signed)
Pt has maintained her therapeutic INR after course of cipro.  Will continue the 2.5mg  daily.  RTC on 12/10/11 same day she has a F/U with Dr. Darrold Span.

## 2011-11-14 ENCOUNTER — Telehealth: Payer: Self-pay

## 2011-11-14 ENCOUNTER — Ambulatory Visit: Payer: Medicaid Other

## 2011-11-14 LAB — CLOSTRIDIUM DIFFICILE EIA

## 2011-11-14 NOTE — Telephone Encounter (Signed)
Told Jackie Cook that the stoo  sample was negative for C-diff per Dr. Darrold Span. Jackie Cook stated that she was begun on cipro today by Dr. Retta Diones.  He gave her 2 samples of 500 mg tabs and was to take a half of a tab twice today and tomorrow and see what the urine culture shows on Friday.

## 2011-11-15 ENCOUNTER — Ambulatory Visit: Payer: Medicaid Other

## 2011-11-16 ENCOUNTER — Ambulatory Visit: Payer: Medicaid Other

## 2011-11-16 ENCOUNTER — Telehealth: Payer: Self-pay

## 2011-11-16 LAB — URINE CULTURE

## 2011-11-16 NOTE — Telephone Encounter (Signed)
Spoke with Victorino Dike and told her that the urine culture came back from 11-13-11 for left nephrostomy tube that was positive for e-coli.  Faxed results to jennifer to have Dr. Retta Diones review and treat since he began cipro on 11-14-11 per patient for ua results done at their office that day

## 2011-11-17 NOTE — Progress Notes (Signed)
  Radiation Oncology         [phone] ________________________________  Name: Jackie Cook MRN: [id number]  Date: 10/31/2011  DOB: May 27, 1960  End of Treatment Note  Diagnosis:   Metastatic leiomyosarcoma     Indication for treatment:  Palliative       Radiation treatment dates:   10/16/2011 through 10/31/2011  Site/dose:   The patient was treated to the large tumor within the pelvis/lower abdomen. The patient was treated with a 4 field technique to a dose of 30 gray in 10 fractions at 3 gray per fraction.  Narrative: The patient tolerated radiation treatment relatively well.   She did not have any significant GI complaints including nausea or diarrhea. The patient did have a urinary tract infection towards the end of treatment. This was treated with antibiotics.  Plan: The patient has completed radiation treatment. The patient will return to radiation oncology clinic for routine followup in one month. I advised the patient to call or return sooner if they have any questions or concerns related to their recovery or treatment. ________________________________  Radene Gunning, M.D., Ph.D.

## 2011-11-19 ENCOUNTER — Ambulatory Visit: Payer: Medicaid Other

## 2011-11-20 ENCOUNTER — Ambulatory Visit: Payer: Medicaid Other

## 2011-11-21 ENCOUNTER — Ambulatory Visit (HOSPITAL_COMMUNITY)
Admission: RE | Admit: 2011-11-21 | Discharge: 2011-11-21 | Disposition: A | Discharge status: Home / Self Care | Payer: Medicaid Other | Source: Ambulatory Visit | Attending: Oncology | Admitting: Oncology

## 2011-11-21 ENCOUNTER — Ambulatory Visit: Payer: Medicaid Other

## 2011-11-21 ENCOUNTER — Other Ambulatory Visit: Payer: Self-pay | Admitting: Oncology

## 2011-11-21 ENCOUNTER — Other Ambulatory Visit: Payer: Self-pay

## 2011-11-21 DIAGNOSIS — N135 Crossing vessel and stricture of ureter without hydronephrosis: Secondary | ICD-10-CM

## 2011-11-21 DIAGNOSIS — Z436 Encounter for attention to other artificial openings of urinary tract: Secondary | ICD-10-CM | POA: Insufficient documentation

## 2011-11-21 DIAGNOSIS — C55 Malignant neoplasm of uterus, part unspecified: Secondary | ICD-10-CM

## 2011-11-21 DIAGNOSIS — R1900 Intra-abdominal and pelvic swelling, mass and lump, unspecified site: Secondary | ICD-10-CM

## 2011-11-21 MED ORDER — LORAZEPAM 0.5 MG PO TABS: ORAL_TABLET | ORAL | Status: DC

## 2011-11-21 MED ORDER — IOHEXOL 300 MG/ML  SOLN: 10.0000 mL | Freq: Once | INTRAMUSCULAR | Status: AC | PRN

## 2011-11-21 NOTE — Procedures (Signed)
Bilat perc neph change 27f No complication No blood loss. See complete dictation in [facility].

## 2011-11-21 NOTE — Telephone Encounter (Signed)
Told Ms. Kantor that the Ativan prescription was called in to [facility] pharmacy.  She can pick up percocet prescription tomorrow from [facility] with Dr. Darcus Pester.

## 2011-11-22 ENCOUNTER — Encounter: Payer: Self-pay | Admitting: Gynecologic Oncology

## 2011-11-22 ENCOUNTER — Ambulatory Visit: Payer: Medicaid Other | Attending: Gynecologic Oncology | Admitting: Gynecologic Oncology

## 2011-11-22 ENCOUNTER — Ambulatory Visit (HOSPITAL_BASED_OUTPATIENT_CLINIC_OR_DEPARTMENT_OTHER): Payer: Medicaid Other

## 2011-11-22 ENCOUNTER — Ambulatory Visit: Payer: Medicaid Other

## 2011-11-22 VITALS — BP 108/59 | HR 90 | Temp 98.6°F

## 2011-11-22 VITALS — BP 100/54 | HR 78 | Temp 98.4°F | Resp 20 | Ht 65.83 in | Wt 233.9 lb

## 2011-11-22 DIAGNOSIS — Z90710 Acquired absence of both cervix and uterus: Secondary | ICD-10-CM | POA: Insufficient documentation

## 2011-11-22 DIAGNOSIS — C55 Malignant neoplasm of uterus, part unspecified: Secondary | ICD-10-CM | POA: Insufficient documentation

## 2011-11-22 DIAGNOSIS — Z452 Encounter for adjustment and management of vascular access device: Secondary | ICD-10-CM

## 2011-11-22 MED ORDER — HEPARIN SOD (PORK) LOCK FLUSH 100 UNIT/ML IV SOLN
500.0000 [IU] | Freq: Once | INTRAVENOUS | Status: AC
  Administered 2011-11-22: 500 [IU] via INTRAVENOUS
  Filled 2011-11-22: qty 5

## 2011-11-22 MED ORDER — SODIUM CHLORIDE 0.9 % IJ SOLN
10.0000 mL | INTRAMUSCULAR | Status: DC | PRN
  Administered 2011-11-22: 10 mL via INTRAVENOUS
  Filled 2011-11-22: qty 10

## 2011-11-22 NOTE — Progress Notes (Signed)
Office Visit  Note: Gyn-Onc   [name] 51 y.o. female  Chief Complaint  Patient presents with  . Uterine cancer    follow up     HPI:  51 year old white female presented with an abdominal pelvic mass. Subsequently she's had a CT scan which shows a 20 x 16 x 16 cm abdominal pelvic mass. This is thought to be a large uterus with a central area of low attenuation consistent with necrosis. There is some evidence of a pedunculated mass measuring 10 x 8.8 x 10.7 cm posterior to the main lesion along the right iliac vessels is a large nodal mass measuring 11 x 7.5 cm which encased the iliac vessels and ureter creating a right hydronephrosis which is severe. The liver spleen and adrenal glands and left kidney are otherwise unremarkable there is no evidence of ascites. The patient denies any past gynecologic history. Chest x-ray was notable for 2 metastatic lesions.   On February 5 she underwent an exploratory laparotomy.  Operative findings were notable for a 24 cm uterus extending to bilateral pelvic sidewalls. The cervix was deviated towards the right. A proximately 15 cm retroperitoneal mass was noted extending from the level of the cervix to the bifurcation of the right iliac vessels. Tumor was also noted to be extruding from the right cornua and adherent to the retroperitoneal mass.  She underwent supracervical hysterectomy  bilateral salpingo-oophorectomy. 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 - TUMOR INVOLVES UTERUS AND RIGHT ADNEXA - LYMPHOVASCULAR INVASION IS IDENTIFIED. - SEE ONCOLOGY TABLE BELOW. Microscopic Comment UTERUS Specimen: Uterus and bilateral adnexa. Procedure: Supracervical hysterectomy and bilateral salpingo-oophorectomy. Lymph node sampling performed: No. Specimen integrity: Intact. Maximum tumor size (gross measurement): At least 17.0 cm. Histologic type:  Leiomyosarcoma. Grade: High grade. Myometrial invasion: N/A. Cervical stromal involvement: Not identified. Extent of involvement of other organs: Involves the right adnexa. Lymph vascular invasion: Present, scattered foci. Peritoneal washings: N/A. Lymph nodes: Number examined 0; number positive N/A. TNM code: pT2a, pNX, at least. FIGO Stage (based on pathologic findings, needs clinical correlation): IIA, at least. Comments: There are multiple foci of leiomyosarcoma; the largest grossly spans 17.0 cm and is present in the myometrium. In addition to this largest nodule, there are two other grossly identified sarcomas in the right para adnexa tissue, spanning 6.0 and 4.5 cm. The myometrium also contains benign leiomyomata and the endometrium contains two benign endometrioid polyps.  The patient has received Gemzar and Taxotere with disease progression and subsequently required  bilateral nephrostomy catheters and was diagnosed with a right deep venous thrombosis for which she is on Coumadin therapy.   INTERVAL HISTORY: [name] has received palliative radiotherapy 7/02-7/17/2013.  Reports that it is much easire to walk and that the RLE edema has resolved.  Reports recent left upper tract  UTI for which she is receiving Cipro.  Allergies  Allergen Reactions  . Aspirin     GI upset  . Morphine And Related Itching  . Penicillins Itching    Past Medical History  Diagnosis Date  . Anemia     Iron deficiency/prbc's  . H/O hiatal hernia   . H/O menorrhagia   . DDD (degenerative disc disease)   . 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 Metastatic cancer     liver/lung/spleen/mediastinal/hilar lymph nodes  . History of chemotherapy     taxotere/gemzar  . DVT (deep  venous thrombosis)     RLE on coumadin  . UTI (lower urinary tract infection)   . Obesity   . DNR (do not resuscitate)     per patient request  . History of  chemotherapy     taxotere/gemzar  . Blood transfusion 2001, 09/28/11    2 units PRBCs 09/28/11 hgb 8.6    Past Surgical History  Procedure Date  . Total abdominal hysterectomy w/ bilateral salpingoophorectomy 05/22/11  . Laparotomy 05/22/2011    Procedure: EXPLORATORY LAPAROTOMY;  Surgeon: Laurette Schimke, MD PHD;  Location: [facility];  Service: Gynecology;  Laterality: N/A;  . Abdominal hysterectomy 05/22/2011    Procedure: HYSTERECTOMY ABDOMINAL;  Surgeon: Laurette Schimke, MD PHD;  Location: [facility];  Service: Gynecology;  Laterality: N/A;  with Resection of Pelvic Mass    . Salpingoophorectomy 05/22/2011    Procedure: SALPINGO OOPHERECTOMY;  Surgeon: Laurette Schimke, MD PHD;  Location: [facility];  Service: Gynecology;  Laterality: Bilateral;  . Portacath placement 06/18/2011    Dr. Mervyn Skeeters. Hoss, 8 Fr, Rt. port w/ tip in SVC  . B/l nephrostomy tubes 06/13/11     History   Social History  . Marital Status: Single    Spouse Name: N/A    Number of Children: 0  . Years of Education: 12   Occupational History  . Unemployed   . Has done dog sitting in the past.    Social History Main Topics  . Smoking status: Former Smoker -- 1.0 packs/day for 34 years    Quit date: 05/22/2011  . Smokeless tobacco: Never Used  . Alcohol Use: No  . Drug Use: No  . Sexually Active: No   Other Topics Concern  . Not on file   Social History Narrative   Single.  Lives with a friend.  Independent of ADLs and ambulation.    Family History  Problem Relation Age of Onset  . Breast cancer Maternal Aunt   . Heart attack Other   . Lung cancer Father     REVIEW OF SYSTEMS Constitutional  Feels well pain with cough or deep breathing Cardiovascular  No chest pain, shortness of breath, Pulmonary  No cough or wheeze.  Gastro Intestinal  No nausea, vomitting, or diarrhoea. No bright red blood per rectum, no abdominal pain, change in bowel movement, or constipation.  Genito Urinary  No frequency, urgency, dysuria,  reports yellow vaginal discharge Musculo Skeletal  Bilateral flank pain. Neurologic  No weakness, numbness, able to walk without a cane.  The RLE edema is resolving. Psychology  No depression, anxiety, insomnia.     Vitals: Blood pressure 100/54, pulse 78, temperature 98.4 F (36.9 C), temperature source Oral, resp. rate 20, height 5' 5.83" (1.672 m), weight 233 lb 14.4 oz (106.096 kg), last menstrual period 04/09/2011.  Physical Exam: A pleasant but anxious white female in significant distress with multiple episodes of crying. HEENT is negative Chest:  CTA Cardiac:  RRR Lymph node survey: There is no supraclavicular or inguinal adenopathy. Abdomen:  soft nontender.  Midline incision healing well without any evidence of wound separation or discharge Back: Bilateral nephrostomy catheters appreciated bilateral  CVA tenderness; Extremities: 2-3+ bilateral lower extremity edema; 2+ Bilateral calves tender to palpation  .Assessment/Plan: Stage IV uterine leiomyosarcoma. Patient has significant residual disease in the right retroperitoneal area with obstruction of bilateral ureters. There was disease progression on chemotherapy and she has since received palliative radiotherapy and feels better that before diagnosis.  [name] and her mother are  aware that there will be disease progression.  They are aware that hospice is available at their request and that the goal is for quality of life.   Prescription for Percocet was given. F/U with Dr. Darrold Span 12/10/2011 F/U with Dr. Juliene Pina 12/07/2011 F/U GYN Onc in 3months.      Laurette Schimke, MD 11/22/2011, 9:55 AM

## 2011-11-22 NOTE — Patient Instructions (Signed)
Call MD with any problems 

## 2011-11-22 NOTE — Patient Instructions (Addendum)
Our goal for your care is the quality of your  life.   Prescription for Percocet was given. F/U with Dr. Darrold Span 12/10/2011 F/U with Dr. Juliene Pina 12/07/2011 F/U GYN Onc in 3months.   Thank you very much Ms. MAGEN SURIANO for allowing me to provide care for you today.  I appreciate your confidence in choosing our Gynecologic Oncology team.  If you have any questions about your visit today please call our office and we will get back to you as soon as possible.  Maryclare Labrador. Jerame Hedding MD., PhD Gynecologic Oncology

## 2011-11-23 ENCOUNTER — Other Ambulatory Visit: Payer: Self-pay

## 2011-11-23 ENCOUNTER — Ambulatory Visit: Payer: Medicaid Other

## 2011-11-23 DIAGNOSIS — C55 Malignant neoplasm of uterus, part unspecified: Secondary | ICD-10-CM

## 2011-11-23 MED ORDER — PERCOCET 10-325 MG PO TABS: 1.0000 | ORAL_TABLET | Freq: Four times a day (QID) | ORAL | Status: DC | PRN

## 2011-11-26 ENCOUNTER — Ambulatory Visit: Payer: Medicaid Other

## 2011-11-27 ENCOUNTER — Ambulatory Visit: Payer: Medicaid Other

## 2011-12-04 ENCOUNTER — Other Ambulatory Visit: Payer: Self-pay

## 2011-12-04 DIAGNOSIS — C801 Malignant (primary) neoplasm, unspecified: Secondary | ICD-10-CM

## 2011-12-04 DIAGNOSIS — C55 Malignant neoplasm of uterus, part unspecified: Secondary | ICD-10-CM

## 2011-12-04 MED ORDER — HYDROMORPHONE HCL 4 MG PO TABS: 4.0000 mg | ORAL_TABLET | ORAL | Status: DC | PRN

## 2011-12-04 MED ORDER — FENTANYL 100 MCG/HR TD PT72: 1.0000 | MEDICATED_PATCH | TRANSDERMAL | Status: AC

## 2011-12-04 NOTE — Telephone Encounter (Signed)
Prescription up front with injection nurse in  Prescription book.  Pt. To pick up prescription today or tomorrow.

## 2011-12-05 ENCOUNTER — Other Ambulatory Visit: Payer: Self-pay | Admitting: *Deleted

## 2011-12-07 ENCOUNTER — Encounter: Payer: Self-pay | Admitting: Radiation Oncology

## 2011-12-07 ENCOUNTER — Ambulatory Visit
Admission: RE | Admit: 2011-12-07 | Discharge: 2011-12-07 | Disposition: A | Discharge status: Home / Self Care | Payer: Medicaid Other | Source: Ambulatory Visit | Attending: Radiation Oncology | Admitting: Radiation Oncology

## 2011-12-07 DIAGNOSIS — C549 Malignant neoplasm of corpus uteri, unspecified: Secondary | ICD-10-CM

## 2011-12-07 NOTE — Progress Notes (Signed)
Radiation Oncology         [phone] ________________________________  Name: Jackie Cook MRN: [id number]  Date: 12/07/2011  DOB: 53-Jul-1962  Follow-Up Visit Note  CC: No primary provider on file.  Reece Packer, MD  Diagnosis:   Metastatic leiomyosarcoma  Interval Since Last Radiation:  One month   Narrative:  The patient returns today for routine follow-up.  The patient describes some pressure in the pelvis. This has increased over the last couple of weeks she feels. She does have urostomy tubes and is not producing urine on her own. The patient for pain is taking fentanyl as well as oxycodone and she states hydromorphone for breakthrough pain. This appears to be well-controlled at this point. The patient has had a little bit of shortness of breath recently. Her O2 sats were good in our clinic at 98% on room air.                              ALLERGIES:  is allergic to aspirin; morphine and related; and penicillins.  Meds: Current Outpatient Prescriptions  Medication Sig Dispense Refill  . diphenhydrAMINE (BENADRYL) 25 mg capsule Take 25 mg by mouth every 6 (six) hours as needed. For itching      . docusate sodium (COLACE) 100 MG capsule Take 200 mg by mouth daily.       . fentaNYL (DURAGESIC - DOSED MCG/HR) 100 MCG/HR Place 1 patch (100 mcg total) onto the skin every 3 (three) days.  10 patch  0  . ferrous fumarate (HEMOCYTE - 106 MG FE) 325 (106 FE) MG TABS Take 1 tablet (106 mg of iron total) by mouth 2 (two) times daily.  60 each  2  . HYDROmorphone (DILAUDID) 4 MG tablet Take 1 tablet (4 mg total) by mouth every 4 (four) hours as needed for pain.  30 tablet  0  . LORazepam (ATIVAN) 0.5 MG tablet Take 1 every 4- 6h prn nausea  30 tablet  0  . magnesium hydroxide (PHILLIPS MILK OF MAGNESIA) 400 MG/5ML suspension Take 5 mLs by mouth daily as needed.      . metoCLOPramide (REGLAN) 10 MG tablet Take 1 tablet before meals and at bedtime = 4 per day to help stomach empty and  bowels move  60 tablet  1  . metoprolol succinate (TOPROL-XL) 25 MG 24 hr tablet Take 1 tablet (25 mg total) by mouth daily. For rapid heart rate.  Begin with 1/2 tab daily  30 tablet  0  . Multiple Vitamins-Minerals (MULTIVITAMIN PO) Take 1 tablet by mouth daily.      Marland Kitchen PERCOCET 10-325 MG per tablet Take 1 tablet by mouth every 6 (six) hours as needed for pain.  100 tablet  0  . ranitidine (ZANTAC) 75 MG tablet Take 75 mg by mouth 2 (two) times daily.      . tamoxifen (NOLVADEX) 20 MG tablet Take 1 tablet (20 mg total) by mouth daily.  30 tablet  2  . warfarin (COUMADIN) 5 MG tablet 1 tablet by mouth daily between 4 pm and 6 pm, or as directed  50 tablet  1  . oxybutynin (DITROPAN) 5 MG tablet Take 1 tablet (5 mg total) by mouth every 8 (eight) hours as needed. For bladder spasm  30 tablet  1  . polyethylene glycol powder (MIRALAX) powder Take 17 g by mouth daily.  255 g  1  . potassium chloride SA (  K-DUR,KLOR-CON) 20 MEQ tablet Take 2 tabs today then daily until finished  8 tablet  0  . promethazine (PHENERGAN) 25 MG tablet Take 1 tablet (25 mg total) by mouth every 6 (six) hours as needed for nausea.  30 tablet  0    Physical Findings: The patient is in no acute distress. Patient is alert and oriented.  vitals were not taken for this visit..   General: Well-developed, in no acute distress; the patient was unable to get up on the exam table despite some assistance. The exam therefore was performed in a standard hospital chair HEENT: Normocephalic, atraumatic Cardiovascular: Regular rate and rhythm Respiratory: Clear to auscultation bilaterally GI: Soft, fullness present diffusely, nontender    Lab Findings: Lab Results  Component Value Date   WBC 4.3 11/13/2011   HGB 9.3* 11/13/2011   HCT 28.5* 11/13/2011   MCV 79.2* 11/13/2011   PLT 293 11/13/2011     Radiographic Findings: Ir Nephrostomy Tube Change  11/21/2011  *RADIOLOGY REPORT*  Clinical Data:Uterine leiomyosarcoma with  bilateral ureteral obstruction and long-term indwelling percutaneous nephrostomy catheters for decompression.  BILATERAL PERCUTANEOUS NEPHROSTOMY CATHETER EXCHANGE UNDER FLUOROSCOPY  Technique and findings:  The nephrostomy tubes and surrounding skin were prepped with Betadine, draped in usual sterile fashion.  A small amount of contrast was injected through the right nephrostomy catheter to opacify the renal collecting system.  The catheter was cut and exchanged over a 0.035" angiographic wire for a new 10-French pigtail catheter, formed centrally within the collecting system under fluoroscopy.  Contrast injection confirms appropriate positioning.  In a similar fashion, the left nephrostomy catheter was injected, cut, and exchanged for a new 10-French pigtail catheter, formed centrally within the left renal collecting system.  Injection confirms appropriate positioning and patency.  Both catheters were secured externally with O-Prolene and Statlock devices.  The patient tolerated the procedure well, with no immediate complication.  IMPRESSION 1.  Technically successful exchange of bilateral nephrostomy catheters under fluoroscopy  Original Report Authenticated By: Osa Craver, M.D.   Ir Nephrostomy Tube Change  11/21/2011  *RADIOLOGY REPORT*  Clinical Data:Uterine leiomyosarcoma with bilateral ureteral obstruction and long-term indwelling percutaneous nephrostomy catheters for decompression.  BILATERAL PERCUTANEOUS NEPHROSTOMY CATHETER EXCHANGE UNDER FLUOROSCOPY  Technique and findings:  The nephrostomy tubes and surrounding skin were prepped with Betadine, draped in usual sterile fashion.  A small amount of contrast was injected through the right nephrostomy catheter to opacify the renal collecting system.  The catheter was cut and exchanged over a 0.035" angiographic wire for a new 10-French pigtail catheter, formed centrally within the collecting system under fluoroscopy.  Contrast injection confirms  appropriate positioning.  In a similar fashion, the left nephrostomy catheter was injected, cut, and exchanged for a new 10-French pigtail catheter, formed centrally within the left renal collecting system.  Injection confirms appropriate positioning and patency.  Both catheters were secured externally with O-Prolene and Statlock devices.  The patient tolerated the procedure well, with no immediate complication.  IMPRESSION 1.  Technically successful exchange of bilateral nephrostomy catheters under fluoroscopy  Original Report Authenticated By: Osa Craver, M.D.    Impression:    51 year old female with metastatic leiomyosarcoma. The patient has significant disease in terms of both bulky disease and extensive metastases. Her prognosis therefore remains poor. No significant apparent acute toxicity from her radiation treatment which she tolerated well.  Plan:  She will return to our clinic on a when necessary basis. I would be more than happy to  see her in the future if there are areas of concern for which radiotherapy may be helpful. As noted above, the patient's prognosis is poor and at some point aggressive management will yield declining results. The patient is continuing on tamoxifen and sees Dr. Darrold Span next week.   Radene Gunning, M.D., Ph.D.

## 2011-12-07 NOTE — Progress Notes (Signed)
Patient, alert,oriented x3,  Follow up rad txs 10/16/11- 7/17/213 metastatic leiomyosarcoma Patient wearing 100 mcg fentanyl patch right forearm, feels she is getting another infection, has b/l nephrostomy tubes that were changed 11/21/11, still cannot void urine on her own states patient,  Feels a whole lot of pressure in peri area lower abdomen, , taking tamoxifen 20 mg daily, pain   Little took her oxycodone this am at 1000 am, was short of breath after walking from lobby to here rr=24, 98% room air sats, patient breathing has returned to normal after a few minutes. meds updated, 11:50 AM

## 2011-12-10 ENCOUNTER — Other Ambulatory Visit (HOSPITAL_BASED_OUTPATIENT_CLINIC_OR_DEPARTMENT_OTHER): Payer: Medicaid Other | Admitting: Lab

## 2011-12-10 ENCOUNTER — Telehealth: Payer: Self-pay | Admitting: Oncology

## 2011-12-10 ENCOUNTER — Ambulatory Visit: Payer: Medicaid Other | Admitting: Pharmacist

## 2011-12-10 ENCOUNTER — Other Ambulatory Visit: Payer: Self-pay | Admitting: *Deleted

## 2011-12-10 ENCOUNTER — Ambulatory Visit (HOSPITAL_BASED_OUTPATIENT_CLINIC_OR_DEPARTMENT_OTHER): Payer: Medicaid Other | Admitting: Oncology

## 2011-12-10 ENCOUNTER — Ambulatory Visit (HOSPITAL_BASED_OUTPATIENT_CLINIC_OR_DEPARTMENT_OTHER): Payer: Medicaid Other

## 2011-12-10 ENCOUNTER — Encounter (HOSPITAL_COMMUNITY)
Admission: RE | Admit: 2011-12-10 | Discharge: 2011-12-10 | Disposition: A | Discharge status: Home / Self Care | Payer: Medicaid Other | Source: Ambulatory Visit | Attending: Oncology | Admitting: Oncology

## 2011-12-10 ENCOUNTER — Encounter: Payer: Self-pay | Admitting: Oncology

## 2011-12-10 VITALS — BP 104/63 | HR 91 | Temp 97.3°F | Resp 20 | Ht 65.5 in | Wt 241.1 lb

## 2011-12-10 VITALS — BP 113/63 | HR 88 | Temp 98.3°F | Resp 20

## 2011-12-10 DIAGNOSIS — D649 Anemia, unspecified: Secondary | ICD-10-CM

## 2011-12-10 DIAGNOSIS — C55 Malignant neoplasm of uterus, part unspecified: Secondary | ICD-10-CM

## 2011-12-10 DIAGNOSIS — Z86718 Personal history of other venous thrombosis and embolism: Secondary | ICD-10-CM

## 2011-12-10 DIAGNOSIS — C7800 Secondary malignant neoplasm of unspecified lung: Secondary | ICD-10-CM

## 2011-12-10 DIAGNOSIS — I82409 Acute embolism and thrombosis of unspecified deep veins of unspecified lower extremity: Secondary | ICD-10-CM

## 2011-12-10 DIAGNOSIS — Z7901 Long term (current) use of anticoagulants: Secondary | ICD-10-CM

## 2011-12-10 LAB — CBC & DIFF AND RETIC
BASO%: 0.1 % (ref 0.0–2.0)
Eosinophils Absolute: 0.1 10*3/uL (ref 0.0–0.5)
LYMPH%: 6.7 % — ABNORMAL LOW (ref 14.0–49.7)
MCHC: 31.8 g/dL (ref 31.5–36.0)
MONO#: 0.9 10*3/uL (ref 0.1–0.9)
MONO%: 5.7 % (ref 0.0–14.0)
NEUT#: 13.4 10*3/uL — ABNORMAL HIGH (ref 1.5–6.5)
Platelets: 368 10*3/uL (ref 145–400)
RBC: 3.34 10*6/uL — ABNORMAL LOW (ref 3.70–5.45)
RDW: 19 % — ABNORMAL HIGH (ref 11.2–14.5)
Retic %: 2.09 % (ref 0.70–2.10)
Retic Ct Abs: 69.81 10*3/uL (ref 33.70–90.70)
WBC: 15.4 10*3/uL — ABNORMAL HIGH (ref 3.9–10.3)

## 2011-12-10 LAB — COMPREHENSIVE METABOLIC PANEL (CC13)
Albumin: 2.1 g/dL — ABNORMAL LOW (ref 3.5–5.0)
BUN: 8 mg/dL (ref 7.0–26.0)
Chloride: 98 mEq/L (ref 98–107)
Creatinine: 0.7 mg/dL (ref 0.6–1.1)
Glucose: 88 mg/dl (ref 70–99)
Potassium: 3.8 mEq/L (ref 3.5–5.1)

## 2011-12-10 LAB — CORRECTED CALCIUM (CC13): Calcium, Corrected: 10.3 mg/dL (ref 8.4–10.4)

## 2011-12-10 LAB — PROTIME-INR: Protime: 28.8 Seconds — ABNORMAL HIGH (ref 10.6–13.4)

## 2011-12-10 MED ORDER — TAMOXIFEN CITRATE 20 MG PO TABS: 20.0000 mg | ORAL_TABLET | Freq: Every day | ORAL | Status: AC

## 2011-12-10 MED ORDER — LORAZEPAM 0.5 MG PO TABS: ORAL_TABLET | ORAL | Status: AC

## 2011-12-10 MED ORDER — HEPARIN SOD (PORK) LOCK FLUSH 100 UNIT/ML IV SOLN
500.0000 [IU] | Freq: Every day | INTRAVENOUS | Status: AC | PRN
  Administered 2011-12-10: 500 [IU]
  Filled 2011-12-10: qty 5

## 2011-12-10 MED ORDER — SODIUM CHLORIDE 0.9 % IJ SOLN
10.0000 mL | INTRAMUSCULAR | Status: AC | PRN
  Administered 2011-12-10: 10 mL
  Filled 2011-12-10: qty 10

## 2011-12-10 MED ORDER — SODIUM CHLORIDE 0.9 % IV SOLN
250.0000 mL | Freq: Once | INTRAVENOUS | Status: AC
  Administered 2011-12-10: 250 mL via INTRAVENOUS

## 2011-12-10 MED ORDER — ACETAMINOPHEN 325 MG PO TABS
650.0000 mg | ORAL_TABLET | Freq: Once | ORAL | Status: AC
  Administered 2011-12-10: 650 mg via ORAL

## 2011-12-10 MED ORDER — FERROUS FUMARATE 325 (106 FE) MG PO TABS: 1.0000 | ORAL_TABLET | Freq: Two times a day (BID) | ORAL | Status: AC

## 2011-12-10 MED ORDER — HYDROMORPHONE HCL 4 MG PO TABS: 4.0000 mg | ORAL_TABLET | ORAL | Status: AC | PRN

## 2011-12-10 MED ORDER — DIPHENHYDRAMINE HCL 25 MG PO CAPS
25.0000 mg | ORAL_CAPSULE | Freq: Once | ORAL | Status: AC
  Administered 2011-12-10: 25 mg via ORAL

## 2011-12-10 MED ORDER — METOPROLOL SUCCINATE ER 25 MG PO TB24: 25.0000 mg | ORAL_TABLET | Freq: Every day | ORAL | Status: DC

## 2011-12-10 NOTE — Patient Instructions (Addendum)
If no urinary retention in bladder bu [place], will dry medication for bladder spasm

## 2011-12-10 NOTE — Progress Notes (Signed)
OFFICE PROGRESS NOTE   12/10/2011   Physicians:W.Brewster, Interventional Radiology, S.Dahlstedt, J.Moody   INTERVAL HISTORY:  Patient is seen, together with mother, in continuing attention to her metastatic leiomyosarcoma of uterus, now on tamoxifen in palliative attempt. She has PAC in, last flushed 11-22-11, bilateral percutaneous nephrostomy tubes by IR (next stent changes planned 01-02-12) and is on coumadin for LE DVT. Last CT AP was 10-03-11. She now has prn follow up with Dr Mitzi Hansen and will see Dr Nelly Rout 02-21-12.  History is of heavy menstrual bleeding x years, then 4 months of continuous vaginal bleeding fall 2012. She presented to ED with pain, with CT AP 05-07-2011 in [facility] system with 20 x 16.7 x 16 cm pelvic mass thought to be uterus, with second 10.3x10.7x8.8 cm mass adjacent, an 11 x 7.5 cm nodal mass encasing right iliac vessels and severe right hydronephrosis. She was seen by [facility] 05-11-11, then went to exploratory laparotomy with supracervical hysterectomy at [facility] by Dr.Wendy Nelly Rout on 05-22-11. At surgery she had a 24 cm uterus extending to bilateral pelvic sidewalls with an ~ 15 cm retroperitoneal mass. Pathology [phone]) showed multifocal leiomyosarcoma spanning 17 cm, involving uterus and right adnexa, + LVSI. Postoperatively she was tachycardic which improved with 2 units PRBCs; she also had CT angio chest done during that hospitalization 05-24-11 which was negative for PE but did show multiple bilateral pulmonary nodules radiographically consistent with metastases. She was seen in follow up by Dr.Brewster at clinic on 05-31-2011, with increased swelling of right thigh and right back pain; venous doppler was negative for clot RLE. She had bilateral ureteral obstruction by 06-12-2011, with creatinine up to 3.76, with bilateral PCNs placed by IR. She had PAC placed by IR. She began gemzar/taxotere 06-25-11, day 1 day 8 q 21 days with neulasta support. She has required PRBC  transfusions on several occasions thru this course and had IV iron on 08-06-11. She had new RLE DVT documented 08-17-11. Restaging CT 08-20-11 had fairly stable disease in chest and still significant involvement in pelvis.  Cycle 5 was to be gemzar only due to LE swelling possibly exacerbated by taxotere, however day 1 was held on 09-24-11 due to asymptomatic heart rate 150 - 160. She was evaluated in ED with sinus tachycardia vs SVT vs a flutter with 2:1 block. CT angio chest in ED was negative for PE but did show progression of pulmonary mets. Gemzar/ taxotere was discontinued then and tamoxifen + RT begun in further attempt at palliation. She clearly had improvement in symptoms with RT.   Patient has been generally stable since completion of radiation in mid July, tho she has had more pain right lower abdomen for past week and intermittent very uncomfortable sensation of needing to void tho unable to pass any urine from bladder. (Prior to RT would occasionally void small amounts even with bilateral PCNs.) She has had no fever, no gross hematuria or other bleeding, has been more tired and more SOB with activity. She has stable LE swelling now, which is improved over that prior to RT. She has occasional nausea but is able to drink fluids including equivalent of 3-4 Ensure in 24 hrs. She has no significant discomfort now at PCN sites. She denies cough or chest pain. Episodes of tachycardia are essentially gone with the B blocker, with a single episode ~ a week ago that lasted only seconds.  Remainder of 10 point Review of Systems negative.  Objective:  Vital signs in last 24 hours:  BP  104/63  Pulse 91  Temp 97.3 F (36.3 C) (Oral)  Resp 20  Ht 5' 5.5" (1.664 m)  Wt 241 lb 1.6 oz (109.362 kg)  BMI 39.51 kg/m2  LMP 04/09/2011 this weight is down 7 lbs from July. Ambulatory slowly, pale, not icteric, alert and appropriate, respirations not labored at rest RA.  HEENT:PERRLA, sclera clear, anicteric,  oropharynx clear, no lesions and neck supple with midline trachea. Hair is growing back. LymphaticsCervical, supraclavicular, and axillary nodes normal. Resp: clear to auscultation bilaterally and normal percussion bilaterally Cardio: regular rate and rhythm GI: obese, distended, soft, some bowel sounds. Cannot appreciate HSM or mass. Back with PCNs dressed, not tender Extremities: bilateral LE edema R>L tight in lower legs, no clear cords. Neuro:no sensory deficits noted Skin without rash including right lower abdomen Portacath-without erythema or tenderness  Lab Results:  Results for orders placed in visit on 12/10/11  CBC & DIFF AND RETIC      Component Value Range   WBC 15.4 (*) 3.9 - 10.3 10e3/uL   NEUT# 13.4 (*) 1.5 - 6.5 10e3/uL   HGB 7.8 (*) 11.6 - 15.9 g/dL   HCT 21.3 (*) 08.6 - 57.8 %   Platelets 368  145 - 400 10e3/uL   MCV 73.4 (*) 79.5 - 101.0 fL   MCH 23.4 (*) 25.1 - 34.0 pg   MCHC 31.8  31.5 - 36.0 g/dL   RBC 4.69 (*) 6.29 - 5.28 10e6/uL   RDW 19.0 (*) 11.2 - 14.5 %   lymph# 1.0  0.9 - 3.3 10e3/uL   MONO# 0.9  0.1 - 0.9 10e3/uL   Eosinophils Absolute 0.1  0.0 - 0.5 10e3/uL   Basophils Absolute 0.0  0.0 - 0.1 10e3/uL   NEUT% 86.9 (*) 38.4 - 76.8 %   LYMPH% 6.7 (*) 14.0 - 49.7 %   MONO% 5.7  0.0 - 14.0 %   EOS% 0.6  0.0 - 7.0 %   BASO% 0.1  0.0 - 2.0 %   Retic % 2.09  0.70 - 2.10 %   Retic Ct Abs 69.81  33.70 - 90.70 10e3/uL   Immature Retic Fract 21.70 (*) 1.60 - 10.00 %  PROTIME-INR      Component Value Range   Protime 28.8 (*) 10.6 - 13.4 Seconds   INR 2.40  2.00 - 3.50   Lovenox No    COMPREHENSIVE METABOLIC PANEL (CC13)      Component Value Range   Sodium 132 (*) 136 - 145 mEq/L   Potassium 3.8  3.5 - 5.1 mEq/L   Chloride 98  98 - 107 mEq/L   CO2 26  22 - 29 mEq/L   Glucose 88  70 - 99 mg/dl   BUN 8.0  7.0 - 41.3 mg/dL   Creatinine 0.7  0.6 - 1.1 mg/dL   Total Bilirubin 2.44  0.20 - 1.20 mg/dL   Alkaline Phosphatase 243 (*) 40 - 150 U/L   AST 32   5 - 34 U/L   Total Protein 5.4 (*) 6.4 - 8.3 g/dL   ALT 14  0 - 55 U/L   Albumin 2.1 (*) 3.5 - 5.0 g/dL  CORRECTED CALCIUM (WN02)      Component Value Range   Calcium, Corrected 10.3  8.4 - 10.4 mg/dL   Patient agrees to PRBCs, with 2 units to be given at [facility] today.  Studies/Results:  No results found.  Medications: I have reviewed the patient's current medications. She will continue same pain medication including duragesic  patch, with prn oxycodone and hydromorphone.  We have discussed the bladder symptoms and decided to get bladder US as first step. If significant residual fluid in bladder she will need to go back to urology I/O cath or other recommendation. If no residual, consider trying medication for bladder spasm. The RLQ pain could be related to bladder, including bladder irritation from RT. If symptoms persist in next few weeks, we can also get CT particularly to see if other areas that might be amenable to RT.  Assessment/Plan: 11. Metastatic leiomyosarcoma of uterus: progression in pelvis and lung on taxotere/ gemzar, now on tamoxifen which she is tolerating well. She understands that there are not good options for further chemotherapy. Follow up in Sept coordinating with PAC flush and after CT if that is needed. 2.Bilateral percutaneous nephrostomy tubes. Bladder symptoms as above. 3.PAC in  4. tachycardia improved on low dose B blocker  5.RLE DVT continuing coumadin indefinitely. Coumadin clinic assisting 6.Anemia: multifactorial including chemo, RT, gyn bleeding, nephrostomy tubes, chronic disease. Has required PRBCs several times thru this course and will be transfused again today.  7.NCB, patient's request.   Patient and mother were in agreement with plan as above.   LIVESAY,LENNIS P, MD   12/10/2011, 4:08 PM

## 2011-12-10 NOTE — Patient Instructions (Addendum)
Blood Products Information This is information about transfusions of blood products. All blood that is to be transfused is tested for blood type, compatibility with the recipient, and for infections. Except in emergencies, giving a transfusion requires a written consent. Blood transfusions are often given as packed red blood cells. This means the other parts of the blood have been taken out. Blood may be needed to treat severe anemia or bleeding. Other blood products include plasma, platelets, immune globulin, and cryoprecipitate. Blood for transfusion is mostly donated by volunteers. The blood donors are carefully screened for risk factors that could cause disease. Donors are all tested for infections that could be transmitted by blood. The blood product supply today is the safest it has ever been. Some risks do remain.  A minor reaction with fever, chills, or rash happens in about 1% of blood product transfusions.   Life-threatening reactions occur in less than 1 in a million transfusions.   Infection with germs (bacteria), viruses or parasites like malaria can still happen. The risk is very low.   Hepatitis B occurs in about 1 case in 150,000 transfusions.   Hepatitis C is seen once in 500,000.   HIV is transmitted less than once every million transfusions.  When you receive a transfusion of packed red blood cells, your blood is tested for blood group and Rh type. Your blood is also screened for antibodies that could cause a serious reaction. A cross-match test is done to make sure the blood is safe to give.  Talk with your caregiver if you have any concerns about receiving a transfusion of blood products. Make sure your questions are answered. Transfusions are not given if your caregiver feels the risk is greater than the need. Document Released: 04/02/2005 Document Revised: 03/22/2011 Document Reviewed: 09/20/2006 [employer] Patient Information 2012 [employer]. 

## 2011-12-10 NOTE — Telephone Encounter (Signed)
Gave pt appt calendar for August 2013 U/S @ [facility] and September 2013 lab,Ct and MD visit , gave pt oral contrast

## 2011-12-10 NOTE — Progress Notes (Signed)
INR therapeutic today (2.4) No changes in meds or diet.  Still not eating much.  No problems with bleeding, bruising, s/s clotting.   Continue 2.5mg  daily. Recheck INR in 4 weeks.

## 2011-12-11 LAB — TYPE AND SCREEN
ABO/RH(D): O POS
Antibody Screen: NEGATIVE
Unit division: 0

## 2011-12-13 ENCOUNTER — Ambulatory Visit (HOSPITAL_COMMUNITY)
Admission: RE | Admit: 2011-12-13 | Discharge: 2011-12-13 | Disposition: A | Discharge status: Home / Self Care | Payer: Medicaid Other | Source: Ambulatory Visit | Attending: Oncology | Admitting: Oncology

## 2011-12-13 ENCOUNTER — Ambulatory Visit (HOSPITAL_BASED_OUTPATIENT_CLINIC_OR_DEPARTMENT_OTHER): Payer: Medicaid Other | Admitting: Physician Assistant

## 2011-12-13 ENCOUNTER — Other Ambulatory Visit: Payer: Self-pay | Admitting: Oncology

## 2011-12-13 ENCOUNTER — Other Ambulatory Visit (HOSPITAL_BASED_OUTPATIENT_CLINIC_OR_DEPARTMENT_OTHER): Payer: Medicaid Other | Admitting: Physician Assistant

## 2011-12-13 ENCOUNTER — Encounter: Payer: Self-pay | Admitting: Internal Medicine

## 2011-12-13 ENCOUNTER — Telehealth: Payer: Self-pay | Admitting: *Deleted

## 2011-12-13 ENCOUNTER — Encounter: Payer: Self-pay | Admitting: Physician Assistant

## 2011-12-13 ENCOUNTER — Ambulatory Visit (HOSPITAL_BASED_OUTPATIENT_CLINIC_OR_DEPARTMENT_OTHER): Payer: Medicaid Other | Admitting: Lab

## 2011-12-13 ENCOUNTER — Ambulatory Visit (HOSPITAL_COMMUNITY)
Admission: RE | Admit: 2011-12-13 | Discharge: 2011-12-13 | Disposition: A | Discharge status: Home / Self Care | Payer: Medicaid Other | Source: Ambulatory Visit | Attending: Physician Assistant | Admitting: Physician Assistant

## 2011-12-13 ENCOUNTER — Encounter: Payer: Self-pay | Admitting: *Deleted

## 2011-12-13 ENCOUNTER — Other Ambulatory Visit: Payer: Self-pay

## 2011-12-13 VITALS — BP 107/69 | HR 102 | Temp 97.7°F | Resp 22 | Ht 65.5 in | Wt 244.6 lb

## 2011-12-13 DIAGNOSIS — Z936 Other artificial openings of urinary tract status: Secondary | ICD-10-CM | POA: Insufficient documentation

## 2011-12-13 DIAGNOSIS — R0600 Dyspnea, unspecified: Secondary | ICD-10-CM

## 2011-12-13 DIAGNOSIS — C549 Malignant neoplasm of corpus uteri, unspecified: Secondary | ICD-10-CM

## 2011-12-13 DIAGNOSIS — C7800 Secondary malignant neoplasm of unspecified lung: Secondary | ICD-10-CM

## 2011-12-13 DIAGNOSIS — N949 Unspecified condition associated with female genital organs and menstrual cycle: Secondary | ICD-10-CM | POA: Insufficient documentation

## 2011-12-13 DIAGNOSIS — C55 Malignant neoplasm of uterus, part unspecified: Secondary | ICD-10-CM

## 2011-12-13 DIAGNOSIS — R1011 Right upper quadrant pain: Secondary | ICD-10-CM

## 2011-12-13 DIAGNOSIS — R188 Other ascites: Secondary | ICD-10-CM | POA: Insufficient documentation

## 2011-12-13 DIAGNOSIS — C787 Secondary malignant neoplasm of liver and intrahepatic bile duct: Secondary | ICD-10-CM

## 2011-12-13 LAB — URINALYSIS, MICROSCOPIC - CHCC
Bilirubin (Urine): NEGATIVE
Blood: NEGATIVE
Glucose: NEGATIVE g/dL
Glucose: NEGATIVE g/dL
Ketones: 15 mg/dL
Nitrite: NEGATIVE
Protein: 100 mg/dL
Specific Gravity, Urine: 1.01 (ref 1.003–1.035)
Specific Gravity, Urine: 1.01 (ref 1.003–1.035)
pH: 6.5 (ref 4.6–8.0)
pH: 7 (ref 4.6–8.0)

## 2011-12-13 LAB — BASIC METABOLIC PANEL (CC13)
BUN: 10 mg/dL (ref 7.0–26.0)
CO2: 24 mEq/L (ref 22–29)
Chloride: 96 mEq/L — ABNORMAL LOW (ref 98–107)
Glucose: 76 mg/dl (ref 70–99)
Potassium: 4.3 mEq/L (ref 3.5–5.1)

## 2011-12-13 LAB — CBC WITH DIFFERENTIAL/PLATELET
Basophils Absolute: 0 10*3/uL (ref 0.0–0.1)
HCT: 31 % — ABNORMAL LOW (ref 34.8–46.6)
HGB: 10.1 g/dL — ABNORMAL LOW (ref 11.6–15.9)
LYMPH%: 3.8 % — ABNORMAL LOW (ref 14.0–49.7)
MONO#: 1.1 10*3/uL — ABNORMAL HIGH (ref 0.1–0.9)
NEUT%: 91.2 % — ABNORMAL HIGH (ref 38.4–76.8)
Platelets: 386 10*3/uL (ref 145–400)
WBC: 23.2 10*3/uL — ABNORMAL HIGH (ref 3.9–10.3)
lymph#: 0.9 10*3/uL (ref 0.9–3.3)

## 2011-12-13 MED ORDER — CIPROFLOXACIN HCL 500 MG PO TABS: 500.0000 mg | ORAL_TABLET | Freq: Two times a day (BID) | ORAL | Status: DC

## 2011-12-13 MED ORDER — FENTANYL 50 MCG/HR TD PT72: MEDICATED_PATCH | TRANSDERMAL | Status: DC

## 2011-12-13 NOTE — Progress Notes (Signed)
RECEIVED A FAX FROM [facility] CONCERNING A PRIOR AUTHORIZATION FOR FENTANYL. THIS REQUEST WAS PLACED IN THE MANAGED CARE BIN. 

## 2011-12-13 NOTE — Patient Instructions (Addendum)
Take Cipro twice a day for 10 days Place a 50 mcg Fentanyl patch in addition to your 100 mcg Fentanyl patch every 3 days Continue taking your breakthrough pain medications as you currently do. Follow up with Dr. Darrold Span as previously scheduled

## 2011-12-13 NOTE — Progress Notes (Unsigned)
[employer], [id number], approved fentanyl 10 patches until 12/05/12 auth # [id number].

## 2011-12-13 NOTE — Telephone Encounter (Signed)
THE PAIN STARTED TWO DAYS AGO. PT. HAS A FENTANYL PATCH WHICH WAS PLACED ON 12/12/11. SHE TOOK DILAUDID SOMETIME YESTERDAY AFTERNOON AND OXYCODONE AT 6:15AM THIS MORNING. NOTHING HAS HELPED. PT. IS SHORT OF BREATH AT REST. THIS NOTE WAS GIVEN TO DR.LIVESAY'S NURSE, LOUISE ARCHAMBAULT,RN. SHE WILL SPEAK TO ADRENA JOHNSON,PA AND INSTRUCT PT. THIS NURSE NOTIFIED PT. OF THE ABOVE INFORMATION. SHE VOICES UNDERSTANDING.

## 2011-12-16 LAB — URINE CULTURE

## 2011-12-17 ENCOUNTER — Other Ambulatory Visit: Payer: Self-pay

## 2011-12-17 ENCOUNTER — Encounter (HOSPITAL_COMMUNITY): Payer: Self-pay | Admitting: Emergency Medicine

## 2011-12-17 ENCOUNTER — Inpatient Hospital Stay (HOSPITAL_COMMUNITY): Payer: Medicaid Other

## 2011-12-17 ENCOUNTER — Inpatient Hospital Stay (HOSPITAL_COMMUNITY)
Admission: EM | Admit: 2011-12-17 | Discharge: 2011-12-19 | DRG: 872 | Disposition: A | Discharge status: Hospice | Payer: Medicaid Other | Attending: Internal Medicine | Admitting: Internal Medicine

## 2011-12-17 ENCOUNTER — Emergency Department (HOSPITAL_COMMUNITY): Payer: Medicaid Other

## 2011-12-17 DIAGNOSIS — N179 Acute kidney failure, unspecified: Secondary | ICD-10-CM

## 2011-12-17 DIAGNOSIS — E871 Hypo-osmolality and hyponatremia: Secondary | ICD-10-CM

## 2011-12-17 DIAGNOSIS — R1900 Intra-abdominal and pelvic swelling, mass and lump, unspecified site: Secondary | ICD-10-CM

## 2011-12-17 DIAGNOSIS — Z515 Encounter for palliative care: Secondary | ICD-10-CM

## 2011-12-17 DIAGNOSIS — Z79899 Other long term (current) drug therapy: Secondary | ICD-10-CM

## 2011-12-17 DIAGNOSIS — D649 Anemia, unspecified: Secondary | ICD-10-CM

## 2011-12-17 DIAGNOSIS — D72829 Elevated white blood cell count, unspecified: Secondary | ICD-10-CM

## 2011-12-17 DIAGNOSIS — Z6841 Body Mass Index (BMI) 40.0 and over, adult: Secondary | ICD-10-CM

## 2011-12-17 DIAGNOSIS — D509 Iron deficiency anemia, unspecified: Secondary | ICD-10-CM

## 2011-12-17 DIAGNOSIS — D638 Anemia in other chronic diseases classified elsewhere: Secondary | ICD-10-CM | POA: Diagnosis present

## 2011-12-17 DIAGNOSIS — R0600 Dyspnea, unspecified: Secondary | ICD-10-CM

## 2011-12-17 DIAGNOSIS — C779 Secondary and unspecified malignant neoplasm of lymph node, unspecified: Secondary | ICD-10-CM | POA: Diagnosis present

## 2011-12-17 DIAGNOSIS — K219 Gastro-esophageal reflux disease without esophagitis: Secondary | ICD-10-CM

## 2011-12-17 DIAGNOSIS — C7889 Secondary malignant neoplasm of other digestive organs: Secondary | ICD-10-CM | POA: Diagnosis present

## 2011-12-17 DIAGNOSIS — C799 Secondary malignant neoplasm of unspecified site: Secondary | ICD-10-CM

## 2011-12-17 DIAGNOSIS — A419 Sepsis, unspecified organism: Secondary | ICD-10-CM | POA: Diagnosis present

## 2011-12-17 DIAGNOSIS — C7800 Secondary malignant neoplasm of unspecified lung: Secondary | ICD-10-CM | POA: Diagnosis present

## 2011-12-17 DIAGNOSIS — R5381 Other malaise: Secondary | ICD-10-CM | POA: Diagnosis present

## 2011-12-17 DIAGNOSIS — E669 Obesity, unspecified: Secondary | ICD-10-CM

## 2011-12-17 DIAGNOSIS — C801 Malignant (primary) neoplasm, unspecified: Secondary | ICD-10-CM

## 2011-12-17 DIAGNOSIS — N390 Urinary tract infection, site not specified: Secondary | ICD-10-CM

## 2011-12-17 DIAGNOSIS — N1330 Unspecified hydronephrosis: Secondary | ICD-10-CM

## 2011-12-17 DIAGNOSIS — C549 Malignant neoplasm of corpus uteri, unspecified: Secondary | ICD-10-CM

## 2011-12-17 DIAGNOSIS — R0609 Other forms of dyspnea: Secondary | ICD-10-CM | POA: Diagnosis present

## 2011-12-17 DIAGNOSIS — C574 Malignant neoplasm of uterine adnexa, unspecified: Secondary | ICD-10-CM | POA: Diagnosis present

## 2011-12-17 DIAGNOSIS — R791 Abnormal coagulation profile: Secondary | ICD-10-CM

## 2011-12-17 DIAGNOSIS — C787 Secondary malignant neoplasm of liver and intrahepatic bile duct: Secondary | ICD-10-CM | POA: Diagnosis present

## 2011-12-17 DIAGNOSIS — Z7901 Long term (current) use of anticoagulants: Secondary | ICD-10-CM

## 2011-12-17 DIAGNOSIS — R110 Nausea: Secondary | ICD-10-CM

## 2011-12-17 DIAGNOSIS — R531 Weakness: Secondary | ICD-10-CM

## 2011-12-17 DIAGNOSIS — R600 Localized edema: Secondary | ICD-10-CM

## 2011-12-17 DIAGNOSIS — E860 Dehydration: Secondary | ICD-10-CM

## 2011-12-17 DIAGNOSIS — R5383 Other fatigue: Secondary | ICD-10-CM | POA: Diagnosis present

## 2011-12-17 DIAGNOSIS — F411 Generalized anxiety disorder: Secondary | ICD-10-CM | POA: Diagnosis present

## 2011-12-17 DIAGNOSIS — R0989 Other specified symptoms and signs involving the circulatory and respiratory systems: Secondary | ICD-10-CM | POA: Diagnosis present

## 2011-12-17 DIAGNOSIS — D62 Acute posthemorrhagic anemia: Secondary | ICD-10-CM

## 2011-12-17 DIAGNOSIS — Z9221 Personal history of antineoplastic chemotherapy: Secondary | ICD-10-CM

## 2011-12-17 DIAGNOSIS — C55 Malignant neoplasm of uterus, part unspecified: Secondary | ICD-10-CM

## 2011-12-17 DIAGNOSIS — Z923 Personal history of irradiation: Secondary | ICD-10-CM

## 2011-12-17 DIAGNOSIS — Z66 Do not resuscitate: Secondary | ICD-10-CM | POA: Diagnosis present

## 2011-12-17 DIAGNOSIS — A414 Sepsis due to anaerobes: Principal | ICD-10-CM | POA: Diagnosis present

## 2011-12-17 DIAGNOSIS — I82409 Acute embolism and thrombosis of unspecified deep veins of unspecified lower extremity: Secondary | ICD-10-CM

## 2011-12-17 HISTORY — DX: Malignant neoplasm of unspecified part of unspecified bronchus or lung: C34.90

## 2011-12-17 LAB — URINALYSIS, ROUTINE W REFLEX MICROSCOPIC
Glucose, UA: NEGATIVE mg/dL
Ketones, ur: 15 mg/dL — AB
Protein, ur: 100 mg/dL — AB

## 2011-12-17 LAB — CBC WITH DIFFERENTIAL/PLATELET
Basophils Relative: 0 % (ref 0–1)
Eosinophils Relative: 0 % (ref 0–5)
Hemoglobin: 10.5 g/dL — ABNORMAL LOW (ref 12.0–15.0)
MCH: 24.9 pg — ABNORMAL LOW (ref 26.0–34.0)
MCV: 74.3 fL — ABNORMAL LOW (ref 78.0–100.0)
Monocytes Absolute: 2.3 10*3/uL — ABNORMAL HIGH (ref 0.1–1.0)
Neutrophils Relative %: 89 % — ABNORMAL HIGH (ref 43–77)
RBC: 4.21 MIL/uL (ref 3.87–5.11)

## 2011-12-17 LAB — BASIC METABOLIC PANEL
CO2: 21 mEq/L (ref 19–32)
Glucose, Bld: 97 mg/dL (ref 70–99)
Potassium: 4.5 mEq/L (ref 3.5–5.1)
Sodium: 124 mEq/L — ABNORMAL LOW (ref 135–145)

## 2011-12-17 LAB — URINE MICROSCOPIC-ADD ON

## 2011-12-17 LAB — PROTIME-INR: INR: 4.55 — ABNORMAL HIGH (ref 0.00–1.49)

## 2011-12-17 MED ORDER — LEVOFLOXACIN IN D5W 500 MG/100ML IV SOLN
500.0000 mg | INTRAVENOUS | Status: DC
  Administered 2011-12-17 – 2011-12-18 (×2): 500 mg via INTRAVENOUS
  Filled 2011-12-17 (×3): qty 100

## 2011-12-17 MED ORDER — OXYCODONE-ACETAMINOPHEN 5-325 MG PO TABS
1.0000 | ORAL_TABLET | Freq: Four times a day (QID) | ORAL | Status: DC | PRN
  Administered 2011-12-18 (×2): 1 via ORAL
  Filled 2011-12-17 (×2): qty 1

## 2011-12-17 MED ORDER — OXYCODONE HCL 5 MG PO TABS
5.0000 mg | ORAL_TABLET | Freq: Four times a day (QID) | ORAL | Status: DC | PRN
  Administered 2011-12-18 (×2): 5 mg via ORAL
  Filled 2011-12-17 (×3): qty 1

## 2011-12-17 MED ORDER — FERROUS FUMARATE 325 (106 FE) MG PO TABS
1.0000 | ORAL_TABLET | Freq: Two times a day (BID) | ORAL | Status: DC
  Administered 2011-12-17 (×2): 106 mg via ORAL
  Filled 2011-12-17 (×5): qty 1

## 2011-12-17 MED ORDER — OXYCODONE-ACETAMINOPHEN 10-325 MG PO TABS: 1.0000 | ORAL_TABLET | Freq: Four times a day (QID) | ORAL | Status: DC | PRN

## 2011-12-17 MED ORDER — MAGNESIUM HYDROXIDE 400 MG/5ML PO SUSP: 5.0000 mL | Freq: Every day | ORAL | Status: DC | PRN

## 2011-12-17 MED ORDER — METOPROLOL TARTRATE 12.5 MG HALF TABLET
12.5000 mg | ORAL_TABLET | Freq: Two times a day (BID) | ORAL | Status: DC
  Administered 2011-12-17 – 2011-12-19 (×4): 12.5 mg via ORAL
  Filled 2011-12-17 (×6): qty 1

## 2011-12-17 MED ORDER — PANTOPRAZOLE SODIUM 40 MG PO TBEC
40.0000 mg | DELAYED_RELEASE_TABLET | Freq: Two times a day (BID) | ORAL | Status: DC
  Administered 2011-12-17: 40 mg via ORAL
  Filled 2011-12-17 (×2): qty 1

## 2011-12-17 MED ORDER — ALBUTEROL SULFATE (5 MG/ML) 0.5% IN NEBU
5.0000 mg | INHALATION_SOLUTION | Freq: Once | RESPIRATORY_TRACT | Status: AC
  Administered 2011-12-17: 5 mg via RESPIRATORY_TRACT
  Filled 2011-12-17: qty 1

## 2011-12-17 MED ORDER — LORAZEPAM 0.5 MG PO TABS
0.5000 mg | ORAL_TABLET | Freq: Four times a day (QID) | ORAL | Status: DC | PRN
Start: 2011-12-17 — End: 2011-12-19
  Administered 2011-12-17 – 2011-12-18 (×2): 0.5 mg via ORAL
  Filled 2011-12-17: qty 1
  Filled 2011-12-17: qty 2

## 2011-12-17 MED ORDER — DOCUSATE SODIUM 100 MG PO CAPS
200.0000 mg | ORAL_CAPSULE | Freq: Every day | ORAL | Status: DC
  Administered 2011-12-17 – 2011-12-19 (×3): 200 mg via ORAL
  Filled 2011-12-17 (×3): qty 2

## 2011-12-17 MED ORDER — ACETAMINOPHEN 325 MG PO TABS: 650.0000 mg | ORAL_TABLET | Freq: Four times a day (QID) | ORAL | Status: DC | PRN

## 2011-12-17 MED ORDER — TAMOXIFEN CITRATE 20 MG PO TABS
20.0000 mg | ORAL_TABLET | Freq: Every day | ORAL | Status: DC
  Administered 2011-12-17: 20 mg via ORAL
  Filled 2011-12-17: qty 1

## 2011-12-17 MED ORDER — ONDANSETRON HCL 4 MG/2ML IJ SOLN
4.0000 mg | Freq: Four times a day (QID) | INTRAMUSCULAR | Status: DC | PRN
  Administered 2011-12-17 – 2011-12-18 (×2): 4 mg via INTRAVENOUS
  Filled 2011-12-17 (×2): qty 2

## 2011-12-17 MED ORDER — SODIUM CHLORIDE 0.9 % IV SOLN
INTRAVENOUS | Status: DC
  Administered 2011-12-17: 13:00:00 via INTRAVENOUS
  Administered 2011-12-17: 75 mL via INTRAVENOUS
  Administered 2011-12-18: 18:00:00 via INTRAVENOUS

## 2011-12-17 MED ORDER — ACETAMINOPHEN 650 MG RE SUPP: 650.0000 mg | Freq: Four times a day (QID) | RECTAL | Status: DC | PRN

## 2011-12-17 MED ORDER — HYDROMORPHONE HCL PF 1 MG/ML IJ SOLN
1.0000 mg | INTRAMUSCULAR | Status: DC | PRN
  Administered 2011-12-17 – 2011-12-18 (×3): 1 mg via INTRAVENOUS
  Filled 2011-12-17 (×3): qty 1

## 2011-12-17 MED ORDER — FREE WATER: 40.0000 mL | Status: DC

## 2011-12-17 MED ORDER — FENTANYL 100 MCG/HR TD PT72
100.0000 ug | MEDICATED_PATCH | TRANSDERMAL | Status: DC
  Administered 2011-12-18: 100 ug via TRANSDERMAL
  Filled 2011-12-17: qty 1

## 2011-12-17 MED ORDER — SODIUM CHLORIDE 0.9 % IV BOLUS (SEPSIS)
250.0000 mL | Freq: Once | INTRAVENOUS | Status: AC
  Administered 2011-12-17: 250 mL via INTRAVENOUS

## 2011-12-17 MED ORDER — CIPROFLOXACIN IN D5W 400 MG/200ML IV SOLN
400.0000 mg | Freq: Once | INTRAVENOUS | Status: AC
  Administered 2011-12-17: 400 mg via INTRAVENOUS
  Filled 2011-12-17: qty 200

## 2011-12-17 MED ORDER — ADULT MULTIVITAMIN W/MINERALS CH
1.0000 | ORAL_TABLET | Freq: Every day | ORAL | Status: DC
  Administered 2011-12-17 – 2011-12-19 (×3): 1 via ORAL
  Filled 2011-12-17 (×3): qty 1

## 2011-12-17 MED ORDER — WARFARIN - PHARMACIST DOSING INPATIENT: Freq: Every day | Status: DC

## 2011-12-17 MED ORDER — SODIUM CHLORIDE 0.9 % IV SOLN: INTRAVENOUS | Status: AC

## 2011-12-17 MED ORDER — PANTOPRAZOLE SODIUM 40 MG PO TBEC
40.0000 mg | DELAYED_RELEASE_TABLET | Freq: Two times a day (BID) | ORAL | Status: DC
  Administered 2011-12-18 – 2011-12-19 (×3): 40 mg via ORAL
  Filled 2011-12-17 (×4): qty 1

## 2011-12-17 MED ORDER — IOHEXOL 300 MG/ML  SOLN
100.0000 mL | Freq: Once | INTRAMUSCULAR | Status: AC | PRN
  Administered 2011-12-17: 100 mL via INTRAVENOUS

## 2011-12-17 MED ORDER — TAMOXIFEN CITRATE 10 MG PO TABS
20.0000 mg | ORAL_TABLET | Freq: Every day | ORAL | Status: DC
  Filled 2011-12-17: qty 2

## 2011-12-17 MED ORDER — FENTANYL 100 MCG/HR TD PT72: 100.0000 ug | MEDICATED_PATCH | TRANSDERMAL | Status: DC

## 2011-12-17 MED FILL — Tamoxifen Citrate Tab 10 MG (Base Equivalent): ORAL | Qty: 2 | Status: AC

## 2011-12-17 NOTE — ED Notes (Signed)
As per EMS pt sts she was SOB for a week and chest pain. Un productive pain. No LOC/N/V.VSS.pt warm to touch

## 2011-12-17 NOTE — ED Notes (Signed)
Pt c/o sob since Thursday, stage IV uterus cancer. Got up this morning having CP, the CP has gone down some. Pt alert, oriented, Bp 104/62, HR 122 sinus Tachy.

## 2011-12-17 NOTE — ED Provider Notes (Signed)
History     CSN: [id number]  Arrival date & time 12/17/11  0710   First MD Initiated Contact with Patient 12/17/11 [phone]      Chief Complaint  Patient presents with  . Shortness of Breath     HPI Pt was seen at 0745.  Per pt, c/o gradual onset and worsening of persistent SOB for the past 1 week, worse since this morning.  Pt states she woke up today also with chest "pain."  Pt was eval by her Heme/Onc MD last week for same.  States she "had a CXR but I don't know the results," told "my sodium was low," and she had a PRBC transfusion for anemia.  States she is currently not taking chemo nor XRT and she has been "talking about hospice" to her family and doctors.  Denies palpitations, no cough, no fevers, no back pain, no abd pain, no N/V/D.    Past Medical History  Diagnosis Date  . Anemia     Iron deficiency/prbc's  . H/O hiatal hernia   . H/O menorrhagia   . DDD (degenerative disc disease)   . History of chemotherapy     taxotere/gemzar  . DVT (deep venous thrombosis)     RLE on coumadin  . UTI (lower urinary tract infection)   . Obesity   . DNR (do not resuscitate)     per patient request  . History of chemotherapy     taxotere/gemzar  . Blood transfusion 2001, 09/28/11    2 units PRBCs 09/28/11 hgb 8.6  . History of radiation therapy 10/16/11-10/31/11    30Gy in 67fxs,pelvis/low abdomen  . 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 Metastatic cancer     liver/lung/spleen/mediastinal/hilar lymph nodes    Past Surgical History  Procedure Date  . Total abdominal hysterectomy w/ bilateral salpingoophorectomy 05/22/11  . Laparotomy 05/22/2011    Procedure: EXPLORATORY LAPAROTOMY;  Surgeon: Laurette Schimke, MD PHD;  Location: [facility];  Service: Gynecology;  Laterality: N/A;  . Abdominal hysterectomy 05/22/2011    Procedure: HYSTERECTOMY ABDOMINAL;  Surgeon: Laurette Schimke, MD PHD;  Location: [facility];  Service: Gynecology;  Laterality: N/A;   with Resection of Pelvic Mass    . Salpingoophorectomy 05/22/2011    Procedure: SALPINGO OOPHERECTOMY;  Surgeon: Laurette Schimke, MD PHD;  Location: [facility];  Service: Gynecology;  Laterality: Bilateral;  . Portacath placement 06/18/2011    Dr. Mervyn Skeeters. Hoss, 8 Fr, Rt. port w/ tip in SVC  . B/l nephrostomy tubes 06/13/11    Family History  Problem Relation Age of Onset  . Breast cancer Maternal Aunt   . Heart attack Other   . Lung cancer Father     History  Substance Use Topics  . Smoking status: Former Smoker -- 1.0 packs/day for 34 years    Quit date: 05/22/2011  . Smokeless tobacco: Never Used  . Alcohol Use: No    Review of Systems ROS: Statement: All systems negative except as marked or noted in the HPI; Constitutional: Negative for fever and chills. ; ; Eyes: Negative for eye pain, redness and discharge. ; ; ENMT: Negative for ear pain, hoarseness, nasal congestion, sinus pressure and sore throat. ; ; Cardiovascular: +CP, SOB. Negative for palpitations, diaphoresis, and peripheral edema. ; ; Respiratory: Negative for cough, wheezing and stridor. ; ; Gastrointestinal: Negative for nausea, vomiting, diarrhea, abdominal pain, blood in stool, hematemesis, jaundice and rectal bleeding. . ; ; Genitourinary: Negative for dysuria,  flank pain and hematuria. ; ; Musculoskeletal: Negative for back pain and neck pain. Negative for swelling and trauma.; ; Skin: Negative for pruritus, rash, abrasions, blisters, bruising and skin lesion.; ; Neuro: Negative for headache, lightheadedness and neck stiffness. Negative for weakness, altered level of consciousness , altered mental status, extremity weakness, paresthesias, involuntary movement, seizure and syncope.     Allergies  Aspirin; Morphine and related; and Penicillins  Home Medications   Current Outpatient Rx  Name Route Sig Dispense Refill  . CIPROFLOXACIN HCL 500 MG PO TABS Oral Take 1 tablet (500 mg total) by mouth 2 (two) times daily. 20 tablet 0    . DIPHENHYDRAMINE HCL 25 MG PO CAPS Oral Take 25 mg by mouth every 6 (six) hours as needed. For itching    . DOCUSATE SODIUM 100 MG PO CAPS Oral Take 200 mg by mouth daily.     . FENTANYL 100 MCG/HR TD PT72 Transdermal Place 1 patch (100 mcg total) onto the skin every 3 (three) days. 10 patch 0    Prescription given to patient  . FENTANYL 50 MCG/HR TD PT72  Apply one 50 mcg patch on the skin in addition to one 100 mcg patch ( total dose 150 mcg) every 3 days 10 patch 0  . FERROUS FUMARATE 325 (106 FE) MG PO TABS Oral Take 1 tablet (106 mg of iron total) by mouth 2 (two) times daily. 30 each 2  . HYDROMORPHONE HCL 4 MG PO TABS Oral Take 1 tablet (4 mg total) by mouth every 4 (four) hours as needed for pain. 40 tablet 0    Prescription given to patient.  Marland Kitchen LORAZEPAM 0.5 MG PO TABS  Take 1 every 4- 6h prn nausea 30 tablet 0  . MAGNESIUM HYDROXIDE 400 MG/5ML PO SUSP Oral Take 5 mLs by mouth daily as needed.    Marland Kitchen METOCLOPRAMIDE HCL 10 MG PO TABS  Take 1 tablet before meals and at bedtime = 4 per day to help stomach empty and bowels move 60 tablet 1  . METOPROLOL SUCCINATE ER 25 MG PO TB24 Oral Take 1 tablet (25 mg total) by mouth daily. 1 tablet at bedtime 30 tablet 2    Prescription given to patient  . MULTIVITAMIN PO Oral Take 1 tablet by mouth daily.    . OXYBUTYNIN CHLORIDE 5 MG PO TABS Oral Take 1 tablet (5 mg total) by mouth every 8 (eight) hours as needed. For bladder spasm 30 tablet 1    Prescription given to patient  . PERCOCET 10-325 MG PO TABS Oral Take 1 tablet by mouth every 6 (six) hours as needed for pain. 100 tablet 0    Dispense as written.    Prescription Given to Patient  . POLYETHYLENE GLYCOL 3350 PO POWD Oral Take 17 g by mouth daily. 255 g 1    TO KEEP BOWELS MOVING DAILY  . POTASSIUM CHLORIDE CRYS ER 20 MEQ PO TBCR  Take 2 tabs today then daily until finished 8 tablet 0  . PROMETHAZINE HCL 25 MG PO TABS Oral Take 1 tablet (25 mg total) by mouth every 6 (six) hours as needed  for nausea. 30 tablet 0  . RANITIDINE HCL 75 MG PO TABS Oral Take 75 mg by mouth 2 (two) times daily.    Marland Kitchen TAMOXIFEN CITRATE 20 MG PO TABS Oral Take 1 tablet (20 mg total) by mouth daily. 30 tablet 2  . WARFARIN SODIUM 5 MG PO TABS  1 tablet by mouth daily between  4 pm and 6 pm, or as directed 50 tablet 1    BP 91/56  Pulse 117  Temp 98.2 F (36.8 C) (Oral)  Resp 18  SpO2 98%  LMP 04/09/2011  Physical Exam 0750: Physical examination:  Nursing notes reviewed; Vital signs and O2 SAT reviewed;  Constitutional: Well developed, Well nourished, In no acute distress; Head:  Normocephalic, atraumatic; Eyes: EOMI, PERRL, No scleral icterus; ENMT: Mouth and pharynx normal, Mucous membranes dry; Neck: Supple, Full range of motion, No lymphadenopathy; Cardiovascular: Tachycardic rate and rhythm, No gallop; Respiratory: Breath sounds coarse & equal bilaterally, No wheezes.  Speaking full sentences with ease, Normal respiratory effort/excursion; Chest: Nontender, Movement normal; Abdomen: Soft, Nontender, Nondistended, Normal bowel sounds;; Extremities: Pulses normal, No tenderness, +1 bilat pedal edema, No calf asymmetry.; Neuro: AA&Ox3, Major CN grossly intact.  Speech clear. No gross focal motor or sensory deficits in extremities.; Skin: Color pale, Warm, Dry, no rash.    ED Course  Procedures    MDM  MDM Reviewed: nursing note, vitals and previous chart Reviewed previous: ECG and labs Interpretation: ECG, labs and x-ray    Date: 12/17/2011  Rate: 117  Rhythm: sinus tachycardia  QRS Axis: normal  Intervals: normal  ST/T Wave abnormalities: nonspecific T wave changes  Conduction Disutrbances:none  Narrative Interpretation:   Old EKG Reviewed: unchanged; no significant changes from previous EKG dated 09/24/2011.   Results for orders placed during the hospital encounter of 12/17/11  BASIC METABOLIC PANEL      Component Value Range   Sodium 124 (*) 135 - 145 mEq/L   Potassium 4.5  3.5 -  5.1 mEq/L   Chloride 91 (*) 96 - 112 mEq/L   CO2 21  19 - 32 mEq/L   Glucose, Bld 97  70 - 99 mg/dL   BUN 23  6 - 23 mg/dL   Creatinine, Ser 1.61 (*) 0.50 - 1.10 mg/dL   Calcium 9.6  8.4 - 09.6 mg/dL   GFR calc non Af Amer 50 (*) >90 mL/min   GFR calc Af Amer 58 (*) >90 mL/min  CBC WITH DIFFERENTIAL      Component Value Range   WBC 26.1 (*) 4.0 - 10.5 K/uL   RBC 4.21  3.87 - 5.11 MIL/uL   Hemoglobin 10.5 (*) 12.0 - 15.0 g/dL   HCT 04.5 (*) 40.9 - 81.1 %   MCV 74.3 (*) 78.0 - 100.0 fL   MCH 24.9 (*) 26.0 - 34.0 pg   MCHC 33.5  30.0 - 36.0 g/dL   RDW 91.4 (*) 78.2 - 95.6 %   Platelets 433 (*) 150 - 400 K/uL   Neutrophils Relative 89 (*) 43 - 77 %   Lymphocytes Relative 2 (*) 12 - 46 %   Monocytes Relative 9  3 - 12 %   Eosinophils Relative 0  0 - 5 %   Basophils Relative 0  0 - 1 %   Neutro Abs 23.3 (*) 1.7 - 7.7 K/uL   Lymphs Abs 0.5 (*) 0.7 - 4.0 K/uL   Monocytes Absolute 2.3 (*) 0.1 - 1.0 K/uL   Eosinophils Absolute 0.0  0.0 - 0.7 K/uL   Basophils Absolute 0.0  0.0 - 0.1 K/uL   WBC Morphology MILD LEFT SHIFT (1-5% METAS, OCC MYELO, OCC BANDS)    URINALYSIS, ROUTINE W REFLEX MICROSCOPIC      Component Value Range   Color, Urine ORANGE (*) YELLOW   APPearance TURBID (*) CLEAR   Specific Gravity, Urine 1.037 (*) 1.005 -  1.030   pH 5.0  5.0 - 8.0   Glucose, UA NEGATIVE  NEGATIVE mg/dL   Hgb urine dipstick LARGE (*) NEGATIVE   Bilirubin Urine MODERATE (*) NEGATIVE   Ketones, ur 15 (*) NEGATIVE mg/dL   Protein, ur 161 (*) NEGATIVE mg/dL   Urobilinogen, UA 0.2  0.0 - 1.0 mg/dL   Nitrite NEGATIVE  NEGATIVE   Leukocytes, UA MODERATE (*) NEGATIVE  TROPONIN I      Component Value Range   Troponin I <0.30  <0.30 ng/mL  PRO B NATRIURETIC PEPTIDE      Component Value Range   Pro B Natriuretic peptide (BNP) 3666.0 (*) 0 - 125 pg/mL  PROTIME-INR      Component Value Range   Prothrombin Time 43.8 (*) 11.6 - 15.2 seconds   INR 4.55 (*) 0.00 - 1.49  URINE MICROSCOPIC-ADD ON       Component Value Range   Squamous Epithelial / LPF MANY (*) RARE   WBC, UA 21-50  <3 WBC/hpf   RBC / HPF TOO NUMEROUS TO COUNT  <3 RBC/hpf   Bacteria, UA MANY (*) RARE   Casts HYALINE CASTS (*) NEGATIVE   Urine-Other MUCOUS PRESENT      Dg Chest Port 1 View 12/17/2011  *RADIOLOGY REPORT*  Clinical Data: Cough, shortness of breath and chest pain. History of metastatic uterine leiomyosarcoma.  PORTABLE CHEST - 1 VIEW  Comparison: 12/13/2011  Findings: Stable bilateral pulmonary metastatic lesions.  Stable positioning of Port-A-Cath.  Lung volumes are low bilaterally.  No evidence of edema or focal pulmonary consolidation.  Heart size is normal.  IMPRESSION: Stable pulmonary metastatic lesions.  Low lung volumes without other acute findings in the chest.   Original Report Authenticated By: Reola Calkins, M.D.    12/13/2011: RADIOLOGY REPORT*  Clinical Data: Shortness of breath, right rib pain, history of cancer  CHEST - 2 VIEW  Comparison: CT chest dated 09/24/2011  Findings: Enlarging left upper lobe mass. Suspected new right upper lobe nodule/mass. Additional lateral left mid/lower lung  nodules. These findings are compatible with progression of malignancy. Elevation of the right hemidiaphragm. No pleural effusion or  pneumothorax. The heart is normal in size. Right chest power port. Degenerative changes of the visualized thoracolumbar spine.  IMPRESSION:  Enlarging left upper lobe mass.  Suspected new right upper lobe nodule/mass and enlarging left mid/lower lung nodules.  Original Report Authenticated By: Charline Bills, M.D.      Results for NIESHIA, LARMON (MRN [id number]) as of 12/17/2011 10:23  Ref. Range 08/17/2011 09:25 09/03/2011 08:28 09/24/2011 11:05 11/13/2011 09:39 12/10/2011 09:02 12/13/2011 10:27 12/17/2011 08:25  Sodium Latest Range: 136-145 mEq/L 140 141 137 137 132 (L) 129 (L) 124 (L)  Chloride Latest Range: 96-112 mEq/L 106 107 100 102 98 96 (L) 91 (L)  CO2 Latest Range:  19-32 mEq/L 23 22 26 26 26 24 21   BUN Latest Range: 7.0-26.0 mg/dL 9 10 7 7  8.0 10.0 23  Creatinine Latest Range: 0.50-1.10 mg/dL 8.11 9.14 7.82 9.56 0.7 0.7 1.22 (H)   Results for AYRIEL, TEXIDOR (MRN [id number]) as of 12/17/2011 10:23  Ref. Range 10/30/2011 11:07 11/13/2011 09:39 12/10/2011 09:02 12/13/2011 10:27 12/17/2011 08:25  Hemoglobin Latest Range: 12.0-15.0 g/dL 8.1 (L) 9.3 (L) 7.8 (L) 10.1 (L) 10.5 (L)  HCT Latest Range: 36.0-46.0 % 25.6 (L) 28.5 (L) 24.5 (L) 31.0 (L) 31.3 (L)     1010:  New hyponatremia since last week.  BUN/Cr elevated from previous.  INR therapeutic.  No  pneumonia on CXR but has increasing pulmonary mets.  +UTI, UC pending.  Will dose cipro given pt's allergies.  Dx testing d/w pt and family.  Questions answered.  Verb understanding, agreeable to admit.  T/C to [facility] Dr. Gwenlyn Perking, case discussed, including:  HPI, pertinent PM/SHx, VS/PE, dx testing, ED course and treatment:  Agreeable to admit, requests to obtain medical bed to team 6.          Laray Anger, DO 16-Jan-2012 [phone]

## 2011-12-17 NOTE — H&P (Signed)
[facility] History and Physical  Jackie Cook NWG:[id number] DOB: Aug 29, 1960 DOA: 12/17/2011  Referring physician: Dr. Clarene Duke PCP: No primary provider on file.   Chief Complaint: abdominal pain, N/V and SOB  HPI: Jackie Cook is a 52 y.o. 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 Patient with recent UTI that has failed treatment as an outpatient with cipro. Endorses decrease PO intake and some N/V. Her abd discomfort is localized in the epigastric area and has worsen with use of antibiotics. Denies CP, fever, chills, hematemesis, melena or any other acute complaints.  ED workup also demonstrated ARF, leukocytosis, elevated BNP. [facility] has been called to admit patient for further evaluation and treatment.   Review of Systems: The patient denies anorexia, fever, weight loss,, vision loss, decreased hearing, hoarseness, chest pain, syncope, dyspnea on exertion, peripheral edema, balance deficits, hemoptysis, abdominal pain, melena, hematochezia, severe indigestion/heartburn, hematuria, incontinence, genital sores, muscle weakness, suspicious skin lesions, transient blindness, difficulty walking, depression, unusual weight change, abnormal bleeding, enlarged lymph nodes, angioedema, and breast masses.    Past Medical History  Diagnosis Date  . Anemia     Iron deficiency/prbc's  . H/O hiatal hernia   . H/O menorrhagia   . DDD (degenerative disc disease)   . History of chemotherapy     taxotere/gemzar  . DVT (deep venous thrombosis)     RLE on coumadin  . UTI (lower urinary tract infection)   . Obesity   . DNR (do not resuscitate)     per patient request  . History of chemotherapy     taxotere/gemzar  . Blood transfusion 2001, 09/28/11    2 units PRBCs 09/28/11 hgb 8.6  . History of radiation therapy 10/16/11-10/31/11    30Gy in  34fxs,pelvis/low abdomen  . 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 Metastatic cancer     liver/lung/spleen/mediastinal/hilar lymph nodes  . Lung cancer    Past Surgical History  Procedure Date  . Total abdominal hysterectomy w/ bilateral salpingoophorectomy 05/22/11  . Laparotomy 05/22/2011    Procedure: EXPLORATORY LAPAROTOMY;  Surgeon: Laurette Schimke, MD PHD;  Location: [facility];  Service: Gynecology;  Laterality: N/A;  . Abdominal hysterectomy 05/22/2011    Procedure: HYSTERECTOMY ABDOMINAL;  Surgeon: Laurette Schimke, MD PHD;  Location: [facility];  Service: Gynecology;  Laterality: N/A;  with Resection of Pelvic Mass    . Salpingoophorectomy 05/22/2011    Procedure: SALPINGO OOPHERECTOMY;  Surgeon: Laurette Schimke, MD PHD;  Location: [facility];  Service: Gynecology;  Laterality: Bilateral;  . Portacath placement 06/18/2011    Dr. Mervyn Skeeters. Hoss, 8 Fr, Rt. port w/ tip in SVC  . B/l nephrostomy tubes 06/13/11   Social History:  reports that she quit smoking about 6 months ago. She has never used smokeless tobacco. She reports that she does not drink alcohol or use illicit drugs.  Allergies  Allergen Reactions  . Aspirin     GI upset  . Morphine And Related Itching  . Penicillins Itching    Family History  Problem Relation Age of Onset  . Breast cancer Maternal Aunt   . Heart attack Other   . Lung cancer Father     Prior to Admission medications   Medication Sig Start Date End Date Taking? Authorizing Provider  ciprofloxacin (CIPRO) 500 MG tablet Take 1 tablet (500 mg total)  by mouth 2 (two) times daily. 12/13/11 12/23/11 Yes Conni Slipper, PA  diphenhydrAMINE (BENADRYL) 25 mg capsule Take 25 mg by mouth every 6 (six) hours as needed. For itching   Yes Historical Provider, MD  docusate sodium (COLACE) 100 MG capsule Take 200 mg by mouth daily.    Yes Historical Provider, MD  fentaNYL (DURAGESIC - DOSED MCG/HR) 100 MCG/HR Place 1 patch (100 mcg total)  onto the skin every 3 (three) days. 12/04/11  Yes Lennis Buzzy Han, MD  ferrous fumarate (HEMOCYTE - 106 MG FE) 325 (106 FE) MG TABS Take 1 tablet (106 mg of iron total) by mouth 2 (two) times daily. 12/10/11  Yes Lennis Buzzy Han, MD  HYDROmorphone (DILAUDID) 4 MG tablet Take 1 tablet (4 mg total) by mouth every 4 (four) hours as needed for pain. 12/10/11  Yes Lennis Buzzy Han, MD  LORazepam (ATIVAN) 0.5 MG tablet Take 1 every 4- 6h prn nausea 12/10/11  Yes Lennis P Livesay, MD  magnesium hydroxide (PHILLIPS MILK OF MAGNESIA) 400 MG/5ML suspension Take 5 mLs by mouth daily as needed. 08/01/11  Yes Historical Provider, MD  metoprolol succinate (TOPROL-XL) 25 MG 24 hr tablet Take 12.5 mg by mouth at bedtime. 1 tablet at bedtime 12/10/11  Yes Lennis Buzzy Han, MD  Multiple Vitamins-Minerals (MULTIVITAMIN PO) Take 1 tablet by mouth daily.   Yes Historical Provider, MD  oxyCODONE-acetaminophen (PERCOCET) 10-325 MG per tablet Take 1 tablet by mouth every 6 (six) hours as needed. For pain   Yes Historical Provider, MD  ranitidine (ZANTAC) 150 MG tablet Take 300 mg by mouth 2 (two) times daily.   Yes Historical Provider, MD  tamoxifen (NOLVADEX) 20 MG tablet Take 1 tablet (20 mg total) by mouth daily. 12/10/11  Yes Lennis Buzzy Han, MD  warfarin (COUMADIN) 5 MG tablet 1 tablet by mouth daily between 4 pm and 6 pm, or as directed 08/17/11  Yes Conni Slipper, PA   Physical Exam: Filed Vitals:   12/17/11 0817 12/17/11 0903 12/17/11 1148 12/17/11 1220  BP: 91/56 98/54 93/56  110/64  Pulse: 117 122 115 117  Temp:    97.9 F (36.6 C)  TempSrc:    Oral  Resp: 18  17 22   Height:    5\' 5"  (1.651 m)  Weight:    109.317 kg (241 lb)  SpO2: 98%  98% 100%     General: mild SOB especially when speaking in full sentences; otherwise NAD  Eyes: PERRLA, anicteric, no nystagmus  ENT: no thrush, no erythema or exudates inside her mouth; dry MM  Neck: supple, no bruits or JVD  Cardiovascular: S! And S2, no rubs or  gallops  Respiratory: decreased BS at bases, no wheezing  Abdomen: obese, mild distended, epigastric and mid abdomen discomfort, positive BS  Skin: no rash or petechiae  Musculoskeletal: no joint swelling or erythema  Psychiatric: stable and appropriate  Neurologic: AAOX3; no focal deficit appreciated; normal finger to nose; CN intact.  Labs on Admission:  Basic Metabolic Panel:  Lab 12/17/11 4540 12/13/11 1027  NA 124* 129*  K 4.5 4.3  CL 91* 96*  CO2 21 24  GLUCOSE 97 76  BUN 23 10.0  CREATININE 1.22* 0.7  CALCIUM 9.6 9.2  MG -- --  PHOS -- --   CBC:  Lab 12/17/11 0825 12/13/11 1027  WBC 26.1* 23.2*  NEUTROABS 23.3* 21.2*  HGB 10.5* 10.1*  HCT 31.3* 31.0*  MCV 74.3* 74.9*  PLT 433* 386   Cardiac Enzymes:  Lab 12/17/11 0826  CKTOTAL --  CKMB --  CKMBINDEX --  TROPONINI <0.30    BNP (last 3 results)  Basename 12/17/11 0826  PROBNP 3666.0*    Radiological Exams on Admission: Dg Chest Port 1 View  12/17/2011  *RADIOLOGY REPORT*  Clinical Data: Cough, shortness of breath and chest pain. History of metastatic uterine leiomyosarcoma.  PORTABLE CHEST - 1 VIEW  Comparison: 12/13/2011  Findings: Stable bilateral pulmonary metastatic lesions.  Stable positioning of Port-A-Cath.  Lung volumes are low bilaterally.  No evidence of edema or focal pulmonary consolidation.  Heart size is normal.  IMPRESSION: Stable pulmonary metastatic lesions.  Low lung volumes without other acute findings in the chest.   Original Report Authenticated By: Reola Calkins, M.D.      Assessment/Plan 1-Dyspnea: no infiltrates or edema on CXR; positive for new metastatic lesion into her lungs; most likely cause of new SOB. Will use IS, supplemental oxygen and because BNP was elevated and patient have decrease BS at bases will check 2-D echo. No lasix will be initiated since she doesn't appeared fluid overload on exam and in fact her UA, hyponatremia and hypochloremia suggest dehydration.  Will provide close I's and O's and daily weight.   2- ARF (acute renal failure): most likely due to UTI and dehydration. Will start tx with levaquin, provide gentle fluid resuscitation and follow Cr trend. CT scan will be order to follow pelvic neoplasia and will help r/o hydronephrosis.  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  4-Malignant neoplasm of body of uterus: will get CT abd and pelvis to help deciding further intervention if any. Oncology will be consulted and will follow their rec's. Patient also will like PC consult for goals of care and to explore about hospice services.  5-Bilateral hydronephrosis: CT of abdomen and pelvis to assess bilat nephrostomy tubes. Continue IV abx's.  6-DVT (deep venous thrombosis): continue coumadin, dose to be adjusted by pharmacy.  7-Dehydration: provide fluid resuscitation and follow clinical response.  8-Leukocytosis: 2/2 #1; continue IV antibiotics and follow WBC's trend  9-Supratherapeutic INR: pharmacy to adjust coumadin; will hold it today. No signs of bleeding.  10-AOCD: status post recent transfusion as an outpatient. Hgb stable. Will monitor.  11-GERD (gastroesophageal reflux disease): started on protonix (most likely associated with obesity and recent cipro PO).  12-Obesity: healthy food habit discussed with patient.  13-Anxiety: continue PRN ativan   Code Status: DNR Family Communication: Mother at bedside Disposition Plan: Patient will like to go home when medically stable  Time spent: >30 minutes  Leiloni Smithers [facility] Pager [phone]  If 7PM-7AM, please contact night-coverage [email or web address] Password [email or web address] 12/17/2011, 2:01 PM

## 2011-12-17 NOTE — ED Notes (Signed)
WUJ:WJXB<JY> Expected date:12/17/11<BR> Expected time: 6:47 AM<BR> Means of arrival:Ambulance<BR> Comments:<BR> Res A: Chest pain, sob, no cardiac hx,  12 ekg unremarkable.

## 2011-12-17 NOTE — Progress Notes (Signed)
ANTICOAGULATION CONSULT NOTE - Initial Consult  Pharmacy Consult for warfarin Indication: DVT  Allergies  Allergen Reactions  . Aspirin     GI upset  . Morphine And Related Itching  . Penicillins Itching    Patient Measurements: Height: 5\' 5"  (165.1 cm) Weight: 241 lb (109.317 kg) IBW/kg (Calculated) : 57   Vital Signs: Temp: 97.9 F (36.6 C) (09/02 1220) Temp src: Oral (09/02 1220) BP: 110/64 mmHg (09/02 1220) Pulse Rate: 117  (09/02 1220)  Labs:  Basename 12/17/11 0826 12/17/11 0825  HGB -- 10.5*  HCT -- 31.3*  PLT -- 433*  APTT -- --  LABPROT -- 43.8*  INR -- 4.55*  HEPARINUNFRC -- --  CREATININE -- 1.22*  CKTOTAL -- --  CKMB -- --  TROPONINI <0.30 --    Estimated Creatinine Clearance: 65.6 ml/min (by C-G formula based on Cr of 1.22).   Medical History: Past Medical History  Diagnosis Date  . Anemia     Iron deficiency/prbc's  . H/O hiatal hernia   . H/O menorrhagia   . DDD (degenerative disc disease)   . History of chemotherapy     taxotere/gemzar  . DVT (deep venous thrombosis)     RLE on coumadin  . UTI (lower urinary tract infection)   . Obesity   . DNR (do not resuscitate)     per patient request  . History of chemotherapy     taxotere/gemzar  . Blood transfusion 2001, 09/28/11    2 units PRBCs 09/28/11 hgb 8.6  . History of radiation therapy 10/16/11-10/31/11    30Gy in 67fxs,pelvis/low abdomen  . 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 Metastatic cancer     liver/lung/spleen/mediastinal/hilar lymph nodes  . Lung cancer     Medications:  Scheduled:    . albuterol  5 mg Nebulization Once  . ciprofloxacin  400 mg Intravenous Once  . docusate sodium  200 mg Oral Daily  . fentaNYL  100 mcg Transdermal Q72H  . ferrous fumarate  1 tablet Oral BID  . levofloxacin (LEVAQUIN) IV  500 mg Intravenous Q24H  . metoprolol tartrate  12.5 mg Oral BID  . multivitamin with minerals  1 tablet Oral  Daily  . pantoprazole  40 mg Oral BID AC  . sodium chloride  250 mL Intravenous Once  . sodium chloride  250 mL Intravenous Once  . tamoxifen  20 mg Oral Daily  . DISCONTD: free water  40 mL Per Tube Q4H   Infusions:    . sodium chloride 75 mL/hr at 12/17/11 1246  . sodium chloride      Assessment:  51 yo female to be admitted with SOB with hx DVT to continue warfarin per pharmacy dosing  Home regimen - 5mg  daily last dose 9/1  INR supratherapeutic  Goal of Therapy:  INR 2-3   Plan:  1) No warfarin today 2) Daily INR   Hessie Knows, PharmD, BCPS Pager [phone] 12/17/2011 2:12 PM

## 2011-12-18 DIAGNOSIS — N179 Acute kidney failure, unspecified: Secondary | ICD-10-CM

## 2011-12-18 DIAGNOSIS — N390 Urinary tract infection, site not specified: Secondary | ICD-10-CM

## 2011-12-18 DIAGNOSIS — D649 Anemia, unspecified: Secondary | ICD-10-CM

## 2011-12-18 DIAGNOSIS — K219 Gastro-esophageal reflux disease without esophagitis: Secondary | ICD-10-CM

## 2011-12-18 DIAGNOSIS — C549 Malignant neoplasm of corpus uteri, unspecified: Secondary | ICD-10-CM

## 2011-12-18 DIAGNOSIS — R531 Weakness: Secondary | ICD-10-CM

## 2011-12-18 DIAGNOSIS — I82409 Acute embolism and thrombosis of unspecified deep veins of unspecified lower extremity: Secondary | ICD-10-CM

## 2011-12-18 DIAGNOSIS — C55 Malignant neoplasm of uterus, part unspecified: Secondary | ICD-10-CM

## 2011-12-18 DIAGNOSIS — R791 Abnormal coagulation profile: Secondary | ICD-10-CM

## 2011-12-18 DIAGNOSIS — N1330 Unspecified hydronephrosis: Secondary | ICD-10-CM

## 2011-12-18 DIAGNOSIS — R109 Unspecified abdominal pain: Secondary | ICD-10-CM

## 2011-12-18 LAB — CBC
HCT: 29.4 % — ABNORMAL LOW (ref 36.0–46.0)
MCH: 24.6 pg — ABNORMAL LOW (ref 26.0–34.0)
MCV: 74.4 fL — ABNORMAL LOW (ref 78.0–100.0)
Platelets: 358 10*3/uL (ref 150–400)
RBC: 3.95 MIL/uL (ref 3.87–5.11)
RDW: 20 % — ABNORMAL HIGH (ref 11.5–15.5)
WBC: 26.1 10*3/uL — ABNORMAL HIGH (ref 4.0–10.5)

## 2011-12-18 LAB — BASIC METABOLIC PANEL
CO2: 21 mEq/L (ref 19–32)
Calcium: 9.1 mg/dL (ref 8.4–10.5)
Chloride: 90 mEq/L — ABNORMAL LOW (ref 96–112)
Creatinine, Ser: 1.51 mg/dL — ABNORMAL HIGH (ref 0.50–1.10)
Glucose, Bld: 58 mg/dL — ABNORMAL LOW (ref 70–99)

## 2011-12-18 MED ORDER — HYDROMORPHONE HCL PF 2 MG/ML IJ SOLN
2.0000 mg | INTRAMUSCULAR | Status: DC | PRN
  Administered 2011-12-18 – 2011-12-19 (×4): 2 mg via INTRAVENOUS
  Filled 2011-12-18 (×4): qty 1

## 2011-12-18 MED ORDER — ALBUTEROL SULFATE (5 MG/ML) 0.5% IN NEBU
2.5000 mg | INHALATION_SOLUTION | Freq: Four times a day (QID) | RESPIRATORY_TRACT | Status: DC | PRN
  Administered 2011-12-19: 2.5 mg via RESPIRATORY_TRACT
  Filled 2011-12-18: qty 0.5

## 2011-12-18 MED ORDER — HYDROMORPHONE HCL PF 1 MG/ML IJ SOLN
INTRAMUSCULAR | Status: AC
  Administered 2011-12-18: 2 mg
  Filled 2011-12-18: qty 2

## 2011-12-18 MED ORDER — HYDROMORPHONE HCL PF 1 MG/ML IJ SOLN
1.0000 mg | INTRAMUSCULAR | Status: DC | PRN
  Administered 2011-12-18: 1 mg via INTRAVENOUS
  Filled 2011-12-18: qty 1

## 2011-12-18 MED ORDER — FENTANYL 75 MCG/HR TD PT72
150.0000 ug | MEDICATED_PATCH | TRANSDERMAL | Status: DC
  Administered 2011-12-18: 150 ug via TRANSDERMAL
  Filled 2011-12-18: qty 2

## 2011-12-18 NOTE — Progress Notes (Signed)
[facility] PROGRESS NOTE  Jackie Cook JYN:[id number] DOB: [birth year] DOA: 12/17/2011 PCP: No primary provider on file.  Assessment/Plan: 1-Dyspnea: no infiltrates or edema on CXR; positive for new metastatic lesion into her lungs; most likely cause of new SOB. Will continue IS, PRN albuterol and supplemental oxygen. At this point no further work up for dyspnea will be initiated; as main problem is uncurable and decision is to focus more into comfort care.  2- ARF (acute renal failure): most likely due to UTI and dehydration; now with some contrast induce nephropathy. No hydronephrosis on CT. Will continue hydration and UTI tx. Patient wants to finish antibiotic therapy as part of her comfort approach.  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   4-Malignant neoplasm of body of uterus: CT demonstrating worsening metastatic disease to her liver, lungs, peritoneum and invading IVC. At this point case discussed with Dr. Darrold Span  And no further chemo or radiation therapy will be pursuit. Will involved PC for hospice and comfort care.  5-Bilateral hydronephrosis: CT of abdomen and pelvis demonstrated no hydronephrosis; to exchanged as schedule by IR 9/18; continue abx's for UTI.  6-DVT (deep venous thrombosis): continue coumadin, dose to be adjusted by pharmacy. INR supratherapeutic (most likely due to abx's)  7-Dehydration: provide fluid resuscitation, comfort feeding and follow clinical response.   8-Leukocytosis: 2/2 #1; continue IV antibiotics.Marland Kitchen  9-Supratherapeutic INR: pharmacy to adjust coumadin; No signs of bleeding.   10-AOCD: status post recent transfusion as an outpatient. Hgb stable. Will monitor. No transfusion needed at this point.  11-GERD (gastroesophageal reflux  disease): Continue protonix   12-Obesity: healthy food habit discussed with patient. Now comfort feeding due to terminal illness.  13-Anxiety: continue PRN ativan   Code Status: DNR Family Communication: no family at bedside during this visit. Disposition Plan: no further chemotherapy or radiation therapy indicated at this point; PC involved and discussing hospice care. Will benefit of residential hospice facility.  Brief narrative: 51 y.o. 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 Patient with recent UTI that has failed treatment as an outpatient with cipro. Endorses decrease PO intake and some N/V.    Consultants:  Oncology  Procedures: CT abdomen: 1. Marked interval progression in widespread metastatic disease to  the lungs, liver, abdominal pelvic lymph nodes and peritoneal  surfaces as described.  2. The large pelvic mass has enlarged and exerts extrinsic mass  effect on the IVC and iliac veins. This may contribute to the  patient's lower extremity swelling. No acute DVT identified.  3. Increased generalized soft tissue edema and mild ascites.    Antibiotics:  Levaquin  HPI/Subjective: Afebrile; no further vomiting; still complaining of Abd pain (RUQ mainly) and also some nausea. No acute CP, no bleeding appreciated.  Objective: Filed Vitals:   12/17/11 2202 12/17/11 2353 12/18/11 0530 12/18/11 0536  BP: 80/50 95/58  100/60  Pulse: 110 100 104   Temp:   97.5 F (36.4 C)   TempSrc:   Oral   Resp:      Height:      Weight:   114.4 kg (252 lb 3.3 oz)   SpO2:   100%     Intake/Output Summary (Last 24 hours)  at 12/18/11 0858 Last data filed at 12/18/11 0445  Gross per 24 hour  Intake 2086.25 ml  Output      1 ml  Net 2085.25 ml   Filed Weights   12/17/11 1220 12/18/11 0530  Weight: 109.317 kg (241 lb) 114.4 kg (252 lb  3.3 oz)    Exam:   General:  Still SOB, and having pain on RUQ and right shoulder  Cardiovascular: mild tachycardia, no rubs or gallops  Respiratory:decreased BS at bases, no wheezing; positive scattered rhonchi  Abdomen: soft, obese/distended, no guarding; positive BS  Neuro: no focal deficit  Data Reviewed: Basic Metabolic Panel:  Lab 12/18/11 1610 12/17/11 0825 12/13/11 1027  NA 122* 124* 129*  K 4.9 4.5 4.3  CL 90* 91* 96*  CO2 21 21 24   GLUCOSE 58* 97 76  BUN 27* 23 10.0  CREATININE 1.51* 1.22* 0.7  CALCIUM 9.1 9.6 9.2  MG -- -- --  PHOS -- -- --   CBC:  Lab 12/18/11 0421 12/17/11 0825 12/13/11 1027  WBC 26.1* 26.1* 23.2*  NEUTROABS -- 23.3* 21.2*  HGB 9.7* 10.5* 10.1*  HCT 29.4* 31.3* 31.0*  MCV 74.4* 74.3* 74.9*  PLT 358 433* 386   Cardiac Enzymes:  Lab 12/17/11 0826  CKTOTAL --  CKMB --  CKMBINDEX --  TROPONINI <0.30   BNP (last 3 results)  Basename 12/17/11 0826  PROBNP 3666.0*     Recent Results (from the past 240 hour(s))  URINE CULTURE     Status: Normal   Collection Time   12/13/11 12:40 PM      Component Value Range Status Comment   Urine Culture, Routine Culture, Urine   Final      Studies: Dg Chest 2 View  12/13/2011  *RADIOLOGY REPORT*  Clinical Data: Shortness of breath, right rib pain, history of cancer  CHEST - 2 VIEW  Comparison: CT chest dated 09/24/2011  Findings: Enlarging left upper lobe mass.  Suspected new right upper lobe nodule/mass.  Additional lateral left mid/lower lung nodules. These findings are compatible with progression of malignancy.  Elevation of the right hemidiaphragm. No pleural effusion or pneumothorax.  The heart is normal in size.  Right chest power port.  Degenerative changes of the visualized thoracolumbar spine.  IMPRESSION: Enlarging left upper lobe mass.  Suspected new right upper lobe nodule/mass and enlarging left mid/lower lung nodules.   Original Report Authenticated By: Charline Bills, M.D.     Dg Ribs Unilateral Right  12/13/2011  *RADIOLOGY REPORT*  Clinical Data: Right rib pain, history of cancer  RIGHT RIBS - 2 VIEW  Comparison: None.  Findings: No displaced right rib fracture is seen.  Pigtail drainage catheter in the right upper abdomen.  Please see chest radiograph report for new/progressive pulmonary findings.  IMPRESSION: No displaced right rib fracture is seen.   Original Report Authenticated By: Charline Bills, M.D.    US Pelvis Limited  12/13/2011  *RADIOLOGY REPORT*  Clinical Data: Pelvic pain and pressure.  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  Evaluate for urinary retention.  US PELVIS LIMITED  Technique:  Transabdominal ultrasound examination of the pelvis was performed to evaluate the urinary bladder.  Comparison: CT abdomen pelvis 10/03/2011.  Findings: The urinary bladder was not clearly visualized, and there is certainly no evidence for bladder distention.  Cursory evaluation of the kidneys showed no evidence of hydronephrosis.  A small amount of ascites was noted.  IMPRESSION: No evidence of urinary bladder distention.  Small amount of ascites.   Original Report Authenticated By: Arnell Sieving, M.D.    Ct Abdomen Pelvis W Contrast  12/17/2011  *RADIOLOGY REPORT*  Clinical Data: Abdominal pain and distension with lower extremity edema.  Metastatic leiomyosarcoma.  CT ABDOMEN AND PELVIS WITH CONTRAST  Technique:  Multidetector CT imaging of the abdomen and pelvis was performed following the standard protocol during bolus administration of intravenous contrast.  Contrast: OMNIPAQUE IOHEXOL 300 MG/ML  SOLN  Comparison: Prior CT 10/03/2011.  Findings: There has been significant interval enlargement of multiple nodules at both lung bases consistent with metastatic disease.  Largest nodules measure 2.9 cm in the right lower lobe (image #11) and 2.7 cm in the lingula (image 14).  No significant pleural  effusion is present.  There is a right paracardiac adenopathy measuring up to 2.9 cm on image 14.  There is markedly progressive widespread hepatic metastatic disease.  The largest lesion is present posteriorly in the right hepatic lobe measuring 12.1 cm on image 32. At least 50% of the hepatic parenchyma is estimated to be replaced by tumor.  The liver is enlarged.  There is progressive extensive peritoneal and omental tumor associated with a small amount of ascites. There are multiple mildly enlarged mesenteric and retroperitoneal lymph nodes.  The very large complex pelvic mass has also enlarged, measuring approximately 18.0 x 21.4 cm transverse. The bladder appears compressed.  There are probable new small splenic metastases.  There is no adrenal mass.  Bilateral percutaneous nephrostomies remain in place.  The gallbladder and pancreas appear unremarkable.  The pelvic mass exerts significant mass effect on the iliac veins and IVC which are suboptimally opacified.  No acute DVT is identified.  There is no evidence of bowel obstruction.  There is diffuse soft tissue edema throughout the subcutaneous and deep fat.  No osseous metastases are identified.  There is no evidence of pathologic fracture or definite epidural tumor.  IMPRESSION:  1.  Marked interval progression in widespread metastatic disease to the lungs, liver, abdominal pelvic lymph nodes and peritoneal surfaces as described. 2.  The large pelvic mass has enlarged and exerts extrinsic mass effect on the IVC and iliac veins.  This may contribute to the patient's lower extremity swelling.  No acute DVT identified.  3.  Increased generalized soft tissue edema and mild ascites.   Original Report Authenticated By: Gerrianne Scale, M.D.    Ir Nephrostomy Tube Change  11/21/2011  *RADIOLOGY REPORT*  Clinical Data:Uterine leiomyosarcoma with bilateral ureteral obstruction and long-term indwelling percutaneous nephrostomy catheters for decompression.   BILATERAL PERCUTANEOUS NEPHROSTOMY CATHETER EXCHANGE UNDER FLUOROSCOPY  Technique and findings:  The nephrostomy tubes and surrounding skin were prepped with Betadine, draped in usual sterile fashion.  A small amount of contrast was injected through the right nephrostomy catheter to opacify the renal collecting system.  The catheter was cut and exchanged over a 0.035" angiographic wire for a new 10-French pigtail catheter, formed centrally within the collecting system under fluoroscopy.  Contrast injection confirms appropriate positioning.  In a similar fashion, the left nephrostomy catheter was injected, cut, and exchanged for a new 10-French pigtail catheter, formed centrally within the left renal collecting system.  Injection confirms appropriate positioning and patency.  Both catheters were secured externally with O-Prolene and Statlock devices.  The patient tolerated the procedure well, with no immediate complication.  IMPRESSION 1.  Technically successful exchange of bilateral nephrostomy catheters under fluoroscopy  Original Report Authenticated By: Osa Craver, M.D.  Ir Nephrostomy Tube Change  11/21/2011  *RADIOLOGY REPORT*  Clinical Data:Uterine leiomyosarcoma with bilateral ureteral obstruction and long-term indwelling percutaneous nephrostomy catheters for decompression.  BILATERAL PERCUTANEOUS NEPHROSTOMY CATHETER EXCHANGE UNDER FLUOROSCOPY  Technique and findings:  The nephrostomy tubes and surrounding skin were prepped with Betadine, draped in usual sterile fashion.  A small amount of contrast was injected through the right nephrostomy catheter to opacify the renal collecting system.  The catheter was cut and exchanged over a 0.035" angiographic wire for a new 10-French pigtail catheter, formed centrally within the collecting system under fluoroscopy.  Contrast injection confirms appropriate positioning.  In a similar fashion, the left nephrostomy catheter was injected, cut, and exchanged  for a new 10-French pigtail catheter, formed centrally within the left renal collecting system.  Injection confirms appropriate positioning and patency.  Both catheters were secured externally with O-Prolene and Statlock devices.  The patient tolerated the procedure well, with no immediate complication.  IMPRESSION 1.  Technically successful exchange of bilateral nephrostomy catheters under fluoroscopy  Original Report Authenticated By: Osa Craver, M.D.   Dg Chest Port 1 View  12/17/2011  *RADIOLOGY REPORT*  Clinical Data: Cough, shortness of breath and chest pain. History of metastatic uterine leiomyosarcoma.  PORTABLE CHEST - 1 VIEW  Comparison: 12/13/2011  Findings: Stable bilateral pulmonary metastatic lesions.  Stable positioning of Port-A-Cath.  Lung volumes are low bilaterally.  No evidence of edema or focal pulmonary consolidation.  Heart size is normal.  IMPRESSION: Stable pulmonary metastatic lesions.  Low lung volumes without other acute findings in the chest.   Original Report Authenticated By: Reola Calkins, M.D.     Scheduled Meds:   . ciprofloxacin  400 mg Intravenous Once  . docusate sodium  200 mg Oral Daily  . fentaNYL  150 mcg Transdermal Q72H  . levofloxacin (LEVAQUIN) IV  500 mg Intravenous Q24H  . metoprolol tartrate  12.5 mg Oral BID  . multivitamin with minerals  1 tablet Oral Daily  . pantoprazole  40 mg Oral BID  . sodium chloride  250 mL Intravenous Once  . sodium chloride  250 mL Intravenous Once  . Warfarin - Pharmacist Dosing Inpatient   Does not apply q1800  . DISCONTD: fentaNYL  100 mcg Transdermal Q72H  . DISCONTD: fentaNYL  100 mcg Transdermal Q72H  . DISCONTD: ferrous fumarate  1 tablet Oral BID  . DISCONTD: free water  40 mL Per Tube Q4H  . DISCONTD: pantoprazole  40 mg Oral BID AC  . DISCONTD: tamoxifen  20 mg Oral Daily  . DISCONTD: tamoxifen  20 mg Oral Daily   Continuous Infusions:   . sodium chloride 75 mL/hr at 12/17/11 1246  .  sodium chloride      Time spent: > 25 minutes    Faustine Tates  [facility] Pager [phone]. If 8PM-8AM, please contact night-coverage at [email or web address], password [facility] 12/18/2011, 8:58 AM  LOS: 1 day

## 2011-12-18 NOTE — Progress Notes (Signed)
12/18/2011, 8:11 AM  Hospital day: 2 Antibiotics: IV levaquin day 2 Chemotherapy: none  Appreciate notification by hospitalist of admission on 12-17-11, for symptom management and UTI.  Patient known to gyn oncology and medical oncology with metastatic leiomyosarcoma extensively involving pelvis, liver and lung. She presented in Jan 2013 with pain and vaginal bleeding with extensive pelvic involvement by scans then, and went to exploratory laparotomy in early Feb 2013 with supracervical hysterectomy confirming diagnosis. She had 4 cycles of gemzar/taxotere thru late May, but progressed in lung. She was begun on tamoxifen 10-05-11 and had some initial palliation from RT to large tumor area in pelvis/ low abdomen by Dr.Moody 7-2 thru 10-31-11. She also has bilateral PCNs in by IR, last changed 11-21-11, and has had several UTIs thru course. She also had RLE DVT May 2013 and has been on coumadin since then, with INR 2.4 on 12-10-11. She has PAC in. She has needed PRBC transfusions multiple times for symptomatic anemia. Code status is DNR.    Subjective: Still SOB tho better on O2. No chest pain, no cough. No bleeding. Abdominal pain has been worst in RUQ. Cannot get comfortable in hospital bed/ has not tried air overlay mattress which I have requested now. Hospital recliner also not comfortable; home recliner is very comfortable however she is now nearly unable to get out of that chair and requests lift chair at home if possible. Bowels moved 12-16-11. Poor appetite. Not complaining of bladder spasms now.No increased swelling LE. Dozed short times after 2 doses IV dilaudid 1 mg last PM.  We have discussed findings on CT AP done 12-17-11, and CXR. CT shows progression in large pelvic mass, increase in large liver mets and increase in pulmonary nodules seen in lung bases. CXR no obvious change and no pleural effusion. We do not have any reasonable options for systemic chemotherapy; I do not think tamoxifen is helping  enough to continue that particularly as she is having difficulty swallowing pills.I will let Dr Mitzi Hansen know that CT has been repeated, however I doubt any significant improvement possible with further RT. She is very open to Hospice referral, would like to be out of hospital as soon as care can be completed here, but is willing to continue IV antibiotics and pain medication adjustment inpatient if that is best. I have put in referral order to Hospice.   She lives with friend in [place]. Mother generally accompanies to office visits. No insurance. Objective: Vital signs in last 24 hours: Blood pressure 100/60, pulse 104, temperature 97.5 F (36.4 C), temperature source Oral, resp. rate 20, height 5\' 5"  (1.651 m), weight 252 lb 3.3 oz (114.4 kg), last menstrual period 04/09/2011, SpO2 100.00%.   Intake/Output from previous day: 09/02 0701 - 09/03 0700 In: 2086.3 [P.O.:480; I.V.:1506.3; IV Piggyback:100] Out: 1 [Emesis/NG output:1] Intake/Output this shift:    Physical exam: Awake and alert, looks generally ill but NAD in recliner with pillows and [place] O2. Oriented, appropriate conversation. Somewhat pale, not icteric, not cyanotic. PERRL. Lungs diminished BS otherwise clear anteriorly. Heart RRR. Abdomen full, few bowel sounds, soft. LE 1+ edema bilaterally which is stable. Feet warm. Amber urine in both PCN bags. IV via PAC NS at 75, site fine. Moves all extremities in chair.  Lab Results:  Basename 12/18/11 0421 12/17/11 0825  WBC 26.1* 26.1*  HGB 9.7* 10.5*  HCT 29.4* 31.3*  PLT 358 433*   BMET  Basename 12/18/11 0421 12/17/11 0825  NA 122* 124*  K 4.9 4.5  CL 90* 91*  CO2 21 21  GLUCOSE 58* 97  BUN 27* 23  CREATININE 1.51* 1.22*  CALCIUM 9.1 9.6    Studies/Results: Ct Abdomen Pelvis W Contrast  12/17/2011  *RADIOLOGY REPORT*  Clinical Data: Abdominal pain and distension with lower extremity edema.  Metastatic leiomyosarcoma.  CT ABDOMEN AND PELVIS WITH CONTRAST  Technique:   Multidetector CT imaging of the abdomen and pelvis was performed following the standard protocol during bolus administration of intravenous contrast.  Contrast: OMNIPAQUE IOHEXOL 300 MG/ML  SOLN  Comparison: Prior CT 10/03/2011.  Findings: There has been significant interval enlargement of multiple nodules at both lung bases consistent with metastatic disease.  Largest nodules measure 2.9 cm in the right lower lobe (image #11) and 2.7 cm in the lingula (image 14).  No significant pleural effusion is present.  There is a right paracardiac adenopathy measuring up to 2.9 cm on image 14.  There is markedly progressive widespread hepatic metastatic disease.  The largest lesion is present posteriorly in the right hepatic lobe measuring 12.1 cm on image 32. At least 50% of the hepatic parenchyma is estimated to be replaced by tumor.  The liver is enlarged.  There is progressive extensive peritoneal and omental tumor associated with a small amount of ascites. There are multiple mildly enlarged mesenteric and retroperitoneal lymph nodes.  The very large complex pelvic mass has also enlarged, measuring approximately 18.0 x 21.4 cm transverse. The bladder appears compressed.  There are probable new small splenic metastases.  There is no adrenal mass.  Bilateral percutaneous nephrostomies remain in place.  The gallbladder and pancreas appear unremarkable.  The pelvic mass exerts significant mass effect on the iliac veins and IVC which are suboptimally opacified.  No acute DVT is identified.  There is no evidence of bowel obstruction.  There is diffuse soft tissue edema throughout the subcutaneous and deep fat.  No osseous metastases are identified.  There is no evidence of pathologic fracture or definite epidural tumor.  IMPRESSION:  1.  Marked interval progression in widespread metastatic disease to the lungs, liver, abdominal pelvic lymph nodes and peritoneal surfaces as described. 2.  The large pelvic mass has enlarged  and exerts extrinsic mass effect on the IVC and iliac veins.  This may contribute to the patient's lower extremity swelling.  No acute DVT identified.  3.  Increased generalized soft tissue edema and mild ascites.   Original Report Authenticated By: Gerrianne Scale, M.D.    Dg Chest Port 1 View  12/17/2011  *RADIOLOGY REPORT*  Clinical Data: Cough, shortness of breath and chest pain. History of metastatic uterine leiomyosarcoma.  PORTABLE CHEST - 1 VIEW  Comparison: 12/13/2011  Findings: Stable bilateral pulmonary metastatic lesions.  Stable positioning of Port-A-Cath.  Lung volumes are low bilaterally.  No evidence of edema or focal pulmonary consolidation.  Heart size is normal.  IMPRESSION: Stable pulmonary metastatic lesions.  Low lung volumes without other acute findings in the chest.   Original Report Authenticated By: Reola Calkins, M.D.      Assessment/Plan: 1. 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 Agree with comfort care including Hospice assistance. Will need home O2, lift chair if possible, probably BSC etc.She is clearly declining. 2.RLE DVT in May: excessively prolonged INR likely due to liver involvement, coumadin now held. She is certainly at risk for further clots, but risks of anticoagulation now may outweigh that concern  3. Bilateral PCNs  in: scheduled for change by IR 01-02-12 4.electrolyte abnormalities: agree with IVF now 5.DNR patient's request 6.long tobacco 7.PAC in 8. Progressive anemia with rehydration: follow 9.UTI sensitive to levaquin  Discussed with Dr Gwenlyn Perking and hospice liason here now.  Aleks Nawrot P

## 2011-12-18 NOTE — Progress Notes (Signed)
Patient still with c/o significant pain in her left shoulder and sacrum despite IV Dilaudid and increased Fentanyl patch.  Dr. Gwenlyn Perking notified and he increased IV Dilaudid dosage.  Allayne Butcher [facility]  12/18/2011

## 2011-12-18 NOTE — Consult Note (Signed)
Patient Jackie Cook KAMRON PORTEE      DOB: 12-15-1960      UJW:[id number]     Consult Note from the Palliative Medicine Team at [facility]    Consult Requested by: Dr Gwenlyn Perking    PCP: No primary provider on file. Reason for Consultation:Clarification of GOC and options     Phone Number:None   This NP Lorinda Creed reviewed medical records, received report from team, assessed the patient and then meet at the patient's bedside along with her mother Dallas Schimke # [phone] and friend Burnett Harry  to discuss diagnosis prognosis, GOC, EOL wishes disposition and options.  A detailed discussion was had today regarding advanced directives.  Concepts specific to code status, artifical feeding and hydration, continued IV antibiotics and rehospitalization was had.  The difference between a aggressive medical intervention path  and a palliative comfort care path for this patient at this time was had.  Values and goals of care important to patient and family were attempted to be elicited.  Concept of Hospice and Palliative Care were discussed  Natural trajectory and expectations at EOL were discussed.  Questions and concerns addressed.  Hard Choices booklet left for review. Family encouraged to call with questions or concerns.  PMT will continue to support holistically.   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  Code Status: DNR/DNI   2. Scope of Treatment:  Continue present medical treatment plan, no escalation until discharge.    At discharge patient request to:  ---no Iv fluids or Iv antibiotics ---no diagnostics, no labs  ---comfort feeds and fluids as tolerated   3. Symptom Management:   1. Anxiety/Agitation: Ativan as ordered 2. Pain: Dilaudid 2 mg every 3 hours prn/Duragesic transdermal  3. Bowel Regimen: Colace 200 mg as ordered daily 4. Nausea/Vomiting: Zofran 4 mg every 6  prn  All above symptom management as written prior to today's' consult.  Patient does not want to make changes and reports comfort at this time.    4. Psychosocial:  Emotional support offered to patient and family at bedside.  Patient is open to talking with Suzette Battiest Lett- LCSW  With the PMT          Patient Documents Completed or Given: Document Given Completed  Advanced Directives Pkt    MOST    DNR    Gone from My Sight    Hard Choices Yes      Brief HPI: metastatic uterine cancer  to liver, spleen and lymph, no further tx options. Rapid decline-now focusing on comfort   MVH:QIONGEXB, poor appetite    PMH:  Past Medical History  Diagnosis Date  . Anemia     Iron deficiency/prbc's  . H/O hiatal hernia   . H/O menorrhagia   . DDD (degenerative disc disease)   . History of chemotherapy     taxotere/gemzar  . DVT (deep venous thrombosis)     RLE on coumadin  . UTI (lower urinary tract infection)   . Obesity   . DNR (do not resuscitate)     per patient request  . History of chemotherapy     taxotere/gemzar  . Blood transfusion 2001, 09/28/11    2 units PRBCs 09/28/11 hgb 8.6  . History of radiation therapy 10/16/11-10/31/11    30Gy in 71fxs,pelvis/low abdomen  . 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 Metastatic  cancer     liver/lung/spleen/mediastinal/hilar lymph nodes  . Lung cancer      PSH: Past Surgical History  Procedure Date  . Total abdominal hysterectomy w/ bilateral salpingoophorectomy 05/22/11  . Laparotomy 05/22/2011    Procedure: EXPLORATORY LAPAROTOMY;  Surgeon: Laurette Schimke, MD PHD;  Location: [facility];  Service: Gynecology;  Laterality: N/A;  . Abdominal hysterectomy 05/22/2011    Procedure: HYSTERECTOMY ABDOMINAL;  Surgeon: Laurette Schimke, MD PHD;  Location: [facility];  Service: Gynecology;  Laterality: N/A;  with Resection of Pelvic Mass    . Salpingoophorectomy 05/22/2011    Procedure: SALPINGO  OOPHERECTOMY;  Surgeon: Laurette Schimke, MD PHD;  Location: [facility];  Service: Gynecology;  Laterality: Bilateral;  . Portacath placement 06/18/2011    Dr. Mervyn Skeeters. Hoss, 8 Fr, Rt. port w/ tip in SVC  . B/l nephrostomy tubes 06/13/11   I have reviewed the FH and SH and  If appropriate update it with new information. Allergies  Allergen Reactions  . Aspirin     GI upset  . Morphine And Related Itching  . Penicillins Itching   Scheduled Meds:   . docusate sodium  200 mg Oral Daily  . fentaNYL  150 mcg Transdermal Q72H  . levofloxacin (LEVAQUIN) IV  500 mg Intravenous Q24H  . metoprolol tartrate  12.5 mg Oral BID  . multivitamin with minerals  1 tablet Oral Daily  . pantoprazole  40 mg Oral BID  . sodium chloride  250 mL Intravenous Once  . Warfarin - Pharmacist Dosing Inpatient   Does not apply q1800  . DISCONTD: fentaNYL  100 mcg Transdermal Q72H  . DISCONTD: fentaNYL  100 mcg Transdermal Q72H  . DISCONTD: ferrous fumarate  1 tablet Oral BID  . DISCONTD: free water  40 mL Per Tube Q4H  . DISCONTD: pantoprazole  40 mg Oral BID AC  . DISCONTD: tamoxifen  20 mg Oral Daily  . DISCONTD: tamoxifen  20 mg Oral Daily   Continuous Infusions:   . sodium chloride 50 mL/hr at 12/18/11 1016  . sodium chloride     PRN Meds:.acetaminophen, acetaminophen, albuterol, HYDROmorphone (DILAUDID) injection, iohexol, LORazepam, magnesium hydroxide, ondansetron (ZOFRAN) IV, oxyCODONE, oxyCODONE-acetaminophen, DISCONTD:  HYDROmorphone (DILAUDID) injection, DISCONTD: oxyCODONE-acetaminophen    BP 100/60  Pulse 104  Temp 97.5 F (36.4 C) (Oral)  Resp 20  Ht 5\' 5"  (1.651 m)  Wt 114.4 kg (252 lb 3.3 oz)  BMI 41.97 kg/m2  SpO2 100%  LMP 04/09/2011   PPS: 30%   Intake/Output Summary (Last 24 hours) at 12/18/11 1151 Last data filed at 12/18/11 0900  Gross per 24 hour  Intake 2326.25 ml  Output      1 ml  Net 2325.25 ml   LBM: PTA                     Stool Softner:colace  Physical Exam:  General:  ill appearing, NAD HEENT:  Moist mucous membranes, no exudate Chest:   Diminished in bases  CTA CVS: tachycardic Abdomen: obese, NT on gentle palpation, +BS Ext: without edema Neuro:alert and oriented X#, engaged in today's conversation  Labs: CBC    Component Value Date/Time   WBC 26.1* 12/18/2011 0421   WBC 23.2* 12/13/2011 1027   RBC 3.95 12/18/2011 0421   RBC 4.14 12/13/2011 1027   HGB 9.7* 12/18/2011 0421   HGB 10.1* 12/13/2011 1027   HCT 29.4* 12/18/2011 0421   HCT 31.0* 12/13/2011 1027   PLT 358 12/18/2011 0421   PLT  386 12/13/2011 1027   MCV 74.4* 12/18/2011 0421   MCV 74.9* 12/13/2011 1027   MCH 24.6* 12/18/2011 0421   MCH 24.4* 12/13/2011 1027   MCHC 33.0 12/18/2011 0421   MCHC 32.6 12/13/2011 1027   RDW 20.0* 12/18/2011 0421   RDW 19.5* 12/13/2011 1027   LYMPHSABS 0.5* 12/17/2011 0825   LYMPHSABS 0.9 12/13/2011 1027   MONOABS 2.3* 12/17/2011 0825   MONOABS 1.1* 12/13/2011 1027   EOSABS 0.0 12/17/2011 0825   EOSABS 0.0 12/13/2011 1027   BASOSABS 0.0 12/17/2011 0825   BASOSABS 0.0 12/13/2011 1027    BMET    Component Value Date/Time   NA 122* 12/18/2011 0421   NA 129* 12/13/2011 1027   K 4.9 12/18/2011 0421   K 4.3 12/13/2011 1027   CL 90* 12/18/2011 0421   CL 96* 12/13/2011 1027   CO2 21 12/18/2011 0421   CO2 24 12/13/2011 1027   GLUCOSE 58* 12/18/2011 0421   GLUCOSE 76 12/13/2011 1027   BUN 27* 12/18/2011 0421   BUN 10.0 12/13/2011 1027   CREATININE 1.51* 12/18/2011 0421   CREATININE 0.7 12/13/2011 1027   CALCIUM 9.1 12/18/2011 0421   CALCIUM 9.2 12/13/2011 1027   GFRNONAA 38* 12/18/2011 0421   GFRAA 44* 12/18/2011 0421    CMP     Component Value Date/Time   NA 122* 12/18/2011 0421   NA 129* 12/13/2011 1027   K 4.9 12/18/2011 0421   K 4.3 12/13/2011 1027   CL 90* 12/18/2011 0421   CL 96* 12/13/2011 1027   CO2 21 12/18/2011 0421   CO2 24 12/13/2011 1027   GLUCOSE 58* 12/18/2011 0421   GLUCOSE 76 12/13/2011 1027   BUN 27* 12/18/2011 0421   BUN 10.0 12/13/2011 1027   CREATININE 1.51* 12/18/2011 0421   CREATININE 0.7  12/13/2011 1027   CALCIUM 9.1 12/18/2011 0421   CALCIUM 9.2 12/13/2011 1027   PROT 5.4* 12/10/2011 0902   PROT 6.1 11/13/2011 0939   ALBUMIN 2.1* 12/10/2011 0902   ALBUMIN 2.4* 11/13/2011 0939   AST 32 12/10/2011 0902   AST 15 11/13/2011 0939   ALT 14 12/10/2011 0902   ALT 11 11/13/2011 0939   ALKPHOS 243* 12/10/2011 0902   ALKPHOS 116 11/13/2011 0939   BILITOT 0.90 12/10/2011 0902   BILITOT 0.4 11/13/2011 0939   GFRNONAA 38* 12/18/2011 0421   GFRAA 44* 12/18/2011 0421      Time In Time Out Total Time Spent with Patient Total Overall Time  1045 1210 80 min 85 min    Greater than 50%  of this time was spent counseling and coordinating care related to the above assessment and plan.  Dr Gwenlyn Perking aware of above  Lorinda Creed NP  [phone]

## 2011-12-18 NOTE — Progress Notes (Signed)
ANTICOAGULATION CONSULT NOTE - Follow Up Consult  Pharmacy Consult for Warfarin Indication: Hx of DVT/chronic Coumadin  Allergies  Allergen Reactions  . Aspirin     GI upset  . Morphine And Related Itching  . Penicillins Itching   Patient Measurements: Height: 5\' 5"  (165.1 cm) Weight: 252 lb 3.3 oz (114.4 kg) IBW/kg (Calculated) : 57   Vital Signs: Temp: 97.6 F (36.4 C) (09/03 1355) Temp src: Axillary (09/03 1355) BP: 87/48 mmHg (09/03 1355) Pulse Rate: 96  (09/03 1355)  Labs:  Basename 12/18/11 1200 12/18/11 0421 12/17/11 0826 12/17/11 0825  HGB -- 9.7* -- 10.5*  HCT -- 29.4* -- 31.3*  PLT -- 358 -- 433*  APTT -- -- -- --  LABPROT 55.8* -- -- 43.8*  INR 6.21* -- -- 4.55*  HEPARINUNFRC -- -- -- --  CREATININE -- 1.51* -- 1.22*  CKTOTAL -- -- -- --  CKMB -- -- -- --  TROPONINI -- -- <0.30 --    Estimated Creatinine Clearance: 54.4 ml/min (by C-G formula based on Cr of 1.51).   Medications:  Anti-infectives     Start     Dose/Rate Route Frequency Ordered Stop   12/17/11 1430   levofloxacin (LEVAQUIN) IVPB 500 mg        500 mg 100 mL/hr over 60 Minutes Intravenous Every 24 hours 12/17/11 1401     12/17/11 1015   ciprofloxacin (CIPRO) IVPB 400 mg        400 mg 200 mL/hr over 60 Minutes Intravenous  Once 12/17/11 1006 12/17/11 1150          Assessment:  51 yo female with hx Uterine Ca/mets to liver and lungs; admit for SOB secondary to lung mets.  UTI treated with IV Levaquin  Chronic Coumadin for DVT; home dose 5mg  daily (last dose 9/1)  INR elevated, likely due to drug interaction with Levaquin.  INR 6.21 today; increased from admission.  Goal of Therapy:  INR 2-3   Plan:  No Coumadin today Ordered daily PT/INR Consider Vit K if INR increases further  Otho Bellows PharmD Pager [phone] 12/18/2011,1:56 PM

## 2011-12-18 NOTE — Progress Notes (Signed)
CSW contacted [facility] re:new patient referral. CSW spoke to Barrett Shell, and faxed (f: [phone]) the H&P, progress notes and face sheet as requested. CSW will follow up on bed offer.  Lia Foyer, LCSWA [facility] Clinical Social Worker Contact #: [phone] (PRN)

## 2011-12-18 NOTE — Progress Notes (Signed)
Clinical Social Work Department BRIEF PSYCHOSOCIAL ASSESSMENT 12/18/2011  Patient:  Jackie Cook, Jackie Cook     Account Number:  [id number]     Admit date:  12/17/2011  Clinical Social Worker: Lia Foyer, [place]  Date/Time:  12/18/2011 01:01 PM  Referred by:  Physician  Date Referred:  12/18/2011 Referred for  Residential hospice placement   Other Referral:   Interview type:  Family Other interview type:    PSYCHOSOCIAL DATA Living Status:  ALONE   Primary support name:  Jackie Cook Primary support relationship to patient:  PARENT Degree of support available:   Strong and vested.    CURRENT CONCERNS Current Concerns  Post-Acute Placement   Other Concerns:    SOCIAL WORK ASSESSMENT / PLAN CSW consulted by MD re: residential hospice placement. CSW contacted the patient's HCPOA, her mother, Jackie Cook ([phone]) due to the patient's altered mental status. CSW provided supportive counseling regarding the patient's medical status. CSW provided information on Hospice facilities, and the mother stated she would like her daughter to go to [facility]. CSW encouraged the mother to think of another hospice home if there are not beds available. The patient's mother stated she would if, [facility] was unavailable. Patient's mother is agreeable to the d/c plan, and thanked the CSW for facilitating this process.   Assessment/plan status:  Information/Referral to [facility] Other assessment/ plan:   Information/referral to community resources:    PATIENT'S/FAMILY'S RESPONSE TO PLAN OF CARE: Patient's mother is agreeable to the d/c plan, and thanked the CSW for facilitating this process.    Lia Foyer, LCSWA [facility] Clinical Social Worker Contact #: [phone] (PRN)

## 2011-12-18 NOTE — Progress Notes (Signed)
CRITICAL VALUE ALERT  Critical value received:  PT 55.8 and INR 6.21  Date of notification:  12/18/2011   Time of notification:  1200  Critical value read back:yes  Nurse who received alert:  Elease Etienne   MD notified (1st page):  Dr. Gwenlyn Perking  Time of first page:  1200    MD notified (2nd page):  Time of second page:  Responding MD:  Dr. Gwenlyn Perking  Time MD responded:  [phone]

## 2011-12-18 NOTE — Progress Notes (Signed)
Palliative Medicine Team SW Referred for psychosocial support, met with pt and mtr in room. Pt very forthcoming about her fears related to dying and the thought of leaving her primary supports, (mtr and sis) behind. Facilitated discussion between mtr and pt about past hurts and family dynamics, as well as pt's current appreciation for the support her Mtr has provided during her illness. Family report pt's sister is on her way back from vacation; family preparing themselves to inform sister of pt's plan to pursue hospice options. Extensive discussion with pt and mtr about anticipatory grief, highlighted the strengths they find in humor and their [place] faith, and affirmed their coping thus far. Family made aware of chaplain availability and location of [facility].  Family appreciative of visit, open to continued follow up, have PMT team phone as needs arise.   Kennieth Francois, [facility] Team phone [phone]

## 2011-12-19 DIAGNOSIS — R0609 Other forms of dyspnea: Secondary | ICD-10-CM

## 2011-12-19 DIAGNOSIS — D62 Acute posthemorrhagic anemia: Secondary | ICD-10-CM

## 2011-12-19 DIAGNOSIS — R5383 Other fatigue: Secondary | ICD-10-CM

## 2011-12-19 LAB — BASIC METABOLIC PANEL
BUN: 34 mg/dL — ABNORMAL HIGH (ref 6–23)
CO2: 20 mEq/L (ref 19–32)
GFR calc non Af Amer: 26 mL/min — ABNORMAL LOW (ref >90)
Glucose, Bld: 64 mg/dL — ABNORMAL LOW (ref 70–99)
Potassium: 5.7 mEq/L — ABNORMAL HIGH (ref 3.5–5.1)
Sodium: 121 mEq/L — ABNORMAL LOW (ref 135–145)

## 2011-12-19 LAB — PROTIME-INR: INR: 6.72 (ref 0.00–1.49)

## 2011-12-19 MED ORDER — WARFARIN SODIUM 2.5 MG PO TABS: 2.5000 mg | ORAL_TABLET | Freq: Every day | ORAL | Status: AC

## 2011-12-19 MED ORDER — LEVOFLOXACIN 500 MG PO TABS
500.0000 mg | ORAL_TABLET | Freq: Every day | ORAL | Status: DC
  Filled 2011-12-19: qty 1

## 2011-12-19 MED ORDER — SODIUM POLYSTYRENE SULFONATE 15 GM/60ML PO SUSP
15.0000 g | Freq: Once | ORAL | Status: DC
  Filled 2011-12-19: qty 60

## 2011-12-19 MED ORDER — OXYCODONE-ACETAMINOPHEN 5-325 MG PO TABS: 1.0000 | ORAL_TABLET | Freq: Four times a day (QID) | ORAL | Status: AC | PRN

## 2011-12-19 MED ORDER — HEPARIN SOD (PORK) LOCK FLUSH 100 UNIT/ML IV SOLN
INTRAVENOUS | Status: AC
  Filled 2011-12-19: qty 5

## 2011-12-19 MED ORDER — LEVOFLOXACIN 500 MG PO TABS: 500.0000 mg | ORAL_TABLET | Freq: Every day | ORAL | Status: DC

## 2011-12-19 MED ORDER — ACETAMINOPHEN 325 MG PO TABS: 650.0000 mg | ORAL_TABLET | Freq: Four times a day (QID) | ORAL | Status: AC | PRN

## 2011-12-19 MED ORDER — PHYTONADIONE 5 MG PO TABS
2.5000 mg | ORAL_TABLET | Freq: Once | ORAL | Status: DC
  Filled 2011-12-19: qty 1

## 2011-12-19 MED ORDER — ALBUTEROL SULFATE (5 MG/ML) 0.5% IN NEBU: 2.5000 mg | INHALATION_SOLUTION | Freq: Four times a day (QID) | RESPIRATORY_TRACT | Status: AC | PRN

## 2011-12-19 NOTE — Progress Notes (Signed)
Progress Note from the Palliative Medicine Team at [facility]  Subjective: patient OOB in chair, uncomfortable with RUQ abdominal pain, dyspneic medication presetnly  -reports she is going to residential today     Objective: Allergies  Allergen Reactions  . Aspirin     GI upset  . Morphine And Related Itching  . Penicillins Itching   Scheduled Meds:   . docusate sodium  200 mg Oral Daily  . fentaNYL  150 mcg Transdermal Q72H  . HYDROmorphone      . levofloxacin  500 mg Oral Daily  . metoprolol tartrate  12.5 mg Oral BID  . multivitamin with minerals  1 tablet Oral Daily  . pantoprazole  40 mg Oral BID  . phytonadione  2.5 mg Oral Once  . sodium polystyrene  15 g Oral Once  . Warfarin - Pharmacist Dosing Inpatient   Does not apply q1800  . DISCONTD: levofloxacin (LEVAQUIN) IV  500 mg Intravenous Q24H   Continuous Infusions:   . sodium chloride 50 mL/hr at 12/18/11 1820   PRN Meds:.acetaminophen, acetaminophen, albuterol, HYDROmorphone (DILAUDID) injection, LORazepam, magnesium hydroxide, ondansetron (ZOFRAN) IV, oxyCODONE, oxyCODONE-acetaminophen, DISCONTD:  HYDROmorphone (DILAUDID) injection  BP 90/52  Pulse 113  Temp 97.4 F (36.3 C) (Axillary)  Resp 24  Ht 5\' 5"  (1.651 m)  Wt 114.4 kg (252 lb 3.3 oz)  BMI 41.97 kg/m2  SpO2 100%  LMP 04/09/2011   PPS: 30% at best Pain Score:10/10 Pain Location RUQ abdominal   Intake/Output Summary (Last 24 hours) at 12/19/11 1017 Last data filed at 12/18/11 1300  Gross per 24 hour  Intake    120 ml  Output      0 ml  Net    120 ml       Physical Exam:  General: ill appearing, generally uncomfortable HEENT:  + temporal muscle wating Chest:   diminished in bases, shallow BS CVS: tachycardic Abdomen:obese RUQ tenderness on palpation Ext: without edema Neuro:alerta and oriented X3  Labs: CBC    Component Value Date/Time   WBC 26.1* 12/18/2011 0421   WBC 23.2* 12/13/2011 1027   RBC 3.95 12/18/2011 0421   RBC 4.14  12/13/2011 1027   HGB 9.7* 12/18/2011 0421   HGB 10.1* 12/13/2011 1027   HCT 29.4* 12/18/2011 0421   HCT 31.0* 12/13/2011 1027   PLT 358 12/18/2011 0421   PLT 386 12/13/2011 1027   MCV 74.4* 12/18/2011 0421   MCV 74.9* 12/13/2011 1027   MCH 24.6* 12/18/2011 0421   MCH 24.4* 12/13/2011 1027   MCHC 33.0 12/18/2011 0421   MCHC 32.6 12/13/2011 1027   RDW 20.0* 12/18/2011 0421   RDW 19.5* 12/13/2011 1027   LYMPHSABS 0.5* 12/17/2011 0825   LYMPHSABS 0.9 12/13/2011 1027   MONOABS 2.3* 12/17/2011 0825   MONOABS 1.1* 12/13/2011 1027   EOSABS 0.0 12/17/2011 0825   EOSABS 0.0 12/13/2011 1027   BASOSABS 0.0 12/17/2011 0825   BASOSABS 0.0 12/13/2011 1027    BMET    Component Value Date/Time   NA 121* 12/19/2011 0335   NA 129* 12/13/2011 1027   K 5.7* 12/19/2011 0335   K 4.3 12/13/2011 1027   CL 87* 12/19/2011 0335   CL 96* 12/13/2011 1027   CO2 20 12/19/2011 0335   CO2 24 12/13/2011 1027   GLUCOSE 64* 12/19/2011 0335   GLUCOSE 76 12/13/2011 1027   BUN 34* 12/19/2011 0335   BUN 10.0 12/13/2011 1027   CREATININE 2.11* 12/19/2011 0335   CREATININE 0.7 12/13/2011 1027  CALCIUM 9.2 12/19/2011 0335   CALCIUM 9.2 12/13/2011 1027   GFRNONAA 26* 12/19/2011 0335   GFRAA 30* 12/19/2011 0335    CMP     Component Value Date/Time   NA 121* 12/19/2011 0335   NA 129* 12/13/2011 1027   K 5.7* 12/19/2011 0335   K 4.3 12/13/2011 1027   CL 87* 12/19/2011 0335   CL 96* 12/13/2011 1027   CO2 20 12/19/2011 0335   CO2 24 12/13/2011 1027   GLUCOSE 64* 12/19/2011 0335   GLUCOSE 76 12/13/2011 1027   BUN 34* 12/19/2011 0335   BUN 10.0 12/13/2011 1027   CREATININE 2.11* 12/19/2011 0335   CREATININE 0.7 12/13/2011 1027   CALCIUM 9.2 12/19/2011 0335   CALCIUM 9.2 12/13/2011 1027   PROT 5.4* 12/10/2011 0902   PROT 6.1 11/13/2011 0939   ALBUMIN 2.1* 12/10/2011 0902   ALBUMIN 2.4* 11/13/2011 0939   AST 32 12/10/2011 0902   AST 15 11/13/2011 0939   ALT 14 12/10/2011 0902   ALT 11 11/13/2011 0939   ALKPHOS 243* 12/10/2011 0902   ALKPHOS 116 11/13/2011 0939   BILITOT 0.90 12/10/2011  0902   BILITOT 0.4 11/13/2011 0939   GFRNONAA 26* 12/19/2011 0335   GFRAA 30* 12/19/2011 0335      Assessment and Plan: 1. Code Status: DNR/DNI-comfort is main focus of care 2. Symptom Control: Pain, Dyspnea, Agitation       Prn medication in place-see orders per attending  Educated patient on use of medications 3. Psycho/Social: Continued emotional support, patient verbalized comfort in decision for full comfort and transition to residential.  Appreciated Suzette Battiest Lett's  visit yesterday 4. Disposition: Anticipates dc today to [facility]  Patient Documents Completed or Given: Document Given Completed  Advanced Directives Pkt    MOST    DNR    Gone from My Sight    Hard Choices yes     Time In Time Out Total Time Spent with Patient Total Overall Time  0900 0925 25 min 25 min    Greater than 50%  of this time was spent counseling and coordinating care related to the above assessment and plan.  Lorinda Creed NP [phone]   1

## 2011-12-19 NOTE — Progress Notes (Signed)
Patient for d/c today to [facility]- Family agreeable to this plan- plan transfer via EMS. Facility confirms bed available and ready for transfer- Reece Levy, MSW, [employer] [phone]

## 2011-12-19 NOTE — Progress Notes (Signed)
ANTICOAGULATION CONSULT NOTE - Follow Up Consult  Pharmacy Consult for Warfarin Indication: Hx of DVT/chronic Coumadin  Allergies  Allergen Reactions  . Aspirin     GI upset  . Morphine And Related Itching  . Penicillins Itching   Patient Measurements: Height: 5\' 5"  (165.1 cm) Weight: 252 lb 3.3 oz (114.4 kg) IBW/kg (Calculated) : 57   Vital Signs: Temp: 97.4 F (36.3 C) (09/04 0609) Temp src: Axillary (09/04 0609) BP: 90/52 mmHg (09/04 0618) Pulse Rate: 113  (09/04 0609)  Labs:  Basename 12/19/11 0335 12/18/11 1200 12/18/11 0421 12/17/11 0826 12/17/11 0825  HGB -- -- 9.7* -- 10.5*  HCT -- -- 29.4* -- 31.3*  PLT -- -- 358 -- 433*  APTT -- -- -- -- --  LABPROT 59.3* 55.8* -- -- 43.8*  INR 6.72* 6.21* -- -- 4.55*  HEPARINUNFRC -- -- -- -- --  CREATININE 2.11* -- 1.51* -- 1.22*  CKTOTAL -- -- -- -- --  CKMB -- -- -- -- --  TROPONINI -- -- -- <0.30 --    Estimated Creatinine Clearance: 38.9 ml/min (by C-G formula based on Cr of 2.11).   Medications:  Anti-infectives     Start     Dose/Rate Route Frequency Ordered Stop   12/17/11 1430   levofloxacin (LEVAQUIN) IVPB 500 mg        500 mg 100 mL/hr over 60 Minutes Intravenous Every 24 hours 12/17/11 1401     12/17/11 1015   ciprofloxacin (CIPRO) IVPB 400 mg        400 mg 200 mL/hr over 60 Minutes Intravenous  Once 12/17/11 1006 12/17/11 1150          Assessment:  51 yo female with hx Uterine Ca/mets to liver and lungs; admit for SOB secondary to lung mets.  UTI treated with IV Levaquin  Chronic Coumadin for DVT; home dose 5mg  daily (last dose 9/1)  INR elevated, possibly in part due to drug interaction with Levaquin.  INR 6.72 today; increased from admission.  Noted vitamin K 2.5 mg ordered for today x 1 (9/4)  Goal of Therapy:  INR 2-3   Plan:  No Coumadin today Daily PT/INR   Tammy Sours, Pharm.D. Clinical Oncology Pharmacist  Pager # [phone]  12/19/2011,7:40 AM

## 2011-12-19 NOTE — Progress Notes (Signed)
criCRITICAL VALUE ALERT  Critical value received:  INR-6.72,PT-59.3  Date of notification:  12/19/2011   Time of notification:0515   Critical value read back:YES  Nurse who received alert:  Jilda Panda  MD notified (1st page):  K.SCHORR,NP  Time of first page:  0530  MD notified (2nd page):  Time of second page:  Responding MD:  Odelia Gage Time MD responded:  [phone]

## 2011-12-19 NOTE — Progress Notes (Signed)
PHARMACIST - PHYSICIAN COMMUNICATION CONCERNING: Antibiotic IV to Oral Route Change Policy  RECOMMENDATION: This patient is receiving Levaquin by the intravenous route.  Based on criteria approved by the Pharmacy and Therapeutics Committee, the antibiotic(s) is/are being converted to the equivalent oral dose form(s).   DESCRIPTION: These criteria include:  Patient being treated for a respiratory tract infection, urinary tract infection, or cellulitis  The patient is not neutropenic and does not exhibit a GI malabsorption state  The patient is eating (either orally or via tube) and/or has been taking other orally administered medications for a least 24 hours  The patient is improving clinically and has a Tmax < 100.5  If you have questions about this conversion, please contact the Pharmacy Department  []   [phone] )  Jeani Hawking []   [phone] )  [facility] Gainer  []   [phone] )  [facility] [x]   [phone] )  [facility]   [place], [place].D. Clinical Oncology Pharmacist  Pager # [phone]

## 2011-12-19 NOTE — Progress Notes (Signed)
Palliative Medicine Team SW Follow up visit with pt and family prior to pt's transfer to residential hospice. Pt reports feeling poorly today, looking forward to transitioning to a more settled location. Mother report's pt's sister plans to meet them over at residential hospice. Mtr thankful for support. No further needs.   Kennieth Francois, [place] Pager [phone]

## 2011-12-19 NOTE — Discharge Summary (Signed)
Physician Discharge Summary  Jackie Cook ZOX:[id number] DOB: June 01, 1960 DOA: 12/17/2011  PCP: No primary provider on file.  Admit date: 12/17/2011 Discharge date: 12/19/2011  Recommendations for Outpatient Follow-up:  1. Please keep checking INR at least every other day and assume Coumadin 2.5 mg at bedtime once INR less than 3  Discharge Diagnoses:  Active Problems:  Malignant neoplasm of body of uterus  ARF (acute renal failure)  Bilateral hydronephrosis  Obesity (BMI 30-39.9)  DVT (deep venous thrombosis)  Dehydration  UTI (urinary tract infection)  Dyspnea  Leukocytosis  Supratherapeutic INR  GERD (gastroesophageal reflux disease)  Weakness generalized  Discharge Condition: Stable medical condition for transfer to hospice today  Diet recommendation: As tolerated  History of present illness:  51 y.o. 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   Assessment/Plan:   Dyspnea:  - no infiltrates or edema on CXR; positive for new metastatic pulmonary lesion  - Continue nebulizers as needed  Acute kidney injury  - most likely due to UTI and dehydration; now with some contrast induce nephropathy. No hydronephrosis on CT. Will continue hydration and UTI tx. Patient wants to finish antibiotic therapy as part of her comfort approach.   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  - no further chemo or radiation therapy will be pursuit. Will involved PC for hospice and comfort care.   Bilateral hydronephrosis:  - CT of abdomen and pelvis demonstrated no hydronephrosis;  - to be exchanged as schedule by IR 9/18  DVT - INR  still supra therapeutic - Vitamin K IV given prior to - Resume Coumadin once INR less than 3, 2.5 mg at bedtime - Please note that there is no sign of active bleed  Supratherapeutic INR:  - Please note that vitamin K IV was given prior to discharge; we have refused Coumadin dosage to 2.5 mg daily at bedtime but please don't start taking Coumadin until INR less than 3  AOCD:  - status post recent transfusion as an outpatient. Hgb stable at 9.7 prior to the admission  GERD (gastroesophageal reflux disease):  - Continue protonix   Obesity:  - Comfort feeds  Anxiety:  - continue PRN ativan   Code Status: DNR  Family Communication: no family at bedside during this visit.  Disposition Plan: no further chemotherapy or radiation therapy indicated at this point; patient will be discharged to hospice today; patient and family informed of this discharge plan, RN also aware of discharge plan today    Consultants:  Oncology  Antibiotics:  Per family request no antibiotics at the time of discharge  Discharge Exam: Filed Vitals:   12/19/11 0618  BP: 90/52  Pulse:   Temp:   Resp:    Filed Vitals:   12/18/11 2203 12/19/11 0609 12/19/11 0618 12/19/11 0929  BP: 97/57 93/48 90/52    Pulse:  113    Temp:  97.4 F (36.3 C)    TempSrc:  Axillary    Resp:      Height:      Weight:      SpO2:  96%  100%    General: Pt is alert, appears ill but in no distress Cardiovascular:  Regular rate and rhythm, S1/S2 + Respiratory: Decreased respiratory sounds, rhonchi appreciated over upper and mid lung lobes Abdominal: Soft, non tender, distended Extremities: +3 lower extremity pitting edema, no cyanosis, pulses palpable bilaterally DP and PT Neuro: Grossly nonfocal  Discharge Instructions  Discharge Orders    Future Appointments: Provider: Department: Dept Phone: Center:   12/31/2011 12:00 PM Windell Hummingbird [facility] [phone] None   12/31/2011 12:15 PM [facility] Flush  Nurse [facility] [phone] None   12/31/2011 12:30 PM [facility] Anti Coag [facility] [phone] None   12/31/2011 1:30 PM [facility] [phone] [place]   01/02/2012 10:30 AM Lennis Buzzy Han, MD [facility] [phone] None   01/02/2012 11:30 AM [facility] [phone] [place]   02/21/2012 9:30 AM Laurette Schimke, MD PHD [facility] [phone] None     Future Orders Please Complete By Expires   Diet - low sodium heart healthy      Increase activity slowly      Call MD for:  persistant nausea and vomiting      Call MD for:  severe uncontrolled pain      Call MD for:  difficulty breathing, headache or visual disturbances      Call MD for:  persistant dizziness or light-headedness        Medication List  As of 12/19/2011 10:05 AM   STOP taking these medications         warfarin 5 MG tablet         TAKE these medications         Coumadin 2.5 mg daily at bedtime   acetaminophen 325 MG tablet   Commonly known as: TYLENOL   Take 2 tablets (650 mg total) by mouth every 6 (six) hours as needed (or Fever >/= 101).      albuterol (5 MG/ML) 0.5% nebulizer solution   Commonly known as: PROVENTIL   Take 0.5 mLs (2.5 mg total) by nebulization every 6 (six) hours as needed for wheezing or shortness of breath.      diphenhydrAMINE 25 mg capsule   Commonly known as: BENADRYL   Take 25 mg by mouth every 6 (six) hours as needed. For itching      docusate sodium 100 MG capsule   Commonly known as: COLACE   Take 200 mg by mouth daily.      fentaNYL 100 MCG/HR   Commonly known as: DURAGESIC - dosed mcg/hr   Place 1 patch (100 mcg total) onto the skin every 3 (three) days.      ferrous fumarate 325 (106 FE) MG Tabs   Commonly known as: HEMOCYTE - 106 mg FE   Take 1 tablet (106 mg of iron total) by mouth 2 (two) times daily.      HYDROmorphone 4 MG tablet   Commonly known as: DILAUDID   Take 1 tablet (4 mg total) by mouth every 4  (four) hours as needed for pain.      LORazepam 0.5 MG tablet   Commonly known as: ATIVAN   Take 1 every 4- 6h prn nausea      metoprolol succinate 25 MG 24 hr tablet   Commonly known as: TOPROL-XL   Take 12.5 mg by mouth at bedtime. 1 tablet at bedtime      MULTIVITAMIN PO   Take 1 tablet by mouth daily.      oxyCODONE-acetaminophen 5-325 MG per tablet   Commonly known as: PERCOCET/ROXICET   Take 1 tablet  by mouth every 6 (six) hours as needed (Take with oxycodone 5 mg PO to = 10 mg oxycodone).      oxyCODONE-acetaminophen 10-325 MG per tablet   Commonly known as: PERCOCET   Take 1 tablet by mouth every 6 (six) hours as needed. For pain      PHILLIPS MILK OF MAGNESIA 400 MG/5ML suspension   Generic drug: magnesium hydroxide   Take 5 mLs by mouth daily as needed.      ranitidine 150 MG tablet   Commonly known as: ZANTAC   Take 300 mg by mouth 2 (two) times daily.      tamoxifen 20 MG tablet   Commonly known as: NOLVADEX   Take 1 tablet (20 mg total) by mouth daily.             The results of significant diagnostics from this hospitalization (including imaging, microbiology, ancillary and laboratory) are listed below for reference.    Significant Diagnostic Studies: Dg Chest 2 View 12/13/2011  * IMPRESSION: Enlarging left upper lobe mass.  Suspected new right upper lobe nodule/mass and enlarging left mid/lower lung nodules.     Dg Ribs Unilateral Right 12/13/2011  * IMPRESSION: No displaced right rib fracture is seen.    US Pelvis Limited 12/13/2011  * IMPRESSION: No evidence of urinary bladder distention.  Small amount of ascites.      Ct Abdomen Pelvis W Contrast 12/17/2011  * IMPRESSION:  1.  Marked interval progression in widespread metastatic disease to the lungs, liver, abdominal pelvic lymph nodes and peritoneal surfaces as described. 2.  The large pelvic mass has enlarged and exerts extrinsic mass effect on the IVC and iliac veins.  This may contribute to the  patient's lower extremity swelling.  No acute DVT identified.  3.  Increased generalized soft tissue edema and mild ascites.     Ir Nephrostomy Tube Change 11/21/2011  *RADIOLOGY REPORT*  Clinical Data:Uterine leiomyosarcoma with bilateral ureteral obstruction and long-term indwelling percutaneous nephrostomy catheters for decompression.  BILATERAL PERCUTANEOUS NEPHROSTOMY CATHETER EXCHANGE UNDER FLUOROSCOPY  Technique and findings:  The nephrostomy tubes and surrounding skin were prepped with Betadine, draped in usual sterile fashion.  A small amount of contrast was injected through the right nephrostomy catheter to opacify the renal collecting system.  The catheter was cut and exchanged over a 0.035" angiographic wire for a new 10-French pigtail catheter, formed centrally within the collecting system under fluoroscopy.  Contrast injection confirms appropriate positioning.  In a similar fashion, the left nephrostomy catheter was injected, cut, and exchanged for a new 10-French pigtail catheter, formed centrally within the left renal collecting system.  Injection confirms appropriate positioning and patency.  Both catheters were secured externally with O-Prolene and Statlock devices.  The patient tolerated the procedure well, with no immediate complication.  IMPRESSION 1.  Technically successful exchange of bilateral nephrostomy catheters under fluoroscopy    Ir Nephrostomy Tube Change 11/21/2011  * IMPRESSION 1.  Technically successful exchange of bilateral nephrostomy catheters under fluoroscopy    Dg Chest Port 1 View 12/17/2011  *IMPRESSION: Stable pulmonary metastatic lesions.  Low lung volumes without other acute findings in the chest.      Microbiology: Recent Results (from the past 240 hour(s))  URINE CULTURE     Status: Normal   Collection Time   12/13/11 12:39 PM      Component Value Range Status Comment   Urine Culture, Routine Culture, Urine   Final   URINE CULTURE  Status: Normal    Collection Time   12/13/11 12:40 PM      Component Value Range Status Comment   Urine Culture, Routine Culture, Urine   Final   URINE CULTURE     Status: Normal (Preliminary result)   Collection Time   12/17/11  8:31 AM      Component Value Range Status Comment   Specimen Description URINE, RANDOM   Final    Special Requests NONE   Final    Culture  Setup Time 12/17/2011 15:34   Final    Colony Count >=100,000 COLONIES/ML   Final    Culture GRAM NEGATIVE RODS   Final    Report Status PENDING   Incomplete      Labs: Basic Metabolic Panel:  Lab 12/19/11 9604 12/18/11 0421 12/17/11 0825 12/13/11 1027  NA 121* 122* 124* 129*  K 5.7* 4.9 4.5 4.3  CL 87* 90* 91* 96*  CO2 20 21 21 24   GLUCOSE 64* 58* 97 76  BUN 34* 27* 23 10.0  CREATININE 2.11* 1.51* 1.22* 0.7  CALCIUM 9.2 9.1 9.6 9.2  MG -- -- -- --  PHOS -- -- -- --   CBC:  Lab 12/18/11 0421 12/17/11 0825 12/13/11 1027  WBC 26.1* 26.1* 23.2*  NEUTROABS -- 23.3* 21.2*  HGB 9.7* 10.5* 10.1*  HCT 29.4* 31.3* 31.0*  MCV 74.4* 74.3* 74.9*  PLT 358 433* 386   Cardiac Enzymes:  Lab 12/17/11 0826  CKTOTAL --  CKMB --  CKMBINDEX --  TROPONINI <0.30   BNP: BNP (last 3 results)  Basename 12/17/11 0826  PROBNP 3666.0*   CBG: No results found for this basename: GLUCAP:5 in the last 168 hours  Time coordinating discharge: Over 30 minutes  Signed:  Manson Passey, MD  [facility] 12/19/2011, 10:05 AM  Pager #: [phone]

## 2011-12-21 ENCOUNTER — Encounter: Payer: Self-pay | Admitting: Oncology

## 2011-12-21 LAB — URINE CULTURE: Colony Count: >=100000

## 2011-12-21 NOTE — Progress Notes (Addendum)
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 She has PAC in, last flushed 11-22-11, bilateral percutaneous nephrostomy tubes by IR (next stent changes planned 01-02-12) and is on coumadin for LE DVT. Last CT AP was 10-03-11. She now has prn follow up with Dr Mitzi Hansen and will see Dr Nelly Rout 02-21-12.  History is of heavy menstrual bleeding x years, then 4 months of continuous vaginal bleeding fall 2012. She presented to ED with pain, with CT AP 05-07-2011 in [facility] with 20 x 16.7 x 16 cm pelvic mass thought to be uterus, with second 10.3x10.7x8.8 cm mass adjacent, an 11 x 7.5 cm nodal mass encasing right iliac vessels and severe right hydronephrosis. She was seen by [facility] 05-11-11, then went to exploratory laparotomy with supracervical hysterectomy at [facility] by Dr.Wendy Nelly Rout on 05-22-11. At surgery she had a 24 cm uterus extending to bilateral pelvic sidewalls with an ~ 15 cm retroperitoneal mass. Pathology [id number]) showed multifocal leiomyosarcoma spanning 17 cm, involving uterus and right adnexa, + LVSI. Postoperatively she was tachycardic which improved with 2 units PRBCs; she also had CT angio chest done during that hospitalization 05-24-11 which was negative for PE but did show multiple bilateral pulmonary nodules radiographically consistent with metastases. She was seen in follow up by Dr.Brewster at clinic on 05-31-2011, with increased swelling of right thigh and right back pain; venous doppler was negative for clot RLE. She had bilateral ureteral obstruction by 06-12-2011, with creatinine up to 3.76, with bilateral PCNs placed by IR. She had PAC placed by IR. She began gemzar/taxotere 06-25-11, day 1 day 8 q 21 days with neulasta support. She has required PRBC  transfusions on several occasions thru this course and had IV iron on 08-06-11. She had new RLE DVT documented 08-17-11. Restaging CT 08-20-11 had fairly stable disease in chest and still significant involvement in pelvis.  Cycle 5 was to be gemzar only due to LE swelling possibly exacerbated by taxotere, however day 1 was held on 09-24-11 due to asymptomatic heart rate 150 - 160. She was evaluated in ED with sinus tachycardia vs SVT vs a flutter with 2:1 block. CT angio chest in ED was negative for PE but did show progression of pulmonary mets. Gemzar/ taxotere was discontinued then and tamoxifen + RT begun in further attempt at palliation. She clearly had improvement in symptoms with RT.   Patient presents accompanied by her mother for work in visit complaining of severe pain under her right breast as well as the top of her stomach.she states her current pain medications or not helping. She also complains of having trouble breathing both at rest and worse with walking. She denied any fever or chills. She also reports cloudy urine coming from her left urostomy bag. Current pain medications include fentanyl patch of 100 mcg every 3 days along with the oxycodone tablets every 6 hours and a Dilaudid 4 mg tablet averaging 45 tablets daily.with her complaints of right side pain and difficulty breathing she was sent for a chest x-ray and right rib views prior to being seen this morning. The chest x-ray revealed enlarging left upper lobe mass as well as suspected new right upper lobe nodule/mass enlarging left mid/lower lobe nodules. The x-ray of the ribs revealed no displaced right rib fracture.she does have known liver metastasis and this was  read as worse on her April 2013 CT scan. She denied fever, gross hematuria or other bleeding. She continues to be  more tired and more SOB with activity. Pain management is her primary issue today. Remainder of 10 point Review of Systems negative.  Objective:  Vital signs in  last 24 hours:  BP 107/69  Pulse 102  Temp 97.7 F (36.5 C) (Oral)  Resp 22  Ht 5' 5.5" (1.664 m)  Wt 244 lb 9.6 oz (110.95 kg)  BMI 40.08 kg/m2  LMP 04/09/2011 this weight is down 7 lbs from July. Ambulatory slowly, pale, not icteric, alert and appropriate, respirations not labored at rest RA.  HEENT:PERRLA, sclera clear, anicteric, oropharynx clear, no lesions and neck supple with midline trachea. Hair is growing back. No evidence of thrush or mucositis LymphaticsCervical, supraclavicular, and axillary nodes normal. Resp: clear to auscultation bilaterally and normal percussion bilaterally Cardio: regular rate and rhythm GI: obese, distended, soft, some bowel sounds. Cannot appreciate HSM or mass. Point tenderness in the right upper quadrant, no rebound or referred pain Back with PCNs dressed, not tender Extremities: bilateral LE edema R>L tight in lower legs, no clear cords. Neuro:no sensory deficits noted Skin without rash including right lower abdomen Portacath-without erythema or tenderness Observation of the urine and the urostomy bag reveals a very cloudy urine in the left urostomy bag with medium yellow urine although small amount in the right urostomy bag  Lab Results:  Results for orders placed in visit on 12/13/11  URINALYSIS, MICROSCOPIC - [facility]      Component Value Range   Glucose Negative  Negative g/dL   Bilirubin (Urine) Negative  Negative   Ketones Negative  Negative mg/dL   Specific Gravity, Urine 1.010  1.003 - 1.035   Blood Negative  Negative   pH 7.0  4.6 - 8.0   Protein 100  Negative- <30 mg/dL   Nitrite Negative  Negative   Leukocyte Esterase Moderate  Negative   RBC / HPF 0-2  0 - 2   WBC, UA 21-50  0 - 2   Bacteria, UA Many  Negative- Trace   Epithelial Cells Occasional  Negative- Few  URINE CULTURE      Component Value Range   Urine Culture, Routine Culture, Urine    URINALYSIS, MICROSCOPIC - [facility]      Component Value Range   Glucose Negative   Negative g/dL   Bilirubin (Urine) Negative  Negative   Ketones 15  Negative mg/dL   Specific Gravity, Urine 1.010  1.003 - 1.035   Blood Moderate  Negative   pH 6.5  4.6 - 8.0   Protein 100  Negative- <30 mg/dL   Nitrite Positive  Negative   Leukocyte Esterase Small  Negative   RBC / HPF 3-6  0 - 2   WBC, UA 7-10  0 - 2   Bacteria, UA Moderate  Negative- Trace   Amorphous, UA Large  Negative- Small  URINE CULTURE      Component Value Range   Urine Culture, Routine Culture, Urine     Patient agrees to PRBCs, with 2 units to be given at [facility] today.  Studies/Results: Dg Chest 2 View  12/13/2011  *RADIOLOGY REPORT*  Clinical Data: Shortness of breath, right rib pain, history of cancer  CHEST - 2 VIEW  Comparison: CT chest dated 09/24/2011  Findings: Enlarging left upper lobe mass.  Suspected new right upper lobe nodule/mass.  Additional lateral left mid/lower lung nodules. These findings are compatible  with progression of malignancy.  Elevation of the right hemidiaphragm. No pleural effusion or pneumothorax.  The heart is normal in size.  Right chest power port.  Degenerative changes of the visualized thoracolumbar spine.  IMPRESSION: Enlarging left upper lobe mass.  Suspected new right upper lobe nodule/mass and enlarging left mid/lower lung nodules.   Original Report Authenticated By: Charline Bills, M.D.    Dg Ribs Unilateral Right  12/13/2011  *RADIOLOGY REPORT*  Clinical Data: Right rib pain, history of cancer  RIGHT RIBS - 2 VIEW  Comparison: None.  Findings: No displaced right rib fracture is seen.  Pigtail drainage catheter in the right upper abdomen.  Please see chest radiograph report for new/progressive pulmonary findings.  IMPRESSION: No displaced right rib fracture is seen.   Original Report Authenticated By: Charline Bills, M.D.    US Pelvis Limited  12/13/2011  *RADIOLOGY REPORT*  Clinical Data: Pelvic pain and pressure.  Indwelling bilateral nephrostomy catheters due to  obstruction secondary to uterine leiomyosarcoma.  Evaluate for urinary retention.  US PELVIS LIMITED  Technique:  Transabdominal ultrasound examination of the pelvis was performed to evaluate the urinary bladder.  Comparison: CT abdomen pelvis 10/03/2011.  Findings: The urinary bladder was not clearly visualized, and there is certainly no evidence for bladder distention.  Cursory evaluation of the kidneys showed no evidence of hydronephrosis.  A small amount of ascites was noted.  IMPRESSION: No evidence of urinary bladder distention.  Small amount of ascites.   Original Report Authenticated By: Arnell Sieving, M.D.    Ct Abdomen Pelvis W Contrast  12/17/2011  *RADIOLOGY REPORT*  Clinical Data: Abdominal pain and distension with lower extremity edema.  Metastatic leiomyosarcoma.  CT ABDOMEN AND PELVIS WITH CONTRAST  Technique:  Multidetector CT imaging of the abdomen and pelvis was performed following the standard protocol during bolus administration of intravenous contrast.  Contrast: OMNIPAQUE IOHEXOL 300 MG/ML  SOLN  Comparison: Prior CT 10/03/2011.  Findings: There has been significant interval enlargement of multiple nodules at both lung bases consistent with metastatic disease.  Largest nodules measure 2.9 cm in the right lower lobe (image #11) and 2.7 cm in the lingula (image 14).  No significant pleural effusion is present.  There is a right paracardiac adenopathy measuring up to 2.9 cm on image 14.  There is markedly progressive widespread hepatic metastatic disease.  The largest lesion is present posteriorly in the right hepatic lobe measuring 12.1 cm on image 32. At least 50% of the hepatic parenchyma is estimated to be replaced by tumor.  The liver is enlarged.  There is progressive extensive peritoneal and omental tumor associated with a small amount of ascites. There are multiple mildly enlarged mesenteric and retroperitoneal lymph nodes.  The very large complex pelvic mass has also  enlarged, measuring approximately 18.0 x 21.4 cm transverse. The bladder appears compressed.  There are probable new small splenic metastases.  There is no adrenal mass.  Bilateral percutaneous nephrostomies remain in place.  The gallbladder and pancreas appear unremarkable.  The pelvic mass exerts significant mass effect on the iliac veins and IVC which are suboptimally opacified.  No acute DVT is identified.  There is no evidence of bowel obstruction.  There is diffuse soft tissue edema throughout the subcutaneous and deep fat.  No osseous metastases are identified.  There is no evidence of pathologic fracture or definite epidural tumor.  IMPRESSION:  1.  Marked interval progression in widespread metastatic disease to the lungs, liver, abdominal pelvic lymph nodes  and peritoneal surfaces as described. 2.  The large pelvic mass has enlarged and exerts extrinsic mass effect on the IVC and iliac veins.  This may contribute to the patient's lower extremity swelling.  No acute DVT identified.  3.  Increased generalized soft tissue edema and mild ascites.   Original Report Authenticated By: Gerrianne Scale, M.D.    Ir Nephrostomy Tube Change  11/21/2011  *RADIOLOGY REPORT*  Clinical Data:Uterine leiomyosarcoma with bilateral ureteral obstruction and long-term indwelling percutaneous nephrostomy catheters for decompression.  BILATERAL PERCUTANEOUS NEPHROSTOMY CATHETER EXCHANGE UNDER FLUOROSCOPY  Technique and findings:  The nephrostomy tubes and surrounding skin were prepped with Betadine, draped in usual sterile fashion.  A small amount of contrast was injected through the right nephrostomy catheter to opacify the renal collecting system.  The catheter was cut and exchanged over a 0.035" angiographic wire for a new 10-French pigtail catheter, formed centrally within the collecting system under fluoroscopy.  Contrast injection confirms appropriate positioning.  In a similar fashion, the left nephrostomy catheter was  injected, cut, and exchanged for a new 10-French pigtail catheter, formed centrally within the left renal collecting system.  Injection confirms appropriate positioning and patency.  Both catheters were secured externally with O-Prolene and Statlock devices.  The patient tolerated the procedure well, with no immediate complication.  IMPRESSION 1.  Technically successful exchange of bilateral nephrostomy catheters under fluoroscopy  Original Report Authenticated By: Osa Craver, M.D.   Ir Nephrostomy Tube Change  11/21/2011  *RADIOLOGY REPORT*  Clinical Data:Uterine leiomyosarcoma with bilateral ureteral obstruction and long-term indwelling percutaneous nephrostomy catheters for decompression.  BILATERAL PERCUTANEOUS NEPHROSTOMY CATHETER EXCHANGE UNDER FLUOROSCOPY  Technique and findings:  The nephrostomy tubes and surrounding skin were prepped with Betadine, draped in usual sterile fashion.  A small amount of contrast was injected through the right nephrostomy catheter to opacify the renal collecting system.  The catheter was cut and exchanged over a 0.035" angiographic wire for a new 10-French pigtail catheter, formed centrally within the collecting system under fluoroscopy.  Contrast injection confirms appropriate positioning.  In a similar fashion, the left nephrostomy catheter was injected, cut, and exchanged for a new 10-French pigtail catheter, formed centrally within the left renal collecting system.  Injection confirms appropriate positioning and patency.  Both catheters were secured externally with O-Prolene and Statlock devices.  The patient tolerated the procedure well, with no immediate complication.  IMPRESSION 1.  Technically successful exchange of bilateral nephrostomy catheters under fluoroscopy  Original Report Authenticated By: Osa Craver, M.D.   Dg Chest Port 1 View  12/17/2011  *RADIOLOGY REPORT*  Clinical Data: Cough, shortness of breath and chest pain. History of  metastatic uterine leiomyosarcoma.  PORTABLE CHEST - 1 VIEW  Comparison: 12/13/2011  Findings: Stable bilateral pulmonary metastatic lesions.  Stable positioning of Port-A-Cath.  Lung volumes are low bilaterally.  No evidence of edema or focal pulmonary consolidation.  Heart size is normal.  IMPRESSION: Stable pulmonary metastatic lesions.  Low lung volumes without other acute findings in the chest.   Original Report Authenticated By: Reola Calkins, M.D.    Medications: I have reviewed the patient's current medications. The patient was discussed with Dr. Welton Flakes in Dr. Precious Reel absence. We will increase her fentanyl patch to 150 mcg every 3 days. She is given a prescription for Fentanyl 50 mcg patches to add to her current 100 mcg patches. We will leave her prn oxycodone and Dilaudid at their current doses. We will empirically place her on  a 10 day course of Cipro at 500 mg by mouth twice daily for 10 days for suspected urinary tract infection.   The ultrasound of the pelvis that she had performed earlier this morning revealed no evidence of urinary bladder distention. There was a small amount of ascites present. As we further discussed with the patient when she follows up with Dr. Darrold Span as scheduled on 01/02/2012.   Assessment/Plan: 11. Metastatic leiomyosarcoma of uterus: progression in pelvis and lung on taxotere/ gemzar, now on tamoxifen which she is tolerating well.  2.Bilateral percutaneous nephrostomy tubes. Bladder symptoms as above. 3.PAC in  4. tachycardia improved on low dose B blocker  5.RLE DVT continuing coumadin indefinitely. Coumadin clinic following 6.Anemia: multifactorial including chemo, RT, gyn bleeding, nephrostomy tubes, chronic disease. Has required PRBCs several times thru this course and will be transfused again today.  7.NCB, patient's request.  8. Pain management - Right upper quadrant abdominal pain and right rib cage pain likely secondary to patient's know liver  metastatic disease - changes as outlined above, efficacy to be reviewed when patient is seen in follow up by Dr. Darrold Span on 01/02/2012  Patient and mother were in agreement with plan as above.   Tiana Loft E, PA-C   12/21/2011, 9:01 AM

## 2011-12-21 NOTE — Progress Notes (Signed)
At my request, Dr Dorothy Puffer reviewed recent CT and agrees that with extent of disease further RT is not recommended. He fully agrees with Hospice referral that has been made.  Ila Mcgill, MD

## 2011-12-25 ENCOUNTER — Telehealth: Payer: Self-pay | Admitting: *Deleted

## 2011-12-25 NOTE — Telephone Encounter (Signed)
Called Mrs Jackie Cook, to follow up on [facility]'s [facility] last week. Pt was admitted on 09-20-22 and died on Sep 21, 2022 Jan 11, 2012. Mrs Jackie Cook expressed her appreciation for the care provided to Jackie Cook.

## 2011-12-26 ENCOUNTER — Other Ambulatory Visit: Payer: Self-pay | Admitting: Physician Assistant

## 2011-12-31 ENCOUNTER — Ambulatory Visit: Payer: Medicaid Other

## 2011-12-31 ENCOUNTER — Other Ambulatory Visit: Payer: Medicaid Other | Admitting: Lab

## 2011-12-31 ENCOUNTER — Other Ambulatory Visit (HOSPITAL_COMMUNITY): Payer: Medicaid Other

## 2012-01-02 ENCOUNTER — Other Ambulatory Visit (HOSPITAL_COMMUNITY): Payer: Medicaid Other

## 2012-01-02 ENCOUNTER — Other Ambulatory Visit: Payer: Medicaid Other | Admitting: Lab

## 2012-01-02 ENCOUNTER — Ambulatory Visit: Payer: Medicaid Other | Admitting: Oncology

## 2012-01-04 ENCOUNTER — Ambulatory Visit: Payer: Medicaid Other | Admitting: Oncology

## 2012-01-08 NOTE — Consult Note (Signed)
Agree with above 

## 2012-01-15 DEATH — deceased

## 2012-01-25 ENCOUNTER — Other Ambulatory Visit: Payer: Self-pay | Admitting: Oncology

## 2012-01-28 NOTE — Progress Notes (Signed)
This encounter was created in error - please disregard.

## 2012-02-21 ENCOUNTER — Ambulatory Visit: Payer: Medicaid Other | Admitting: Gynecologic Oncology

## 2012-10-03 NOTE — Telephone Encounter (Signed)
e

## 2013-06-04 IMAGING — US US RENAL
1 series · 13 of 25 positions shown · non-contrast
Comparison: CT of the abdomen pelvis - 05/07/2011

CLINICAL DATA: Acute renal failure; history of hysterectomy and
oophorectomy for uterine cancer (05/31/2011).

RENAL/URINARY TRACT ULTRASOUND COMPLETE

[Series 1: us renal · 0.32mm/px · 13 of 34 slices shown]
[im 1/34]
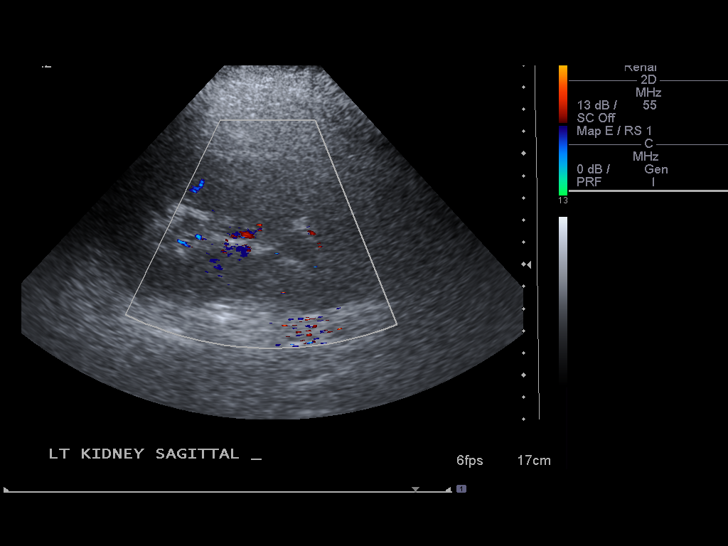
[im 3/34]
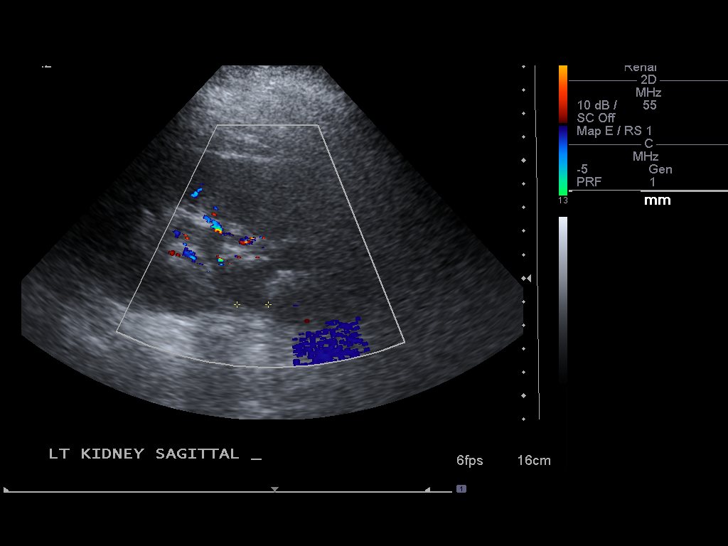
[im 6/34]
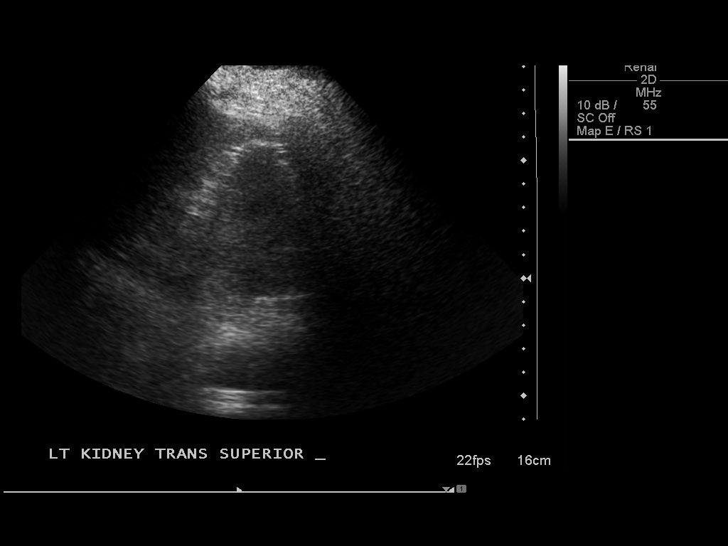
[im 9/34]
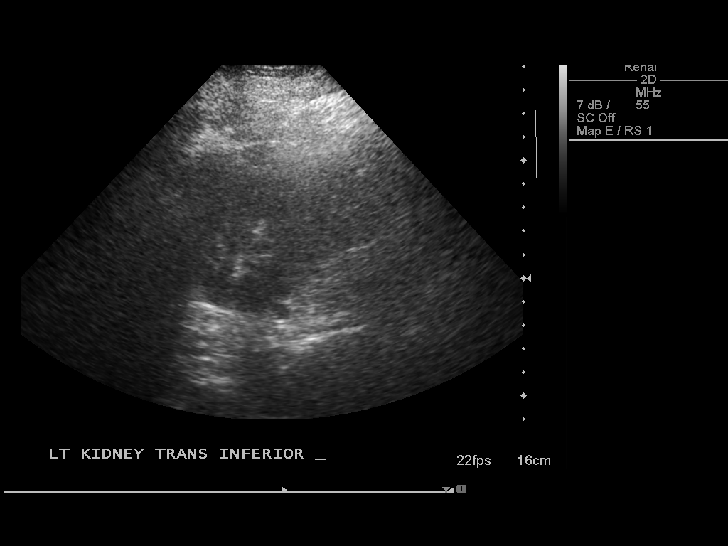
[im 12/34]
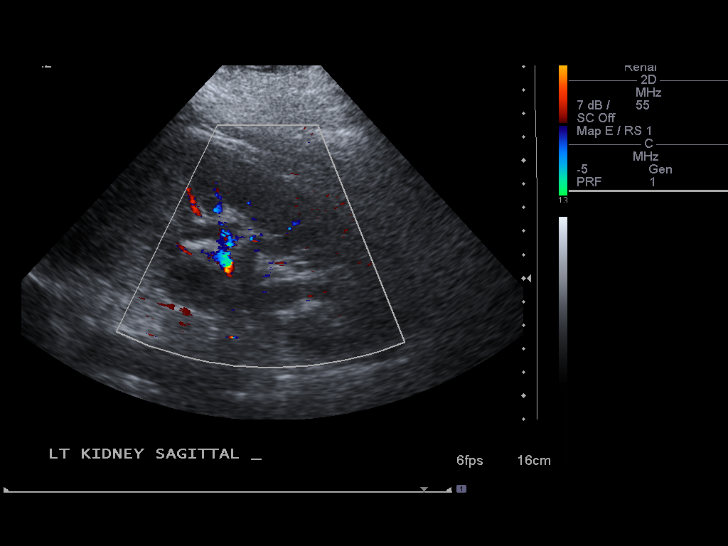
[im 14/34]
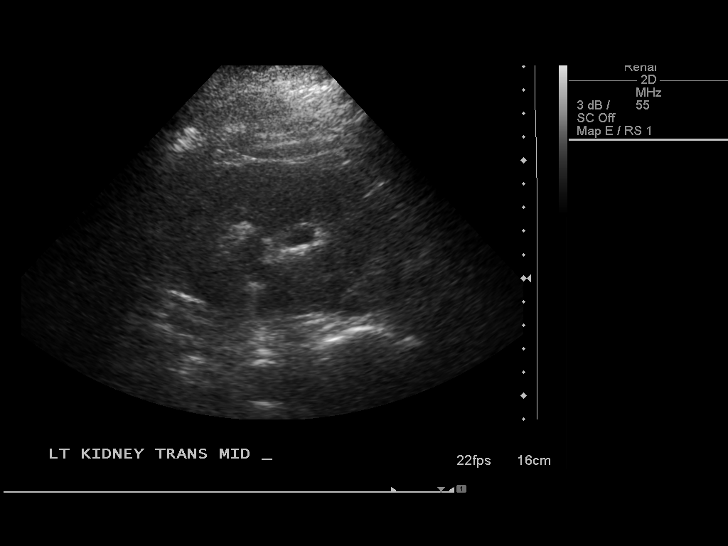
[im 17/34]
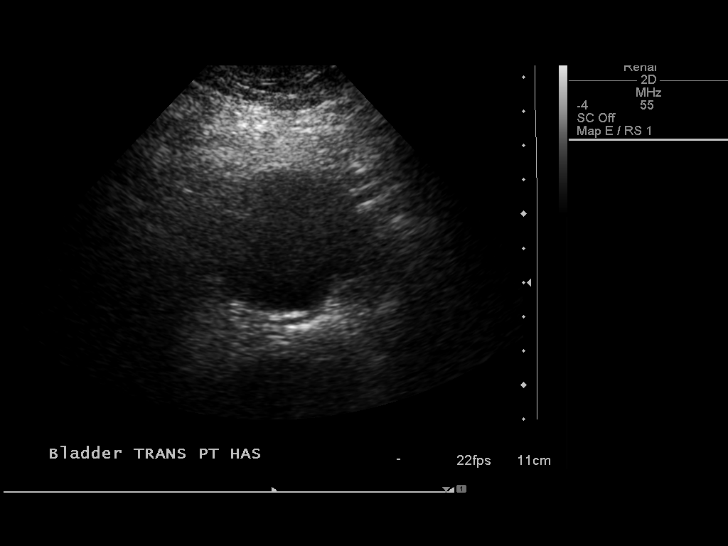
[im 20/34]
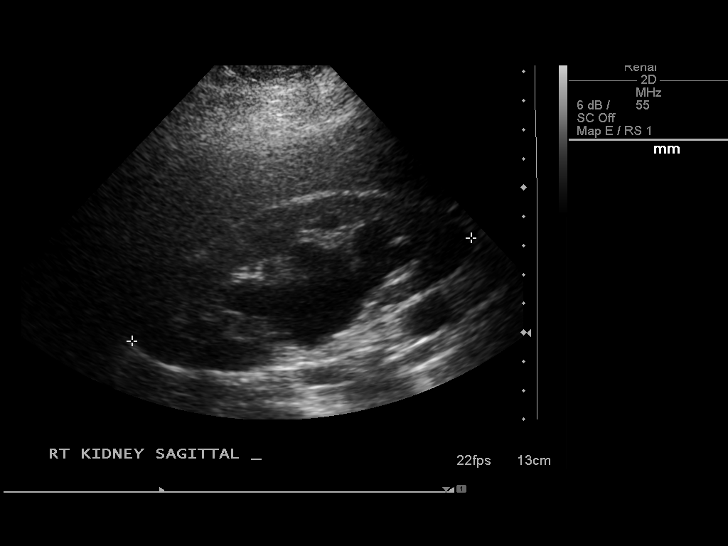
[im 23/34]
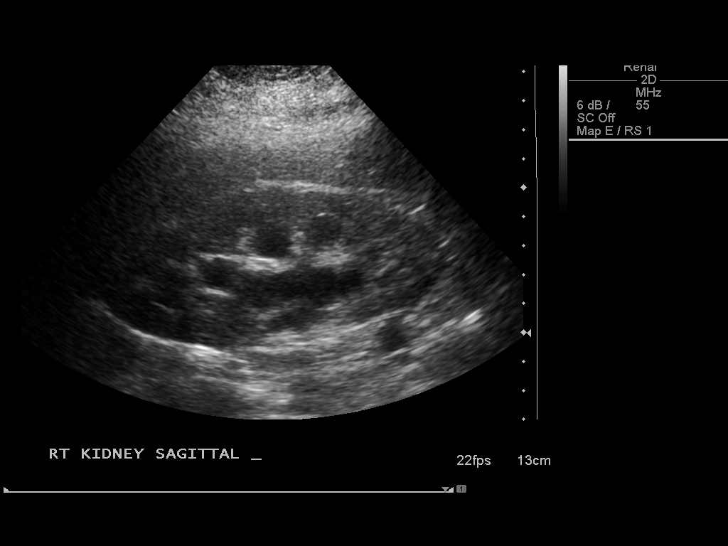
[im 25/34]
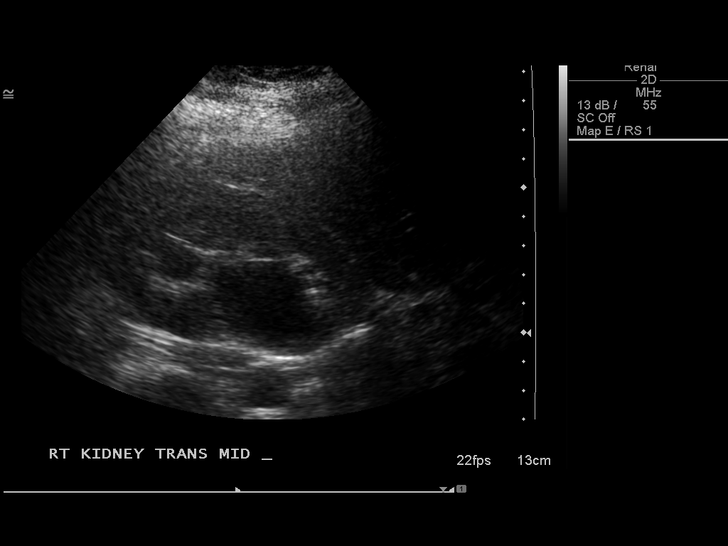
[im 28/34]
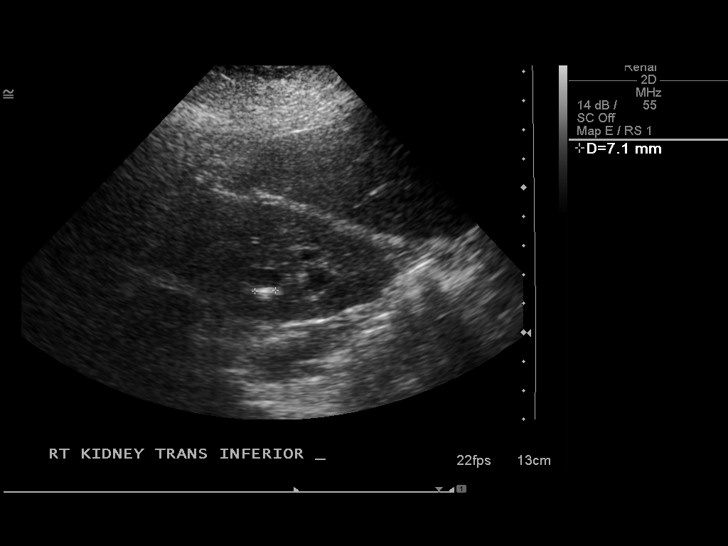
[im 31/34]
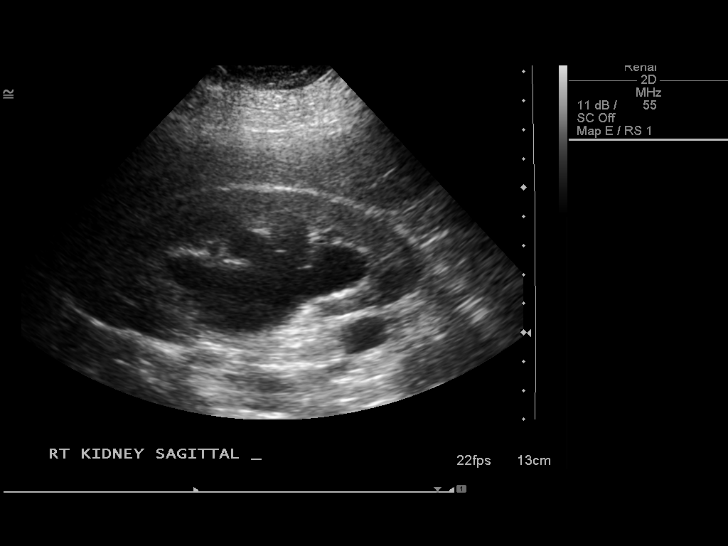
[im 34/34]
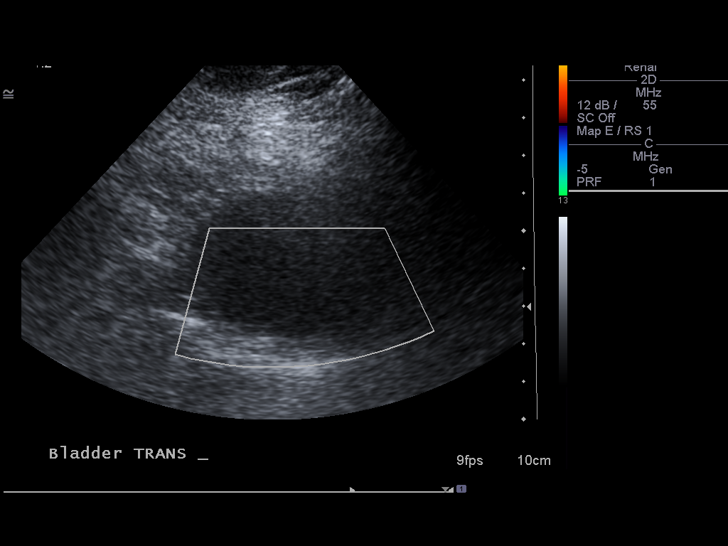

[13 of 25 positions shown; findings below may reference images not displayed]

FINDINGS: Examination is slightly degraded secondary to patient body habitus.

Right Kidney:  The right kidney is normal in size, measuring
cm in length.  Normal cortical echogenicity and thickness.  There
is moderate right-sided hydronephrosis with dilatation of the
visualized aspect of the visualized portion the superior aspect of
the right ureter.  This finding is likely unchanged compared to
prior abdominal CT performed 05/07/2011).  No perinephric
stranding. There is a approximately 7 x 10 mm echogenic likely
nonobstructing renal stone within the inferior aspect of the right
kidney (image 29).

Left Kidney:  Suboptimally visualized secondary to poor sonographic
window.  The left kidney appears grossly normal in size measuring
approximately 13.3 cm in length.  Grossly normal cortical
echogenicity and thickness.  There is mild fullness of the left
renal pelvis and calyces.  No perinephric stranding.  No echogenic
renal stones.

Bladder:  Decompressed with a Foley catheter.
IMPRESSION: 1.  Moderate right-sided pelvocaliectasis and ureterectasis of the
superior aspect of the right ureter, likely unchanged compared to
abdominal CT performed 05/07/2011 and possibly a chronic finding.

2.  Likely nonobstructing 1 cm right-sided renal stone.

3.  Mild fullness of the left sided collecting system suggestive of
mild hydronephrosis.

## 2013-06-05 IMAGING — XA IR PERC NEPHROSTOMY*R*
1 series · 7 of 7 positions shown · non-contrast
Comparison: none

Clinical Data/Indication: RENAL FAILURE.  URETERAL OBSTRUCTION.

PERC NEPHROSTOMY*R*,PERC NEPHROSTOMY*L*,IR ULTRASOUND GUIDANCE
TISSUE ABLATION
Sedation: Versed 4 mg, Fentanyl 200 mg.
Total Moderate Sedation Time: 30 minutes.
Contrast Volume: 30 ml [id number]-[id number].
Additional Medications: Ciprofloxacin 400 mg IV.
Fluoroscopy Time: 1.9 minutes.
Procedure: The procedure, risks, benefits, and alternatives were
explained to the patient. Questions regarding the procedure were
encouraged and answered. The patient understands and consents to
the procedure.
The low back was prepped with betadine in a sterile fashion, and a
sterile drape was applied covering the operative field. A sterile
gown and sterile gloves were used for the procedure.
Under sonographic guidance, a 21 gauge needle was inserted into the
lower pole calix of the left kidney.  Contrast was injected.  Was
removed over a [id number] wire which was upsized to a three J.  10-French
dilator followed by 10-French drain were inserted and coiled in the
renal pelvis.  It was looped and string fixed.  Contrast was
injected.  It was sewn to the skin.  The identical procedure was
performed for the right kidney.

[Series 300: tube placements · 7 of 7 slices shown]
[im 1/7]
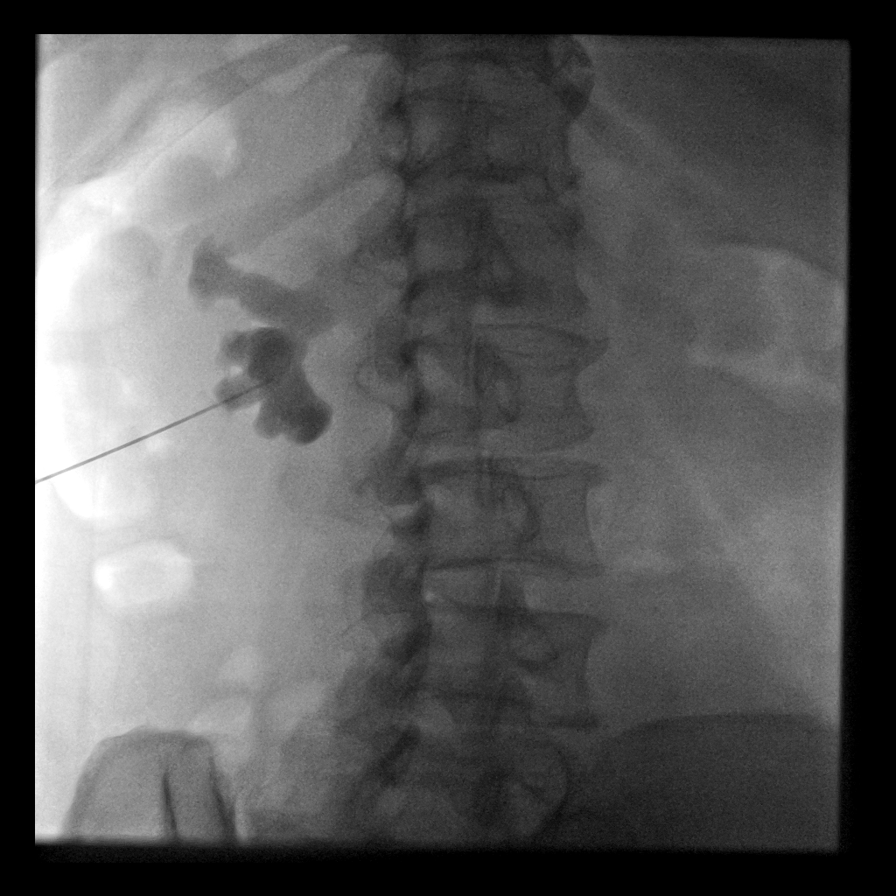
[im 2/7]
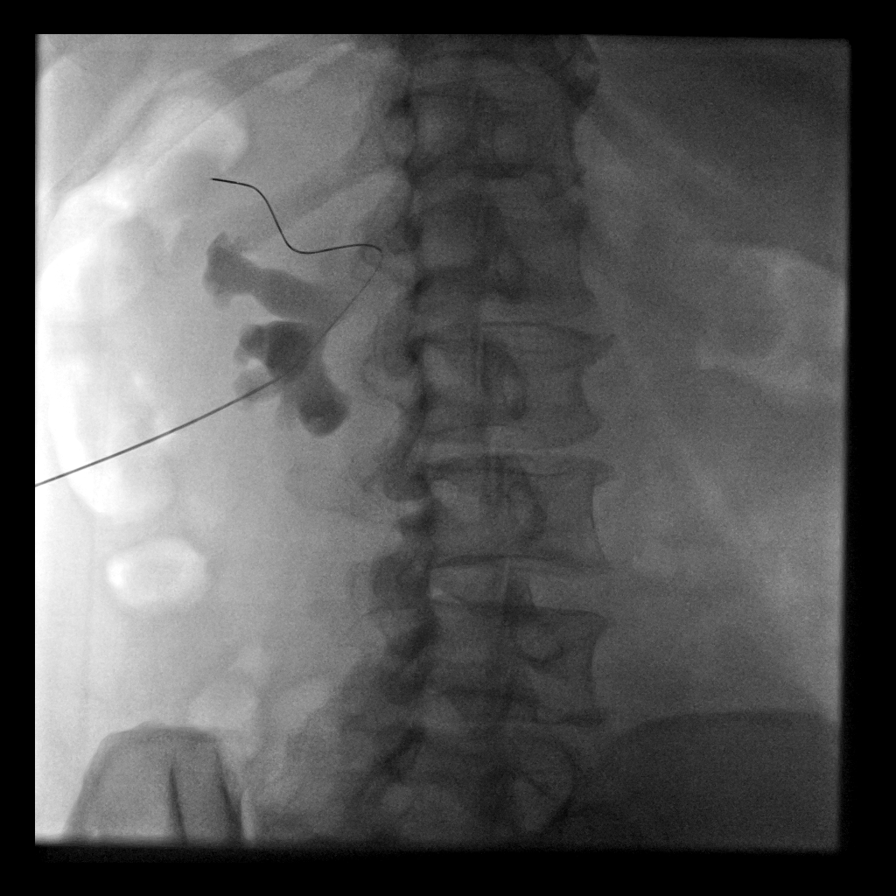
[im 3/7]
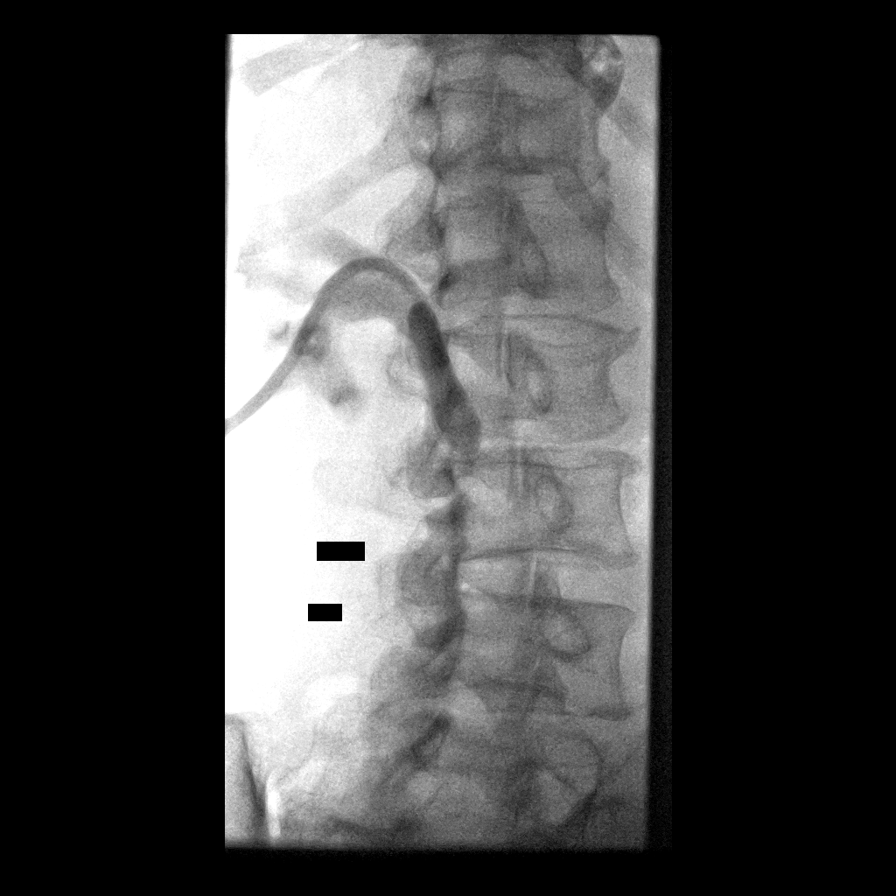
[im 4/7]
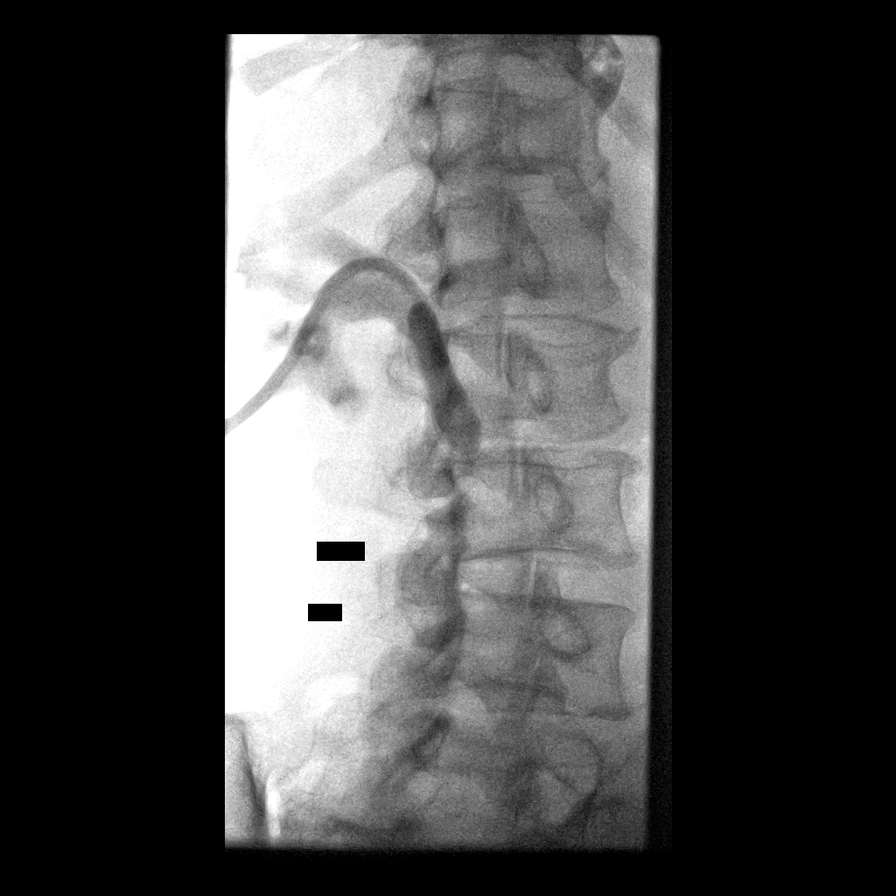
[im 5/7]
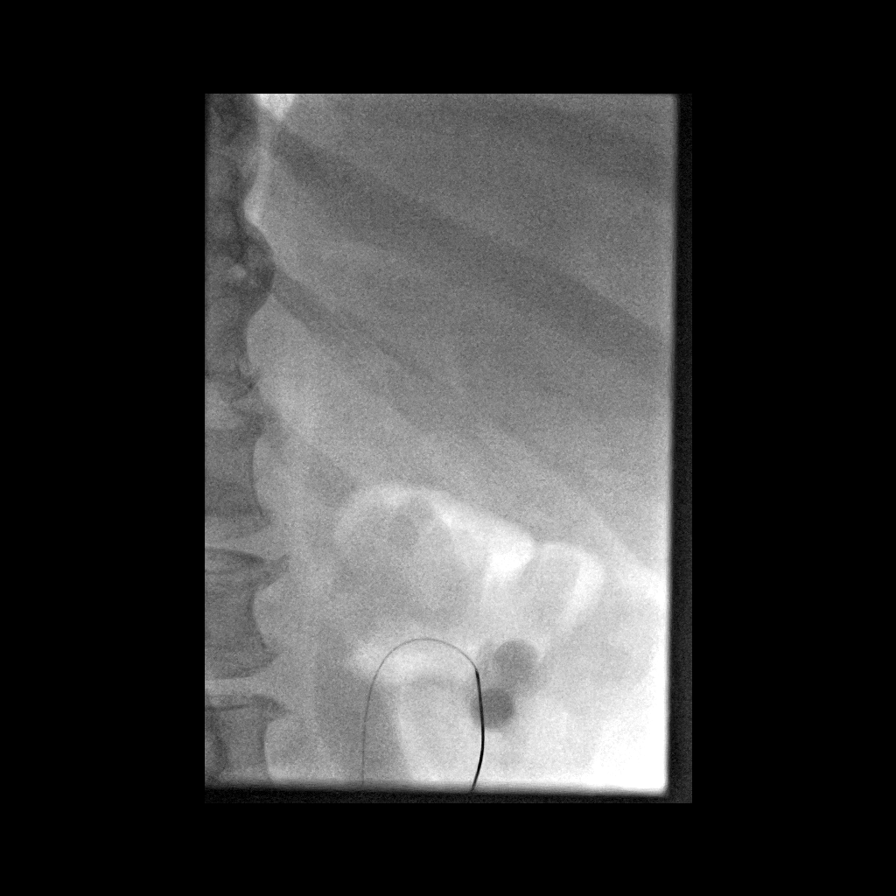
[im 6/7]
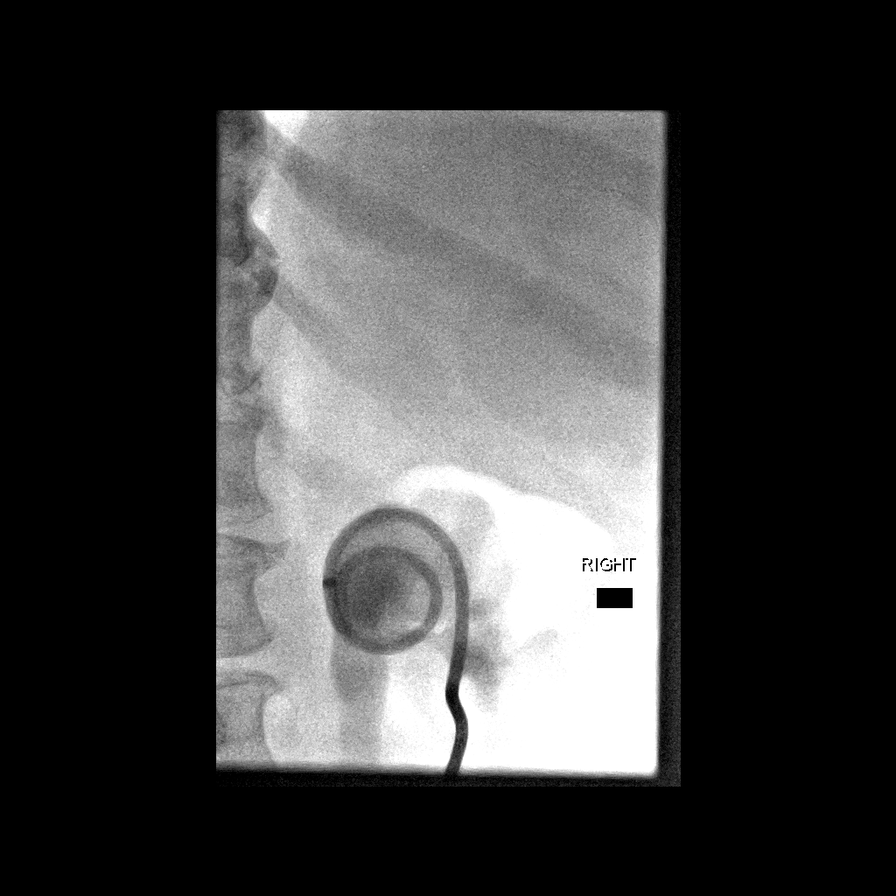
[im 7/7]
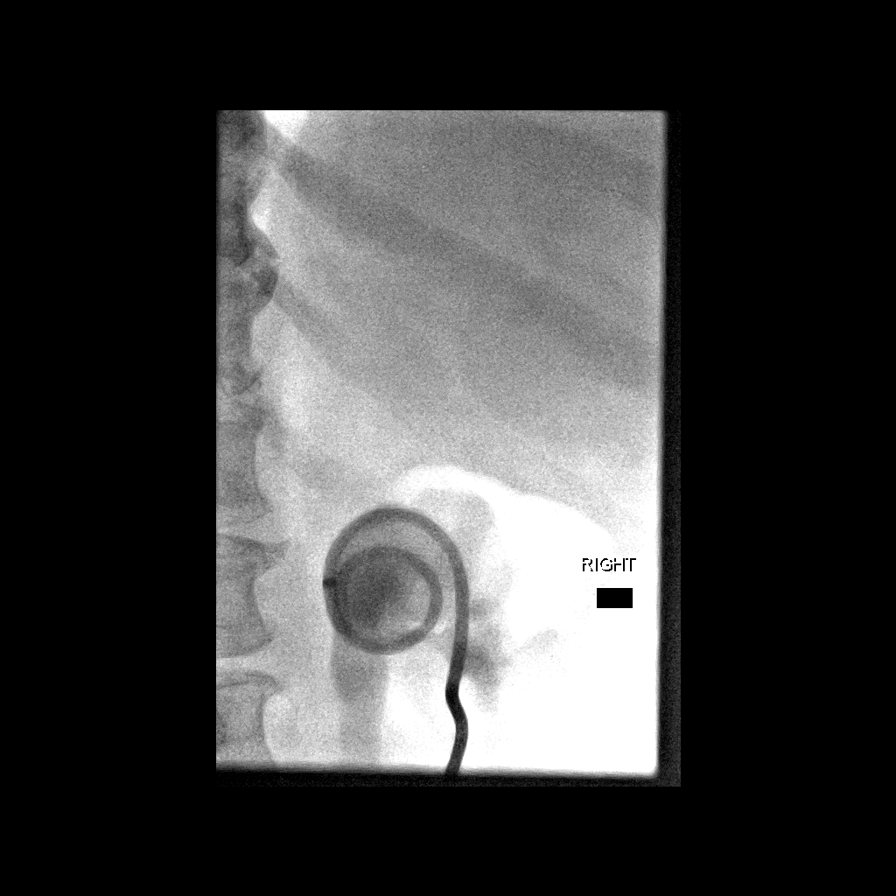

[7 of 7 positions shown; findings below may reference images not displayed]

FINDINGS: Bilateral lower pole renal collecting system access
followed by bilateral percutaneous nephrostomy catheter placement
into the renal pelvis bilaterally is documented.

Complications: None.
IMPRESSION: Bilateral percutaneous nephrostomy catheter placement.

## 2013-07-24 IMAGING — CR DG ABDOMEN 2V
3 series · 3 of 3 positions shown · non-contrast
Comparison: Images obtained during PCN placement 07/25/2011.
Abdominal CT 05/07/2011.

CLINICAL DATA: Abdominal pain.  Bilateral percutaneous
nephrostomies placed earlier this month.  Uterine leiomyosarcoma.

ABDOMEN - 2 VIEW

[w abdomen upright]
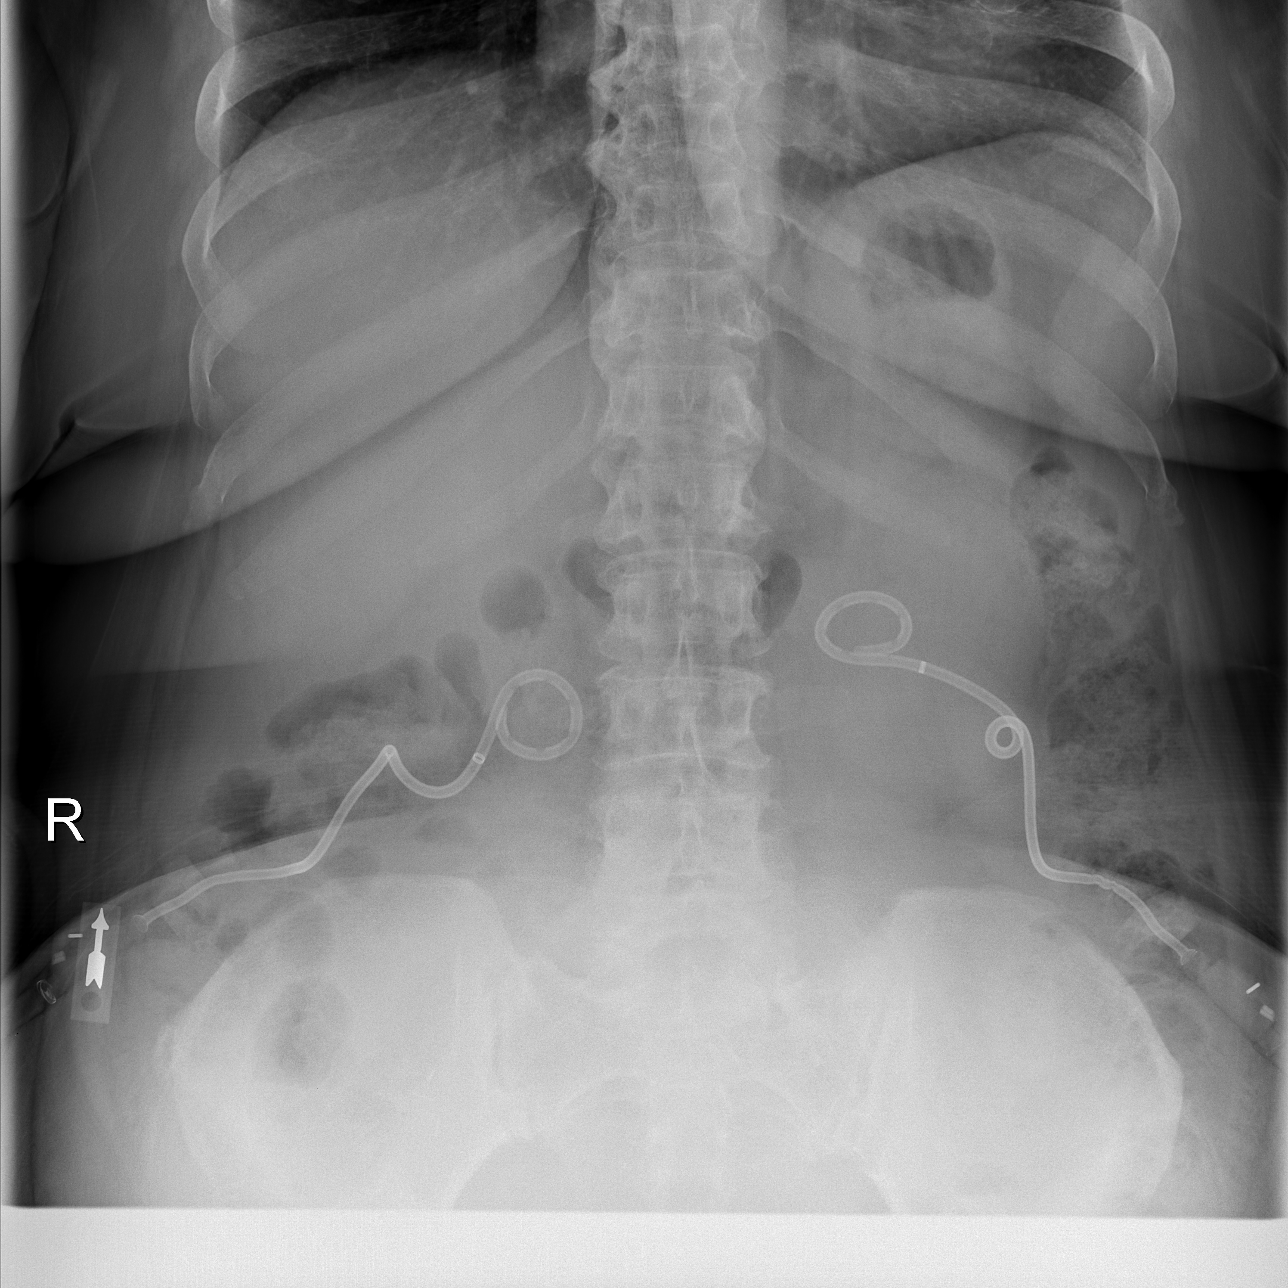

[t abdomen supine (1 of 2)]
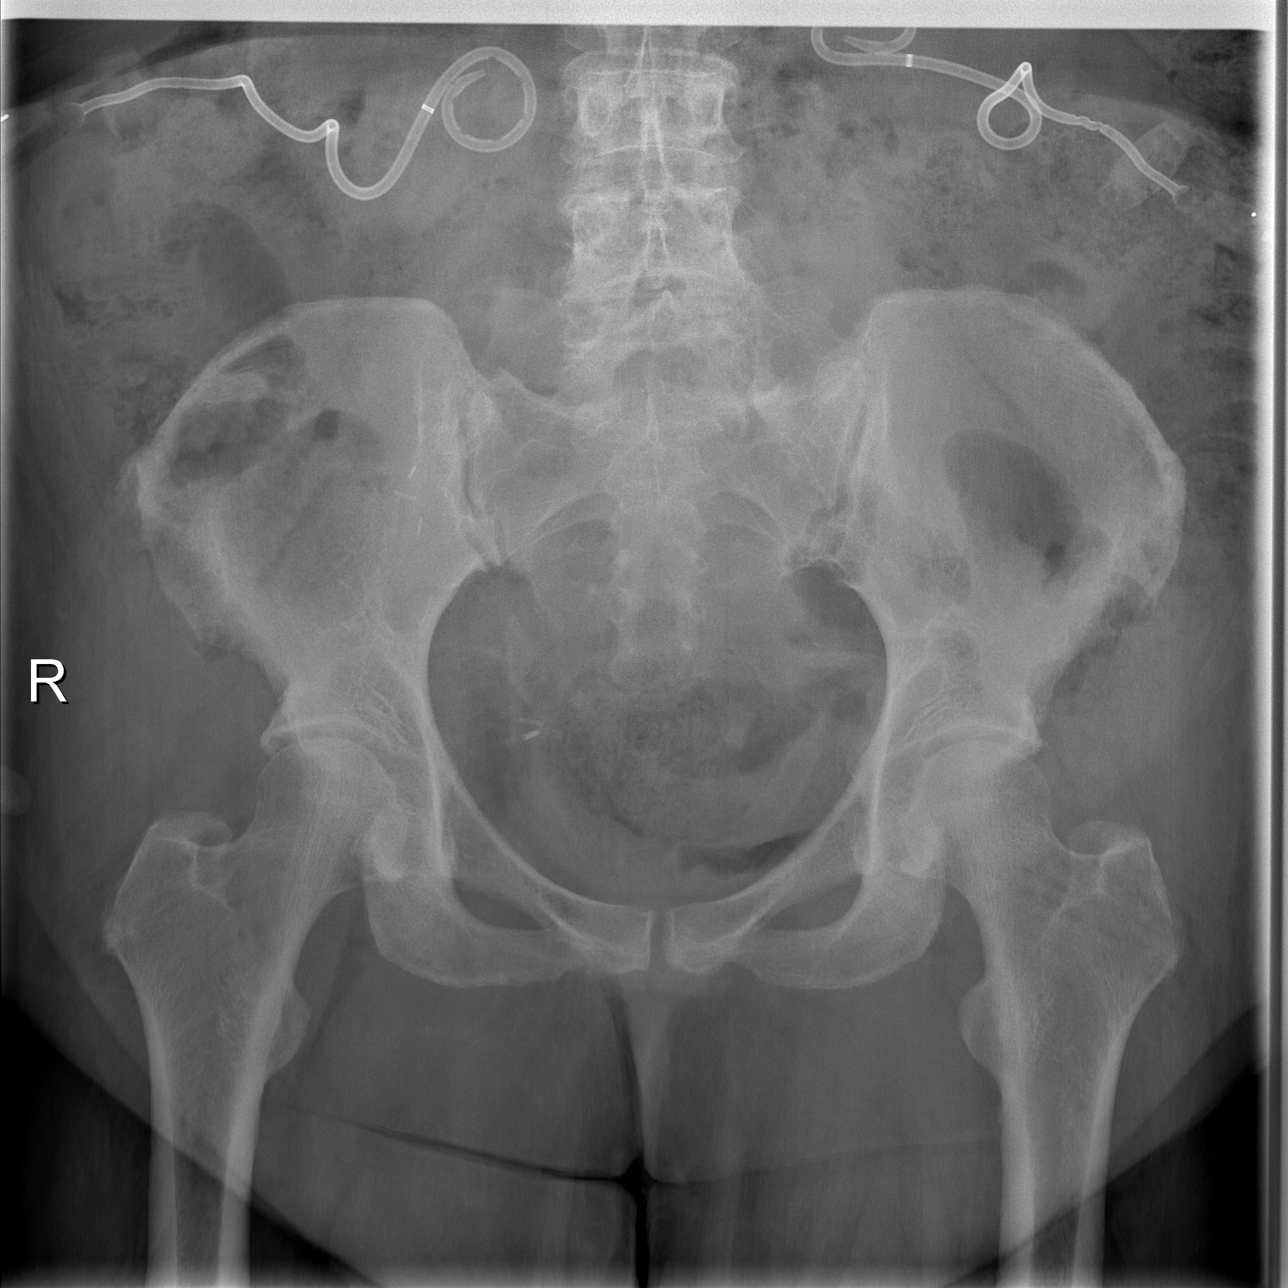

[t abdomen supine (2 of 2)]
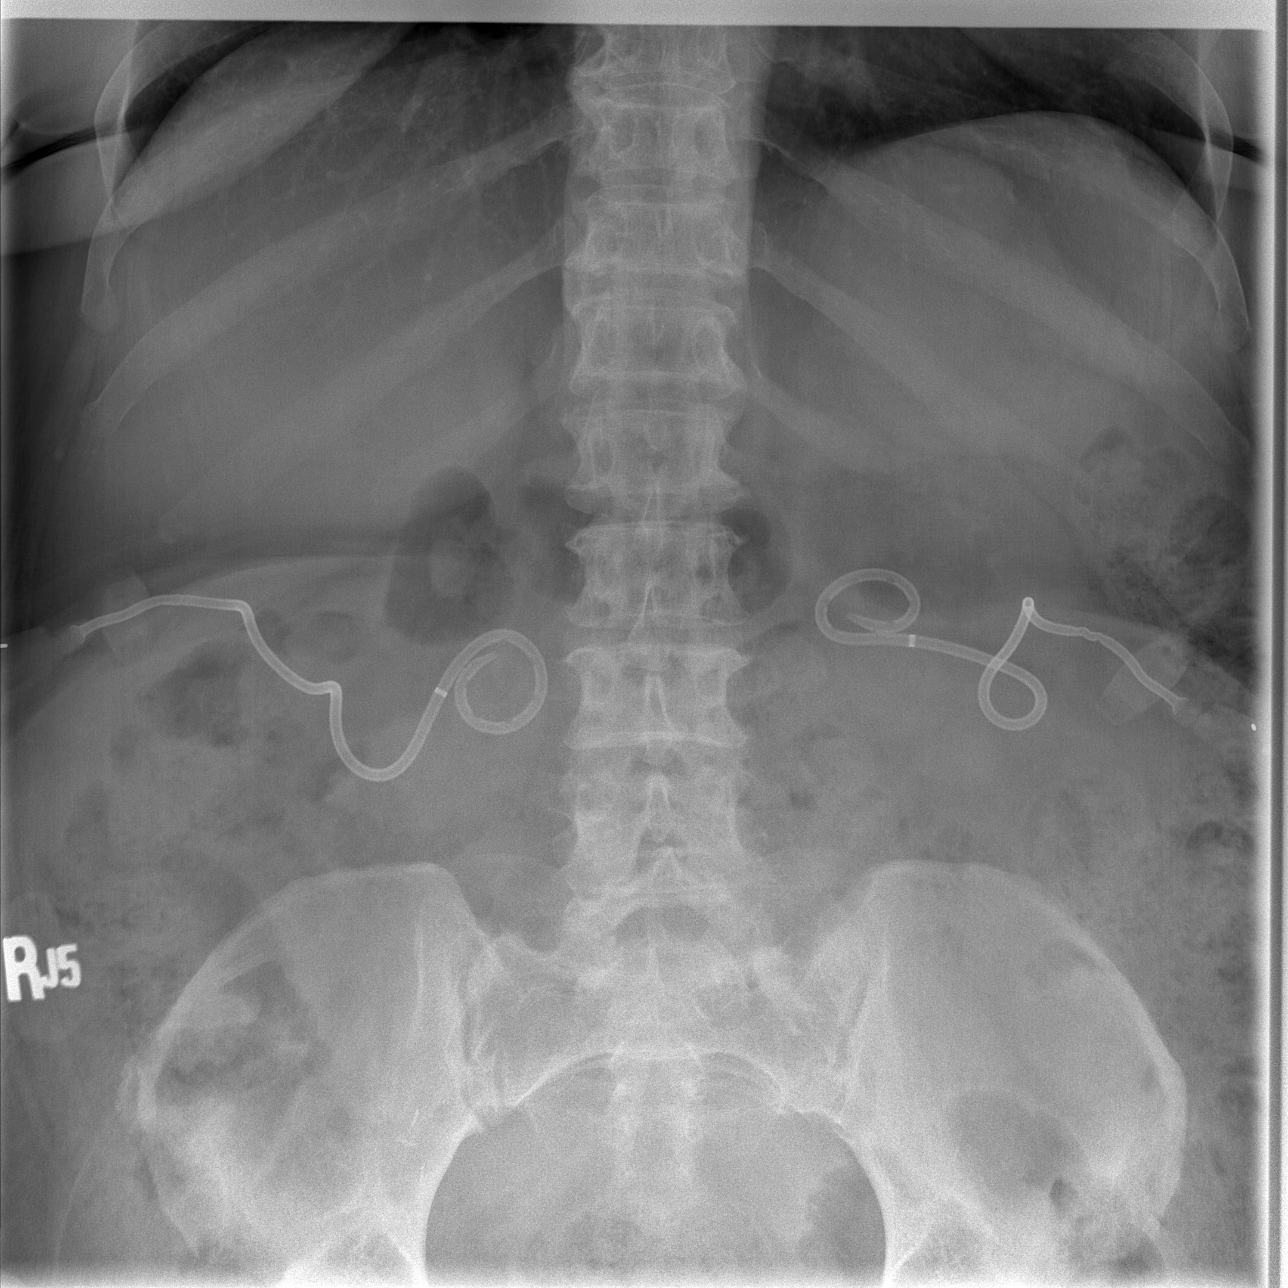

[3 of 3 positions shown; findings below may reference images not displayed]

FINDINGS: Bilateral percutaneous nephrostomies appear unchanged in
position.  There is no evidence of urinary tract calculus.
Moderate stool is present throughout the colon but the bowel gas
pattern is nonobstructive.  There is no free intraperitoneal air.
Surgical clips are present within the right pelvis.  Osseous
structures appear unchanged with mild lower lumbar spondylosis.
IMPRESSION: No demonstrated acute abdominal findings.  The percutaneous
nephrostomies appear stable in position.

## 2013-12-10 NOTE — Telephone Encounter (Signed)
Encounter was telephone call.
